# Patient Record
Sex: Male | Born: 1940
Health system: Southern US, Community
[De-identification: ages and names within clinical notes are randomized; demographics above are authoritative.]

## PROBLEM LIST (undated history)

## (undated) DIAGNOSIS — N402 Nodular prostate without lower urinary tract symptoms: Secondary | ICD-10-CM

## (undated) DIAGNOSIS — Z89619 Acquired absence of unspecified leg above knee: Secondary | ICD-10-CM

## (undated) DIAGNOSIS — B1920 Unspecified viral hepatitis C without hepatic coma: Secondary | ICD-10-CM

## (undated) DIAGNOSIS — S065X9A Traumatic subdural hemorrhage with loss of consciousness of unspecified duration, initial encounter: Secondary | ICD-10-CM

## (undated) DIAGNOSIS — Z87442 Personal history of urinary calculi: Secondary | ICD-10-CM

## (undated) DIAGNOSIS — Z8619 Personal history of other infectious and parasitic diseases: Secondary | ICD-10-CM

## (undated) DIAGNOSIS — Z955 Presence of coronary angioplasty implant and graft: Secondary | ICD-10-CM

## (undated) DIAGNOSIS — K254 Chronic or unspecified gastric ulcer with hemorrhage: Secondary | ICD-10-CM

## (undated) DIAGNOSIS — R945 Abnormal results of liver function studies: Secondary | ICD-10-CM

## (undated) DIAGNOSIS — Z720 Tobacco use: Secondary | ICD-10-CM

## (undated) DIAGNOSIS — I219 Acute myocardial infarction, unspecified: Secondary | ICD-10-CM

## (undated) DIAGNOSIS — I10 Essential (primary) hypertension: Secondary | ICD-10-CM

## (undated) DIAGNOSIS — I714 Abdominal aortic aneurysm, without rupture: Secondary | ICD-10-CM

## (undated) DIAGNOSIS — S301XXA Contusion of abdominal wall, initial encounter: Secondary | ICD-10-CM

## (undated) HISTORY — DX: Unspecified viral hepatitis C without hepatic coma: B19.20

## (undated) HISTORY — DX: Personal history of other infectious and parasitic diseases: Z86.19

## (undated) HISTORY — DX: Nodular prostate without lower urinary tract symptoms: N40.2

## (undated) HISTORY — DX: Abdominal aortic aneurysm, without rupture: I71.4

## (undated) HISTORY — DX: Traumatic subdural hemorrhage with loss of consciousness of unspecified duration, initial encounter: S06.5X9A

## (undated) HISTORY — DX: Personal history of urinary calculi: Z87.442

## (undated) HISTORY — DX: Tobacco use: Z72.0

## (undated) HISTORY — DX: Acute myocardial infarction, unspecified: I21.9

## (undated) HISTORY — DX: Chronic or unspecified gastric ulcer with hemorrhage: K25.4

---
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
## 1963-06-03 HISTORY — PX: ABOVE KNEE LEG AMPUTATION: SUR20

## 2005-04-22 ENCOUNTER — Ambulatory Visit: Payer: Self-pay | Admitting: Gastroenterology

## 2006-06-02 DIAGNOSIS — I714 Abdominal aortic aneurysm, without rupture, unspecified: Secondary | ICD-10-CM

## 2006-06-02 HISTORY — DX: Abdominal aortic aneurysm, without rupture, unspecified: I71.40

## 2006-06-02 HISTORY — DX: Abdominal aortic aneurysm, without rupture: I71.4

## 2007-06-03 HISTORY — PX: COLONOSCOPY: SHX174

## 2010-06-02 DIAGNOSIS — B1920 Unspecified viral hepatitis C without hepatic coma: Secondary | ICD-10-CM

## 2010-06-02 HISTORY — DX: Unspecified viral hepatitis C without hepatic coma: B19.20

## 2011-04-16 ENCOUNTER — Other Ambulatory Visit: Payer: Self-pay | Admitting: Family Medicine

## 2011-04-16 DIAGNOSIS — I719 Aortic aneurysm of unspecified site, without rupture: Secondary | ICD-10-CM

## 2011-04-18 ENCOUNTER — Ambulatory Visit
Admission: RE | Admit: 2011-04-18 | Discharge: 2011-04-18 | Disposition: A | Discharge status: Home / Self Care | Payer: Medicare Other | Source: Ambulatory Visit | Attending: Family Medicine | Admitting: Family Medicine

## 2011-04-18 DIAGNOSIS — I719 Aortic aneurysm of unspecified site, without rupture: Secondary | ICD-10-CM

## 2011-04-18 MED ORDER — IOHEXOL 300 MG/ML  SOLN
100.0000 mL | Freq: Once | INTRAMUSCULAR | Status: AC | PRN
  Administered 2011-04-18: 100 mL via INTRAVENOUS

## 2011-09-21 ENCOUNTER — Other Ambulatory Visit: Payer: Self-pay

## 2011-09-21 ENCOUNTER — Encounter (HOSPITAL_COMMUNITY)
Admission: EM | Disposition: A | Discharge status: Home / Self Care | Payer: Self-pay | Source: Home / Self Care | Attending: Cardiovascular Disease

## 2011-09-21 ENCOUNTER — Ambulatory Visit (HOSPITAL_COMMUNITY): Admit: 2011-09-21 | Payer: Self-pay | Admitting: Cardiovascular Disease

## 2011-09-21 ENCOUNTER — Encounter (HOSPITAL_COMMUNITY): Payer: Self-pay | Admitting: *Deleted

## 2011-09-21 ENCOUNTER — Inpatient Hospital Stay (HOSPITAL_COMMUNITY)
Admission: EM | Admit: 2011-09-21 | Discharge: 2011-09-27 | DRG: 246 | Disposition: A | Discharge status: Home / Self Care | Payer: Medicare Other | Attending: Cardiovascular Disease | Admitting: Cardiovascular Disease

## 2011-09-21 DIAGNOSIS — I2542 Coronary artery dissection: Secondary | ICD-10-CM | POA: Diagnosis not present

## 2011-09-21 DIAGNOSIS — R509 Fever, unspecified: Secondary | ICD-10-CM | POA: Diagnosis not present

## 2011-09-21 DIAGNOSIS — I2109 ST elevation (STEMI) myocardial infarction involving other coronary artery of anterior wall: Principal | ICD-10-CM | POA: Diagnosis present

## 2011-09-21 DIAGNOSIS — E785 Hyperlipidemia, unspecified: Secondary | ICD-10-CM | POA: Diagnosis present

## 2011-09-21 DIAGNOSIS — I959 Hypotension, unspecified: Secondary | ICD-10-CM | POA: Diagnosis not present

## 2011-09-21 DIAGNOSIS — F17200 Nicotine dependence, unspecified, uncomplicated: Secondary | ICD-10-CM | POA: Diagnosis not present

## 2011-09-21 DIAGNOSIS — I2510 Atherosclerotic heart disease of native coronary artery without angina pectoris: Secondary | ICD-10-CM | POA: Diagnosis not present

## 2011-09-21 DIAGNOSIS — Z7982 Long term (current) use of aspirin: Secondary | ICD-10-CM

## 2011-09-21 DIAGNOSIS — I10 Essential (primary) hypertension: Secondary | ICD-10-CM | POA: Diagnosis present

## 2011-09-21 DIAGNOSIS — IMO0002 Reserved for concepts with insufficient information to code with codable children: Secondary | ICD-10-CM | POA: Diagnosis not present

## 2011-09-21 DIAGNOSIS — I2582 Chronic total occlusion of coronary artery: Secondary | ICD-10-CM | POA: Diagnosis present

## 2011-09-21 DIAGNOSIS — Y9210 Unspecified residential institution as the place of occurrence of the external cause: Secondary | ICD-10-CM | POA: Diagnosis not present

## 2011-09-21 DIAGNOSIS — K219 Gastro-esophageal reflux disease without esophagitis: Secondary | ICD-10-CM | POA: Diagnosis present

## 2011-09-21 DIAGNOSIS — Z720 Tobacco use: Secondary | ICD-10-CM | POA: Diagnosis present

## 2011-09-21 DIAGNOSIS — E876 Hypokalemia: Secondary | ICD-10-CM | POA: Diagnosis not present

## 2011-09-21 DIAGNOSIS — R7309 Other abnormal glucose: Secondary | ICD-10-CM | POA: Diagnosis not present

## 2011-09-21 DIAGNOSIS — Z89619 Acquired absence of unspecified leg above knee: Secondary | ICD-10-CM

## 2011-09-21 DIAGNOSIS — Z79899 Other long term (current) drug therapy: Secondary | ICD-10-CM | POA: Diagnosis not present

## 2011-09-21 DIAGNOSIS — I724 Aneurysm of artery of lower extremity: Secondary | ICD-10-CM | POA: Diagnosis present

## 2011-09-21 DIAGNOSIS — R945 Abnormal results of liver function studies: Secondary | ICD-10-CM | POA: Diagnosis present

## 2011-09-21 DIAGNOSIS — S78119A Complete traumatic amputation at level between unspecified hip and knee, initial encounter: Secondary | ICD-10-CM

## 2011-09-21 DIAGNOSIS — S301XXA Contusion of abdominal wall, initial encounter: Secondary | ICD-10-CM

## 2011-09-21 DIAGNOSIS — I2589 Other forms of chronic ischemic heart disease: Secondary | ICD-10-CM | POA: Diagnosis present

## 2011-09-21 DIAGNOSIS — I255 Ischemic cardiomyopathy: Secondary | ICD-10-CM | POA: Diagnosis present

## 2011-09-21 DIAGNOSIS — I219 Acute myocardial infarction, unspecified: Secondary | ICD-10-CM | POA: Diagnosis not present

## 2011-09-21 DIAGNOSIS — I714 Abdominal aortic aneurysm, without rupture, unspecified: Secondary | ICD-10-CM | POA: Diagnosis present

## 2011-09-21 DIAGNOSIS — R066 Hiccough: Secondary | ICD-10-CM | POA: Diagnosis not present

## 2011-09-21 DIAGNOSIS — B1920 Unspecified viral hepatitis C without hepatic coma: Secondary | ICD-10-CM | POA: Diagnosis present

## 2011-09-21 DIAGNOSIS — I2119 ST elevation (STEMI) myocardial infarction involving other coronary artery of inferior wall: Secondary | ICD-10-CM | POA: Diagnosis not present

## 2011-09-21 DIAGNOSIS — Z955 Presence of coronary angioplasty implant and graft: Secondary | ICD-10-CM

## 2011-09-21 DIAGNOSIS — I213 ST elevation (STEMI) myocardial infarction of unspecified site: Secondary | ICD-10-CM

## 2011-09-21 DIAGNOSIS — R079 Chest pain, unspecified: Secondary | ICD-10-CM | POA: Diagnosis not present

## 2011-09-21 DIAGNOSIS — Y840 Cardiac catheterization as the cause of abnormal reaction of the patient, or of later complication, without mention of misadventure at the time of the procedure: Secondary | ICD-10-CM | POA: Diagnosis not present

## 2011-09-21 HISTORY — DX: Contusion of abdominal wall, initial encounter: S30.1XXA

## 2011-09-21 HISTORY — PX: PERCUTANEOUS CORONARY STENT INTERVENTION (PCI-S): SHX5485

## 2011-09-21 HISTORY — DX: Essential (primary) hypertension: I10

## 2011-09-21 HISTORY — DX: Abnormal results of liver function studies: R94.5

## 2011-09-21 HISTORY — DX: Presence of coronary angioplasty implant and graft: Z95.5

## 2011-09-21 HISTORY — DX: Acquired absence of unspecified leg above knee: Z89.619

## 2011-09-21 HISTORY — PX: LEFT HEART CATHETERIZATION WITH CORONARY ANGIOGRAM: SHX5451

## 2011-09-21 LAB — POCT ACTIVATED CLOTTING TIME: Activated Clotting Time: 144 seconds

## 2011-09-21 LAB — CARDIAC PANEL(CRET KIN+CKTOT+MB+TROPI)
CK, MB: 8.6 ng/mL (ref 0.3–4.0)
Total CK: 142 U/L (ref 7–232)
Troponin I: 0.75 ng/mL (ref <0.30)

## 2011-09-21 LAB — BASIC METABOLIC PANEL
CO2: 24 mEq/L (ref 19–32)
Calcium: 9.4 mg/dL (ref 8.4–10.5)
Creatinine, Ser: 1 mg/dL (ref 0.50–1.35)
GFR calc Af Amer: 85 mL/min — ABNORMAL LOW (ref >90)
GFR calc non Af Amer: 74 mL/min — ABNORMAL LOW (ref >90)
Sodium: 140 mEq/L (ref 135–145)

## 2011-09-21 LAB — CBC
Platelets: 149 10*3/uL — ABNORMAL LOW (ref 150–400)
RBC: 4.76 MIL/uL (ref 4.22–5.81)
RDW: 14.4 % (ref 11.5–15.5)
WBC: 6.6 10*3/uL (ref 4.0–10.5)

## 2011-09-21 LAB — MAGNESIUM: Magnesium: 1.9 mg/dL (ref 1.5–2.5)

## 2011-09-21 SURGERY — LEFT HEART CATHETERIZATION WITH CORONARY ANGIOGRAM
Anesthesia: LOCAL

## 2011-09-21 MED ORDER — MIDAZOLAM HCL 2 MG/2ML IJ SOLN
INTRAMUSCULAR | Status: AC
  Filled 2011-09-21: qty 2

## 2011-09-21 MED ORDER — MORPHINE SULFATE 2 MG/ML IJ SOLN
INTRAMUSCULAR | Status: AC
  Administered 2011-09-21: 2 mg via INTRAVENOUS
  Filled 2011-09-21: qty 1

## 2011-09-21 MED ORDER — SODIUM CHLORIDE 0.9 % IV SOLN
INTRAVENOUS | Status: DC
  Administered 2011-09-21 – 2011-09-22 (×2): via INTRAVENOUS

## 2011-09-21 MED ORDER — NITROGLYCERIN IN D5W 200-5 MCG/ML-% IV SOLN
2.0000 ug/min | INTRAVENOUS | Status: DC
  Administered 2011-09-21: 40 ug/min via INTRAVENOUS
  Filled 2011-09-21: qty 250

## 2011-09-21 MED ORDER — TICAGRELOR 90 MG PO TABS
ORAL_TABLET | ORAL | Status: AC
  Filled 2011-09-21: qty 2

## 2011-09-21 MED ORDER — ALPRAZOLAM 0.25 MG PO TABS
0.2500 mg | ORAL_TABLET | Freq: Three times a day (TID) | ORAL | Status: DC | PRN
  Administered 2011-09-22: 0.25 mg via ORAL
  Filled 2011-09-21: qty 1

## 2011-09-21 MED ORDER — PANTOPRAZOLE SODIUM 40 MG PO TBEC
40.0000 mg | DELAYED_RELEASE_TABLET | Freq: Every day | ORAL | Status: DC
  Administered 2011-09-22: 40 mg via ORAL
  Filled 2011-09-21: qty 1

## 2011-09-21 MED ORDER — CARVEDILOL 3.125 MG PO TABS
3.1250 mg | ORAL_TABLET | Freq: Two times a day (BID) | ORAL | Status: DC
  Administered 2011-09-22 – 2011-09-23 (×4): 3.125 mg via ORAL
  Filled 2011-09-21 (×7): qty 1

## 2011-09-21 MED ORDER — ASPIRIN EC 81 MG PO TBEC: 81.0000 mg | DELAYED_RELEASE_TABLET | Freq: Every day | ORAL | Status: DC

## 2011-09-21 MED ORDER — LIDOCAINE HCL (PF) 1 % IJ SOLN
INTRAMUSCULAR | Status: AC
  Filled 2011-09-21: qty 30

## 2011-09-21 MED ORDER — NITROGLYCERIN IN D5W 200-5 MCG/ML-% IV SOLN
INTRAVENOUS | Status: AC
  Filled 2011-09-21: qty 250

## 2011-09-21 MED ORDER — EPTIFIBATIDE 75 MG/100ML IV SOLN
2.0000 ug/kg/min | INTRAVENOUS | Status: AC
  Administered 2011-09-21 – 2011-09-22 (×3): 2 ug/kg/min via INTRAVENOUS
  Filled 2011-09-21 (×6): qty 100

## 2011-09-21 MED ORDER — ATORVASTATIN CALCIUM 40 MG PO TABS
40.0000 mg | ORAL_TABLET | Freq: Every day | ORAL | Status: DC
  Administered 2011-09-22 – 2011-09-26 (×5): 40 mg via ORAL
  Filled 2011-09-21 (×6): qty 1

## 2011-09-21 MED ORDER — HEPARIN (PORCINE) IN NACL 2-0.9 UNIT/ML-% IJ SOLN
INTRAMUSCULAR | Status: AC
  Filled 2011-09-21: qty 2000

## 2011-09-21 MED ORDER — ACETAMINOPHEN 325 MG PO TABS
650.0000 mg | ORAL_TABLET | ORAL | Status: DC | PRN
  Administered 2011-09-23 – 2011-09-24 (×2): 650 mg via ORAL
  Filled 2011-09-21 (×2): qty 2

## 2011-09-21 MED ORDER — HEPARIN SODIUM (PORCINE) 5000 UNIT/ML IJ SOLN
INTRAMUSCULAR | Status: AC
  Administered 2011-09-21: 15:00:00
  Filled 2011-09-21: qty 1

## 2011-09-21 MED ORDER — BIVALIRUDIN 250 MG IV SOLR
INTRAVENOUS | Status: AC
  Filled 2011-09-21: qty 250

## 2011-09-21 MED ORDER — MORPHINE SULFATE 2 MG/ML IJ SOLN
2.0000 mg | INTRAMUSCULAR | Status: DC | PRN
  Administered 2011-09-21 – 2011-09-22 (×2): 2 mg via INTRAVENOUS
  Filled 2011-09-21 (×2): qty 1

## 2011-09-21 MED ORDER — NITROGLYCERIN 0.2 MG/ML ON CALL CATH LAB
INTRAVENOUS | Status: AC
  Filled 2011-09-21: qty 1

## 2011-09-21 MED ORDER — ADENOSINE 6 MG/2ML IV SOLN
INTRAVENOUS | Status: AC
  Filled 2011-09-21: qty 2

## 2011-09-21 MED ORDER — FENTANYL CITRATE 0.05 MG/ML IJ SOLN
INTRAMUSCULAR | Status: AC
  Filled 2011-09-21: qty 2

## 2011-09-21 MED ORDER — ATROPINE SULFATE 1 MG/ML IJ SOLN
INTRAMUSCULAR | Status: AC
  Filled 2011-09-21: qty 1

## 2011-09-21 MED ORDER — ONDANSETRON HCL 4 MG/2ML IJ SOLN
4.0000 mg | Freq: Four times a day (QID) | INTRAMUSCULAR | Status: DC | PRN
  Administered 2011-09-21: 4 mg via INTRAVENOUS
  Filled 2011-09-21: qty 2

## 2011-09-21 MED ORDER — ZOLPIDEM TARTRATE 5 MG PO TABS
5.0000 mg | ORAL_TABLET | Freq: Every evening | ORAL | Status: DC | PRN
  Administered 2011-09-22: 5 mg via ORAL
  Filled 2011-09-21: qty 1

## 2011-09-21 MED ORDER — MORPHINE SULFATE 2 MG/ML IJ SOLN: 2.0000 mg | Freq: Once | INTRAMUSCULAR | Status: DC

## 2011-09-21 MED ORDER — EPTIFIBATIDE 75 MG/100ML IV SOLN
INTRAVENOUS | Status: AC
  Administered 2011-09-22: 2 ug/kg/min via INTRAVENOUS
  Filled 2011-09-21: qty 100

## 2011-09-21 MED ORDER — ATORVASTATIN CALCIUM 80 MG PO TABS: 80.0000 mg | ORAL_TABLET | Freq: Every day | ORAL | Status: DC

## 2011-09-21 MED ORDER — TICAGRELOR 90 MG PO TABS
90.0000 mg | ORAL_TABLET | Freq: Two times a day (BID) | ORAL | Status: DC
  Administered 2011-09-21: 90 mg via ORAL
  Filled 2011-09-21 (×2): qty 1

## 2011-09-21 MED ORDER — ADULT MULTIVITAMIN W/MINERALS CH
1.0000 | ORAL_TABLET | Freq: Every day | ORAL | Status: DC
  Administered 2011-09-22 – 2011-09-27 (×6): 1 via ORAL
  Filled 2011-09-21 (×7): qty 1

## 2011-09-21 MED ORDER — ASPIRIN EC 81 MG PO TBEC
81.0000 mg | DELAYED_RELEASE_TABLET | Freq: Every day | ORAL | Status: DC
  Administered 2011-09-21 – 2011-09-26 (×6): 81 mg via ORAL
  Filled 2011-09-21 (×7): qty 1

## 2011-09-21 MED ORDER — POTASSIUM CHLORIDE 10 MEQ/100ML IV SOLN
10.0000 meq | INTRAVENOUS | Status: AC
  Administered 2011-09-21 (×4): 10 meq via INTRAVENOUS
  Filled 2011-09-21 (×4): qty 100

## 2011-09-21 NOTE — ED Provider Notes (Signed)
History     CSN: [id number]  Arrival date & time 09/21/11  37   First MD Initiated Contact with Patient 09/21/11 1432      Chief Complaint  Patient presents with  . Code STEMI    (Consider location/radiation/quality/duration/timing/severity/associated sxs/prior treatment) The history is provided by the patient.   71 year old male had onset about 30 minutes ago of moderately severe, dull midsternal chest pain without radiation. There was no associated dyspnea, nausea, vomiting, or diaphoresis. There is no radiation of pain. He has not had pain like this before. Nothing makes it better nothing makes it worse. He does have a history of an abdominal aortic aneurysm but is a nonsmoker and does not have history of diabetes you. He is treated for hypertension. He took 4 baby aspirin at home before coming into the emergency department.  Past Medical History  Diagnosis Date  . Acid reflux   . Hypertension     History reviewed. No pertinent past surgical history.  History reviewed. No pertinent family history.  History  Substance Use Topics  . Smoking status: Current Everyday Smoker    Types: Cigars  . Smokeless tobacco: Not on file  . Alcohol Use: No      Review of Systems  All other systems reviewed and are negative.    Allergies  Review of patient's allergies indicates no known allergies.  Home Medications   Current Outpatient Rx  Name Route Sig Dispense Refill  . ASPIRIN EC 81 MG PO TBEC Oral Take 81 mg by mouth at bedtime.    . ADULT MULTIVITAMIN W/MINERALS CH Oral Take 1 tablet by mouth daily.    . NEBIVOLOL HCL 5 MG PO TABS Oral Take 5 mg by mouth daily.    [name] OMEPRAZOLE 20 MG PO CPDR Oral Take 20 mg by mouth daily.      BP 161/88  Pulse 66  Temp(Src) 97.7 F (36.5 C) (Oral)  Resp 18  SpO2 100%  Physical Exam  Nursing note and vitals reviewed.  71 year old male who is resting comfortably and in no acute distress. Vital signs are significant for moderate  hypertension with blood pressure 161/88. Oxygen saturation is 100% which is normal. Head is normocephalic and atraumatic. PERRLA, EOMI. Oropharynx is clear. Neck is nontender and supple. Back is nontender. Lungs are clear without rales, wheezes, rhonchi. Heart has regular rate rhythm without murmur. Abdomen is soft, flat, nontender without masses or hepatosplenomegaly. Extremities: He is status post right above-the-knee amputation. No cyanosis or edema. Skin has some areas of erythema and scaling consistent with psoriasis. Neurologic: Mental status is normal, cranial nerves are intact, there no focal motor or sensory deficits.  ED Course  Procedures (including critical care time)  Labs Reviewed - No data to display No results found.   Date: 09/21/2011  Rate: 64  Rhythm: normal sinus rhythm  QRS Axis: normal  Intervals: normal  ST/T Wave abnormalities: ST elevations anteriorly and ST elevations laterally  Conduction Disutrbances:right bundle branch block  Narrative Interpretation:   Old EKG Reviewed: none available    1. STEMI (ST elevation myocardial infarction)    CRITICAL CARE Performed by: [id number]   Total critical care time: 35 minutes  Critical care time was exclusive of separately billable procedures and treating other patients.  Critical care was necessary to treat or prevent imminent or life-threatening deterioration.  Critical care was time spent personally by me on the following activities: development of treatment plan with patient and/or surrogate as well as nursing,  discussions with consultants, evaluation of patient's response to treatment, examination of patient, obtaining history from patient or surrogate, ordering and performing treatments and interventions, ordering and review of laboratory studies, ordering and review of radiographic studies, pulse oximetry and re-evaluation of patient's condition.    MDM  Acute STEMI. Code STEMI was activated. Dr. Claiborne Billings has  been by to see the patient in he was taken directly up to the cath lab. Prior to transfer to the cath lab, he was given heparin IV bolus. Monitor showed frequent PVCs but no other arrhythmias.      Delora Fuel, MD [id number]

## 2011-09-21 NOTE — ED Notes (Signed)
Patient to room 17,  Cardiology dr Claiborne Billings has been to bedside with ermd.  Patient undressed, iv access obtained.  Patient placed on zoll and cardiac monitoring.  Wife at bedside and aware of plan of care.

## 2011-09-21 NOTE — Progress Notes (Signed)
Chaplain responded to a Code STEMI. Chaplain escorted patient's wife to a consultation and offered her emotional support. Patient's wife stated that she is coping okay. Chaplain also connected patient's wife with medical staff. Follow up as needed.

## 2011-09-21 NOTE — CV Procedure (Addendum)
Emergent cath/ Very difficult PCI/PTCA and multiple stents  STEMI Anterior  Stephen [name], 71 y.o., male  See dictated note and diagram in chart.  LM: short nl LAD;total occlusion post Dx1 and septal, [place] ) flow LCX large dominent RCA: diffuse disease, 50 - 70%  Ao: 129/72 LV: 129/6/18  Very difficult PCI LAD. Once occlusion opened, multiple stenosis.  Initial 2.75 x22 Resolute stent inserted at site post dilated to 3.0.  Distal dissection near apex noted difficulty reaching distal aspect but stented with 2.25 x 22 Resolute stent. Due to dissection extending antegrade required stenting with 2 additional 2.75 mm x 22 Resolute stents.  All stents post dilated but flow remained decreased.   Adenosine given at 24 and 36 micrograms.  Flow remained decreased.  Integrelin started.  Will continue integrelin for 24 - 36 hrs. Medical therapy.  DICTATION [id number]  , [id number]  Troy Sine, MD, [facility] 09/21/2011 7:21 PM

## 2011-09-21 NOTE — Progress Notes (Signed)
ANTICOAGULATION CONSULT NOTE - Initial Consult  Pharmacy Consult for Integrilin Indication: s/p PTCA LAD requiring multiple stents  No Known Allergies  Patient Measurements: Weight: 149 lb 14.6 oz (68 kg)  Vital Signs: Temp: 97.7 F (36.5 C) (04/21 1426) Temp src: Oral (04/21 1426) BP: 161/88 mmHg (04/21 1450) Pulse Rate: 96  (04/21 1507)  Labs:  Basename 09/21/11 1737 09/21/11 1730  HGB -- 16.3  HCT -- 45.8  PLT -- 149*  APTT -- --  LABPROT -- --  INR -- --  HEPARINUNFRC -- --  CREATININE -- 1.00  CKTOTAL 142 --  CKMB 8.6* --  TROPONINI 0.75* --   Creat Clearance ~ 70 ml/min  Medical History: Past Medical History  Diagnosis Date  . Acid reflux   . Hypertension     Medications:  Prescriptions prior to admission  Medication Sig Dispense Refill  . aspirin EC 81 MG tablet Take 81 mg by mouth at bedtime.      . Multiple Vitamin (MULITIVITAMIN WITH MINERALS) TABS Take 1 tablet by mouth daily.      . nebivolol (BYSTOLIC) 5 MG tablet Take 5 mg by mouth daily.      [name] omeprazole (PRILOSEC) 20 MG capsule Take 20 mg by mouth daily.        Assessment: 71 y.o. male STEMI s/p emergent PTCA of LAD requiring multiple stents. Integrilin x 36hr per Rx post PTCA LAD-required multiple stents. Also on ticagrelor BID and ASA 81mg . Hgb 16.3. Plt 149.   Plan:  1. Continue integrilin at 2 mcg/kg/min x 36 hours 2. F/u a.m. platelet  Sherlon Handing, PharmD, BCPS Clinical pharmacist, pager [phone] 09/21/2011,7:51 PM
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
## 2011-09-21 NOTE — ED Notes (Signed)
Pt had onset of chest pain 20 mins ago. ekg done at triage, code STEMI called. No resp distress noted, skin w/d.

## 2011-09-22 ENCOUNTER — Encounter (HOSPITAL_COMMUNITY): Payer: Self-pay | Admitting: Cardiology

## 2011-09-22 ENCOUNTER — Inpatient Hospital Stay (HOSPITAL_COMMUNITY): Payer: Medicare Other

## 2011-09-22 DIAGNOSIS — I2510 Atherosclerotic heart disease of native coronary artery without angina pectoris: Secondary | ICD-10-CM | POA: Diagnosis not present

## 2011-09-22 DIAGNOSIS — Z955 Presence of coronary angioplasty implant and graft: Secondary | ICD-10-CM

## 2011-09-22 DIAGNOSIS — I2119 ST elevation (STEMI) myocardial infarction involving other coronary artery of inferior wall: Secondary | ICD-10-CM | POA: Diagnosis not present

## 2011-09-22 DIAGNOSIS — Z720 Tobacco use: Secondary | ICD-10-CM

## 2011-09-22 DIAGNOSIS — F17200 Nicotine dependence, unspecified, uncomplicated: Secondary | ICD-10-CM | POA: Diagnosis not present

## 2011-09-22 DIAGNOSIS — K219 Gastro-esophageal reflux disease without esophagitis: Secondary | ICD-10-CM | POA: Diagnosis present

## 2011-09-22 DIAGNOSIS — Z89619 Acquired absence of unspecified leg above knee: Secondary | ICD-10-CM

## 2011-09-22 DIAGNOSIS — I219 Acute myocardial infarction, unspecified: Secondary | ICD-10-CM | POA: Diagnosis not present

## 2011-09-22 DIAGNOSIS — I724 Aneurysm of artery of lower extremity: Secondary | ICD-10-CM | POA: Diagnosis not present

## 2011-09-22 DIAGNOSIS — I2542 Coronary artery dissection: Secondary | ICD-10-CM | POA: Diagnosis not present

## 2011-09-22 DIAGNOSIS — IMO0002 Reserved for concepts with insufficient information to code with codable children: Secondary | ICD-10-CM | POA: Diagnosis not present

## 2011-09-22 DIAGNOSIS — R7989 Other specified abnormal findings of blood chemistry: Secondary | ICD-10-CM

## 2011-09-22 DIAGNOSIS — I2109 ST elevation (STEMI) myocardial infarction involving other coronary artery of anterior wall: Secondary | ICD-10-CM | POA: Diagnosis not present

## 2011-09-22 DIAGNOSIS — R945 Abnormal results of liver function studies: Secondary | ICD-10-CM

## 2011-09-22 DIAGNOSIS — S301XXA Contusion of abdominal wall, initial encounter: Secondary | ICD-10-CM

## 2011-09-22 HISTORY — DX: Acquired absence of unspecified leg above knee: Z89.619

## 2011-09-22 HISTORY — DX: Abnormal results of liver function studies: R94.5

## 2011-09-22 HISTORY — DX: Tobacco use: Z72.0

## 2011-09-22 HISTORY — DX: Presence of coronary angioplasty implant and graft: Z95.5

## 2011-09-22 HISTORY — DX: Other specified abnormal findings of blood chemistry: R79.89

## 2011-09-22 HISTORY — DX: Contusion of abdominal wall, initial encounter: S30.1XXA

## 2011-09-22 LAB — HEPATIC FUNCTION PANEL
ALT: 106 U/L — ABNORMAL HIGH (ref 0–53)
AST: 418 U/L — ABNORMAL HIGH (ref 0–37)
Albumin: 3.3 g/dL — ABNORMAL LOW (ref 3.5–5.2)
Alkaline Phosphatase: 73 U/L (ref 39–117)
Bilirubin, Direct: 0.2 mg/dL (ref 0.0–0.3)
Total Bilirubin: 0.5 mg/dL (ref 0.3–1.2)

## 2011-09-22 LAB — BASIC METABOLIC PANEL
CO2: 22 mEq/L (ref 19–32)
Calcium: 8.4 mg/dL (ref 8.4–10.5)
Creatinine, Ser: 0.9 mg/dL (ref 0.50–1.35)
GFR calc non Af Amer: 84 mL/min — ABNORMAL LOW (ref >90)

## 2011-09-22 LAB — CBC
HCT: 35 % — ABNORMAL LOW (ref 39.0–52.0)
Hemoglobin: 12.6 g/dL — ABNORMAL LOW (ref 13.0–17.0)
MCH: 33.4 pg (ref 26.0–34.0)
MCH: 33.3 pg (ref 26.0–34.0)
MCHC: 35.9 g/dL (ref 30.0–36.0)
MCHC: 36 g/dL (ref 30.0–36.0)
MCV: 93.1 fL (ref 78.0–100.0)
MCV: 92.6 fL (ref 78.0–100.0)
Platelets: 142 10*3/uL — ABNORMAL LOW (ref 150–400)
Platelets: 128 10*3/uL — ABNORMAL LOW (ref 150–400)
RBC: 4.22 MIL/uL (ref 4.22–5.81)
RBC: 3.78 MIL/uL — ABNORMAL LOW (ref 4.22–5.81)
RDW: 14.4 % (ref 11.5–15.5)
WBC: 7.7 10*3/uL (ref 4.0–10.5)

## 2011-09-22 LAB — CARDIAC PANEL(CRET KIN+CKTOT+MB+TROPI): Total CK: 2637 U/L — ABNORMAL HIGH (ref 7–232)

## 2011-09-22 LAB — POCT ACTIVATED CLOTTING TIME: Activated Clotting Time: 766 seconds

## 2011-09-22 LAB — TSH: TSH: 0.194 u[IU]/mL — ABNORMAL LOW (ref 0.350–4.500)

## 2011-09-22 LAB — HEMOGLOBIN A1C: Hgb A1c MFr Bld: 5.1 % (ref <5.7)

## 2011-09-22 MED ORDER — SODIUM CHLORIDE 0.9 % IV SOLN
INTRAVENOUS | Status: DC
  Administered 2011-09-22: 22:00:00 via INTRAVENOUS

## 2011-09-22 MED ORDER — ALUM & MAG HYDROXIDE-SIMETH 200-200-20 MG/5ML PO SUSP
30.0000 mL | Freq: Once | ORAL | Status: AC
  Administered 2011-09-22: 30 mL via ORAL
  Filled 2011-09-22: qty 30

## 2011-09-22 MED ORDER — TICAGRELOR 90 MG PO TABS
90.0000 mg | ORAL_TABLET | Freq: Two times a day (BID) | ORAL | Status: DC
  Administered 2011-09-22 – 2011-09-27 (×11): 90 mg via ORAL
  Filled 2011-09-22 (×13): qty 1

## 2011-09-22 MED ORDER — PANTOPRAZOLE SODIUM 40 MG PO TBEC
40.0000 mg | DELAYED_RELEASE_TABLET | Freq: Two times a day (BID) | ORAL | Status: DC
  Administered 2011-09-23 – 2011-09-27 (×9): 40 mg via ORAL
  Filled 2011-09-22 (×9): qty 1

## 2011-09-22 MED FILL — Dextrose Inj 5%: INTRAVENOUS | Qty: 50 | Status: AC
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
## 2011-09-22 NOTE — Progress Notes (Signed)
Pressure drsg removed from Lt femoral site. Marland KitchenApprox 5cm x 8cm hematoma noted w/fresh bloody drainage from gaping incision .Manual pressure held over and above the hematoma x 30 min. Incision covered w/rolled 2x2 gauze, then the entire bruised area(outlined now w/black marker) covered tightly w/3 Lg tegaderm .Another Lg tegaderm placed over outpouched(yet very soft) area between the femoral site incision and the scrotum.Charge nurse made aware. Maalox given to Pt for c/o freq burping which shakes his abd .Small rolled blanket still at Pt's side( for splinting site before coughing).Reassurance provided.Pt pleasant and conversive.Pt to be monitored q 10- 15 min .

## 2011-09-22 NOTE — Progress Notes (Signed)
  Echocardiogram 2D Echocardiogram has been performed.  Stephen [name] Deneen 09/22/2011, 10:21 AM

## 2011-09-22 NOTE — Progress Notes (Signed)
Cardiac Rehab 1210 Noted trop elevated and watching groin. Discussed with pt to have wife bring prosthesis to hosp as it is not here. Will followup tomorrow to see if pt ready to begin ambulation. Sheriff Rodenberg DunlapRN

## 2011-09-22 NOTE — Progress Notes (Signed)
Pt smokes cigars and doesn't believe cigars can be inhaled. Discussed the role of light smoking and 2nd hand smoke as well as surgeon general's warning about there being no safe level of exposure to cigar or cigarette smoke and that if he's breathing he's inhaling. Pt in precontemplation stage and refused written education/contact information.

## 2011-09-22 NOTE — Progress Notes (Signed)
Lt femoral arterial sheath d/c'd per protocol.The patient tol well . Manual pressure held x 30 min then pressure drsg applied. Plan of care ,activity and routine at-home-care of site reviewed w/Pt . Pt declined to watch cardiac cath and other teaching videos this shift . Pt again reminded not to lift or reposition hips or LLE x 6 hrs .Dark bruise,approx 1.5 cm wide x .5 cm vertically, around gaping incision .Skin to Lt femoral site appears supple w/no additional bruising .

## 2011-09-22 NOTE — Progress Notes (Signed)
Jana Half made aware or Lt femoral site assessment. Order for stat CBC received.

## 2011-09-22 NOTE — Progress Notes (Signed)
   CARE MANAGEMENT NOTE 09/22/2011  Patient:  Stephen [name], Stephen [name]   Account Number:  [id number]  Date Initiated:  09/22/2011  Documentation initiated by:  GRAVES-BIGELOW,Shanah Guimaraes  Subjective/Objective Assessment:   Pt admitted with CP-STEMI-SP urgent cath-  s/p PTCA LAD requiring multiple stents. Pt is from home with family support.  Pt plan for d/c on brilinta when stable. CM to assist with medication.     Action/Plan:   CM did give pt 30 day free brilinta card. Pt uses [facility] on [address] medication is available. MD please write Rx for 30 day free no refills along with original Rx with refills. Thanks   Anticipated DC Date:  09/24/2011   Anticipated DC Plan:  [place]  CM consult  Medication Assistance      Choice offered to / List presented to:             Status of service:  Completed, signed off Medicare Important Message given?   (If response is "NO", the following Medicare IM given date fields will be blank) Date Medicare IM given:   Date Additional Medicare IM given:    Discharge Disposition:    Per UR Regulation:    If discussed at Long Length of Stay Meetings, dates discussed:    Comments:  09-22-11 1208 Jacqlyn Krauss, RN,BSN [phone] Benefits check in process- will make pt aware when complete.

## 2011-09-22 NOTE — Progress Notes (Signed)
UR Completed. Simmons, Kierrah Kilbride F [phone]  

## 2011-09-22 NOTE — Plan of Care (Signed)
Problem: Consults Goal: Chest Pain Patient Education (See Patient Education module for education specifics.) Outcome: Progressing Declined videos last noc  Problem: Phase I Progression Outcomes Goal: Anginal pain relieved Outcome: Progressing NTG drip at 5/mcg/min

## 2011-09-23 ENCOUNTER — Encounter (HOSPITAL_COMMUNITY): Payer: Self-pay

## 2011-09-23 DIAGNOSIS — E785 Hyperlipidemia, unspecified: Secondary | ICD-10-CM | POA: Diagnosis present

## 2011-09-23 DIAGNOSIS — I219 Acute myocardial infarction, unspecified: Secondary | ICD-10-CM | POA: Diagnosis not present

## 2011-09-23 DIAGNOSIS — IMO0002 Reserved for concepts with insufficient information to code with codable children: Secondary | ICD-10-CM | POA: Diagnosis not present

## 2011-09-23 DIAGNOSIS — I2542 Coronary artery dissection: Secondary | ICD-10-CM | POA: Diagnosis not present

## 2011-09-23 DIAGNOSIS — I2510 Atherosclerotic heart disease of native coronary artery without angina pectoris: Secondary | ICD-10-CM | POA: Diagnosis not present

## 2011-09-23 DIAGNOSIS — I255 Ischemic cardiomyopathy: Secondary | ICD-10-CM | POA: Diagnosis present

## 2011-09-23 DIAGNOSIS — I2119 ST elevation (STEMI) myocardial infarction involving other coronary artery of inferior wall: Secondary | ICD-10-CM | POA: Diagnosis not present

## 2011-09-23 DIAGNOSIS — I2109 ST elevation (STEMI) myocardial infarction involving other coronary artery of anterior wall: Secondary | ICD-10-CM | POA: Diagnosis not present

## 2011-09-23 LAB — LIPID PANEL
Cholesterol: 153 mg/dL (ref 0–200)
LDL Cholesterol: 105 mg/dL — ABNORMAL HIGH (ref 0–99)
Total CHOL/HDL Ratio: 4.5 RATIO
VLDL: 14 mg/dL (ref 0–40)

## 2011-09-23 LAB — CBC
HCT: 33.8 % — ABNORMAL LOW (ref 39.0–52.0)
Hemoglobin: 12 g/dL — ABNORMAL LOW (ref 13.0–17.0)
RBC: 3.57 MIL/uL — ABNORMAL LOW (ref 4.22–5.81)
WBC: 7.3 10*3/uL (ref 4.0–10.5)

## 2011-09-23 LAB — HEPATIC FUNCTION PANEL
ALT: 76 U/L — ABNORMAL HIGH (ref 0–53)
AST: 270 U/L — ABNORMAL HIGH (ref 0–37)
Albumin: 2.9 g/dL — ABNORMAL LOW (ref 3.5–5.2)
Alkaline Phosphatase: 63 U/L (ref 39–117)
Total Bilirubin: 1.5 mg/dL — ABNORMAL HIGH (ref 0.3–1.2)

## 2011-09-23 LAB — BASIC METABOLIC PANEL
CO2: 24 mEq/L (ref 19–32)
Chloride: 107 mEq/L (ref 96–112)
GFR calc Af Amer: >90 mL/min (ref >90)
Potassium: 3.7 mEq/L (ref 3.5–5.1)

## 2011-09-23 MED ORDER — POTASSIUM CHLORIDE CRYS ER 20 MEQ PO TBCR
20.0000 meq | EXTENDED_RELEASE_TABLET | Freq: Once | ORAL | Status: AC
  Administered 2011-09-23: 20 meq via ORAL
  Filled 2011-09-23: qty 1

## 2011-09-23 MED ORDER — SODIUM CHLORIDE 0.9 % IV SOLN: INTRAVENOUS | Status: DC | PRN
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
## 2011-09-23 NOTE — Progress Notes (Signed)
CARDIAC REHAB PHASE I   PRE:  Rate/Rhythm: 110 ST    BP: sitting 105/65    SaO2: 97  RA  MODE:  Ambulation: 150 ft   POST:  Rate/Rhythm: 130 ST (while putting on prosthesis), 124 ST after walk    BP: sitting 95/63     SaO2:   Pt HR up with activity. Required increased exertion to done prosthesis. HR to 130 ST. Pt used RW and assist x1 in hallway. Has awkward gait that he has had all his life due to amputation. Walked slower than normal. Sts he is not used to [employer]. At times got too far away or crooked in RW. Not sure yet if pt should use RW at D/C. Will eval more later. Return to recliner. Pt denies SOB or fatigue walking however I thought he was fatiguing toward end (not staying close to RW, pushing it out in front of him). Began MI ed. Pt very motivated to do what it takes to live and have his quality of life. Wife present. Will f/u. [id number]  Darrick Meigs CES, ACSM

## 2011-09-24 DIAGNOSIS — I724 Aneurysm of artery of lower extremity: Secondary | ICD-10-CM | POA: Diagnosis not present

## 2011-09-24 DIAGNOSIS — I2119 ST elevation (STEMI) myocardial infarction involving other coronary artery of inferior wall: Secondary | ICD-10-CM | POA: Diagnosis not present

## 2011-09-24 LAB — CBC
HCT: 32.9 % — ABNORMAL LOW (ref 39.0–52.0)
Hemoglobin: 11.8 g/dL — ABNORMAL LOW (ref 13.0–17.0)
MCH: 34.3 pg — ABNORMAL HIGH (ref 26.0–34.0)
MCHC: 35.9 g/dL (ref 30.0–36.0)
MCV: 95.6 fL (ref 78.0–100.0)
Platelets: 85 10*3/uL — ABNORMAL LOW (ref 150–400)
RBC: 3.44 MIL/uL — ABNORMAL LOW (ref 4.22–5.81)
RDW: 14.1 % (ref 11.5–15.5)
WBC: 5.9 10*3/uL (ref 4.0–10.5)

## 2011-09-24 LAB — BASIC METABOLIC PANEL
BUN: 13 mg/dL (ref 6–23)
CO2: 24 mEq/L (ref 19–32)
Calcium: 8.5 mg/dL (ref 8.4–10.5)
Chloride: 105 mEq/L (ref 96–112)
Creatinine, Ser: 1.21 mg/dL (ref 0.50–1.35)
GFR calc Af Amer: 68 mL/min — ABNORMAL LOW (ref >90)
GFR calc non Af Amer: 58 mL/min — ABNORMAL LOW (ref >90)
Glucose, Bld: 95 mg/dL (ref 70–99)
Potassium: 3.9 mEq/L (ref 3.5–5.1)
Sodium: 137 mEq/L (ref 135–145)
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
## 2011-09-24 NOTE — Progress Notes (Signed)
VASCULAR LAB PRELIMINARY  PRELIMINARY  PRELIMINARY  PRELIMINARY  Left groin ultrasound completed.    Preliminary report:  Small active area of mostly thrombosed pseudoaneurysm.  Long neck.  Judithann Sauger, RVT 09/24/2011, 1:06 PM

## 2011-09-24 NOTE — Progress Notes (Signed)
CARDIAC REHAB PHASE I   PRE:  Rate/Rhythm: 116 ST donning prosthesis    BP: sitting 113/53    SaO2:   MODE:  Ambulation: 200 ft   POST:  Rate/Rhythm: 118 ST    BP: sitting 115/61     SaO2: 97 RA  Pt ambulated 200 feet assist x2, handheld assist. Did not use RW and did ok. Pt can walk this evening with wife. HR to 118 ST. Sts he feels good. Sts he is more SOB moving around room than when he walks. To recliner. Will f/u. [id number]  Darrick Meigs CES, ACSM

## 2011-09-24 NOTE — Progress Notes (Signed)
Cardiac Rehab 1145 Came to walk with pt. Pt would like to wait until after lunch. Was up with son to bathroom earlier. Will return after lunch. Magdelene Ruark DunlapRN

## 2011-09-25 DIAGNOSIS — I2119 ST elevation (STEMI) myocardial infarction involving other coronary artery of inferior wall: Secondary | ICD-10-CM | POA: Diagnosis not present

## 2011-09-25 NOTE — Progress Notes (Signed)
Pt. Seen and examined. Agree with the NP/PA-C note as written.  Feels great today. Has had a few low grade temps, but cultures are unrevealing. Ambulating okay, but tires easily.  Plan for LifeVest, possibly today. Unable to add ACE-I or B-blocker due to hypotension at this point. Will evaluate progress today, possibly okay for d/c home tomorrow.   Pixie Casino, MD, [facility] Attending Cardiologist [facility]

## 2011-09-25 NOTE — Progress Notes (Signed)
Stephen [name] in to do [employer]

## 2011-09-25 NOTE — Progress Notes (Signed)
CARDIAC REHAB PHASE I   PRE:  Rate/Rhythm: 82 SR  BP:  Supine: 99/61  Sitting:   Standing:    SaO2: 99 RA  MODE:  Ambulation: 300 ft   POST:  Rate/Rhythem: 109 ST  BP:  Supine:   Sitting: 110/62  Standing:    SaO2: 99 RA 1145-1215 Assisted X 1 to ambulate. Pt has limp from prothesis, but able to walk 300 feet without c/o of cp or SOB. VS stable. To recliner after walk with call light in reach.  Deon Pilling
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
## 2011-09-26 ENCOUNTER — Other Ambulatory Visit: Payer: Self-pay

## 2011-09-26 DIAGNOSIS — I2119 ST elevation (STEMI) myocardial infarction involving other coronary artery of inferior wall: Secondary | ICD-10-CM | POA: Diagnosis not present

## 2011-09-26 DIAGNOSIS — IMO0002 Reserved for concepts with insufficient information to code with codable children: Secondary | ICD-10-CM | POA: Diagnosis not present

## 2011-09-26 DIAGNOSIS — R509 Fever, unspecified: Secondary | ICD-10-CM | POA: Diagnosis not present

## 2011-09-26 DIAGNOSIS — I2542 Coronary artery dissection: Secondary | ICD-10-CM | POA: Diagnosis not present

## 2011-09-26 DIAGNOSIS — I219 Acute myocardial infarction, unspecified: Secondary | ICD-10-CM | POA: Diagnosis not present

## 2011-09-26 DIAGNOSIS — I2109 ST elevation (STEMI) myocardial infarction involving other coronary artery of anterior wall: Secondary | ICD-10-CM | POA: Diagnosis not present

## 2011-09-26 DIAGNOSIS — I2510 Atherosclerotic heart disease of native coronary artery without angina pectoris: Secondary | ICD-10-CM | POA: Diagnosis not present

## 2011-09-26 LAB — URINALYSIS, ROUTINE W REFLEX MICROSCOPIC
Glucose, UA: NEGATIVE mg/dL
Hgb urine dipstick: NEGATIVE
Ketones, ur: 15 mg/dL — AB
Nitrite: NEGATIVE
Protein, ur: NEGATIVE mg/dL
Specific Gravity, Urine: 1.022 (ref 1.005–1.030)
Urobilinogen, UA: 1 mg/dL (ref 0.0–1.0)
pH: 5.5 (ref 5.0–8.0)

## 2011-09-26 LAB — URINE MICROSCOPIC-ADD ON

## 2011-09-26 LAB — PRO B NATRIURETIC PEPTIDE: Pro B Natriuretic peptide (BNP): 5230 pg/mL — ABNORMAL HIGH (ref 0–125)

## 2011-09-26 MED ORDER — ATORVASTATIN CALCIUM 40 MG PO TABS: 40.0000 mg | ORAL_TABLET | Freq: Every day | ORAL | Status: DC

## 2011-09-26 MED ORDER — ACETAMINOPHEN 325 MG PO TABS: 650.0000 mg | ORAL_TABLET | ORAL | Status: DC | PRN

## 2011-09-26 MED ORDER — NITROGLYCERIN 0.4 MG SL SUBL: 0.4000 mg | SUBLINGUAL_TABLET | SUBLINGUAL | Status: DC | PRN

## 2011-09-26 MED ORDER — METOPROLOL TARTRATE 12.5 MG HALF TABLET
12.5000 mg | ORAL_TABLET | Freq: Two times a day (BID) | ORAL | Status: DC
  Filled 2011-09-26 (×2): qty 1

## 2011-09-26 MED ORDER — LIVING BETTER WITH HEART FAILURE BOOK
Freq: Once | Status: AC
  Administered 2011-09-26: 13:00:00
  Filled 2011-09-26: qty 1

## 2011-09-26 MED ORDER — RAMIPRIL 1.25 MG PO CAPS
1.2500 mg | ORAL_CAPSULE | Freq: Every day | ORAL | Status: DC
  Administered 2011-09-26 – 2011-09-27 (×2): 1.25 mg via ORAL
  Filled 2011-09-26 (×2): qty 1

## 2011-09-26 MED ORDER — RAMIPRIL 1.25 MG PO CAPS: 1.2500 mg | ORAL_CAPSULE | Freq: Every day | ORAL | Status: DC

## 2011-09-26 MED ORDER — TICAGRELOR 90 MG PO TABS: 90.0000 mg | ORAL_TABLET | Freq: Two times a day (BID) | ORAL | Status: DC

## 2011-09-26 MED ORDER — CARVEDILOL 3.125 MG PO TABS: 1.5600 mg | ORAL_TABLET | Freq: Two times a day (BID) | ORAL | Status: DC

## 2011-09-26 MED ORDER — HEART ATTACK BOUNCING BOOK
Freq: Once | Status: AC
  Administered 2011-09-26: 13:00:00
  Filled 2011-09-26: qty 1

## 2011-09-26 MED ORDER — CARVEDILOL 3.125 MG PO TABS
1.5600 mg | ORAL_TABLET | Freq: Two times a day (BID) | ORAL | Status: DC
  Administered 2011-09-26 – 2011-09-27 (×3): 1.56 mg via ORAL
  Filled 2011-09-26 (×5): qty 0.5

## 2011-09-26 NOTE — Progress Notes (Signed)
CARDIAC REHAB PHASE I   PRE:  Rate/Rhythm: 87 SR  BP:  Supine: 103/68  Sitting:   Standing:    SaO2:   MODE:  Ambulation: 680 ft   POST:  Rate/Rhythem: 111 ST  BP:  Supine:   Sitting: 110/60  Standing:    SaO2:  0750-0830 Assisted X 1 to ambulate. Gait with limp from prosthesis, which pt states is back to his normal. Able to walk 680 feet without c/o of cp or SOB. He states that he feels tired, but not exhausted. VS stable. Pt given exercise guidelines and information about Outpt. CRP.  Deon Pilling

## 2011-09-26 NOTE — Discharge Instructions (Signed)
Myocardial Infarction A myocardial infarction (MI) is damage to the heart that is not reversible. It is also called a heart attack. An MI usually occurs when a heart (coronary) artery becomes blocked or narrowed. This cuts off the blood supply to the heart. When one or more of the heart (coronary) arteries becomes blocked, that area of the heart begins to die. This causes pain felt during an MI.  If you think you might be having an MI, call your local emergency services immediately (911 in [place] It is recommended that you take a 162 mg non-enteric coated aspirin if you do not have an aspirin allergy. Do not drive yourself to the hospital or wait to see if your symptoms go away. The sooner MI is treated, the greater the amount of heart muscle saved. Time is muscle. It can save your life. CAUSES  An MI can occur from:  A gradual buildup of a fatty substance called plaque. When plaque builds up in the arteries, this condition is called atherosclerosis. This buildup can block or reduce the blood supply to the heart artery(s).   A sudden plaque rupture within a heart artery that causes a blood clot (thrombus). A blood clot can block the heart artery which does not allow blood flow to the heart.   A severe tightening (spasm) of the heart artery. This is a less common cause of a heart attack. When a heart artery spasms, it cuts off blood flow through the artery. Spasms can occur in heart arteries that do not have atherosclerosis.  RISK FACTORS People at risk for an MI usually have one or more risk factors, such as:  High blood pressure.   High cholesterol.   Smoking.   Gender. Men have a higher heart attack risk.   Overweight/obesity.   Age.   Family history.   Lack of exercise.   Diabetes.   Stress.   Excessive alcohol use.   Street drug use (cocaine and methamphetamines).  SYMPTOMS  MI symptoms can vary, such as:  In both men and women, MI symptoms can include the following:    Chest pain. The chest pain may feel like a crushing, squeezing, or "pressure" type feeling. MI pain can be "referred," meaning pain can be caused in one part of the body but felt in another part of the body. Referred MI pain may occur in the left arm, neck, or jaw. Pain may even be felt in the right arm.   Shortness of breath (dyspnea).   Heartburn or indigestion with or without vomiting, shortness of breath, or sweating (diaphoresis).   Sudden, cold sweats.   Sudden lightheadedness.   Upper back pain.   Women can have unique MI symptoms, such as:   Unexplained feelings of nervousness or anxiety.   Discomfort between the shoulder blades (scapula) or upper back.   Tingling in the hands and arms.   In elderly people (regardless of gender), MI symptoms can be subtle, such as:   Sweating (diaphoresis).   Shortness of breath (dyspnea).   General tiredness (fatigue) or not feeling well (malaise).  DIAGNOSIS  Diagnosis of an MI involves several tests such as:  An assessment of your vital signs such as heart rhythm, blood pressure, respiratory rate, and oxygen level.   An EKG (ECG) to look at the electrical activity of your heart.   Blood tests called cardiac markers are drawn at scheduled times to measure proteins or enzymes released by the damaged heart muscle.   A chest  X-ray.   An echocardiogram to evaluate heart motion and blood flow.   Coronary angiography (cardiac catheterization). This is a diagnostic procedure to look at the heart arteries.  TREATMENT  Acute Intervention. For an MI, the national standard in the [place] is to have an acute intervention in under 90 minutes from the time you get to the hospital. An acute intervention is a special procedure to open up the heart arteries. It is done in a treatment room called a "catheterization lab" (cath lab). Some hospitals do no have a cath lab. If you are having an MI and the hospital does not have a cath lab, the  standard is to transport you to a hospital that has one. In the cath lab, acute intervention includes:  Angioplasty. An angioplasty involves inserting a thin, flexible tube (catheter) into an artery in either your groin or wrist. The catheter is threaded to the heart arteries. A balloon at the end of the catheter is inflated to open a narrowed or blocked heart artery. During an angioplasty procedure, a small mesh tube (stent) may be used to keep the heart artery open. Depending on your condition and health history, one of two types of stents may be placed:   Drug-eluting stent (DES). A DES is coated with a medicine to prevent scar tissue from growing over the stent. With drug-eluting stents, blood thinning medicine will need to be taken for up to a year.   Bare metal stent. This type of stent has no special coating to keep tissue from growing over it. This type of stent is used if you cannot take blood thinning medicine for a prolonged time or you need surgery in the near future. After a bare metal stent is placed, blood thinning medicine will need to be taken for about a month.   If you are taking blood thinning medicine (anti-platelet therapy) after stent placement, do not stop taking it unless your caregiver says it is okay to do so. Make sure you understand how long you need to take the medicine.  Surgical Intervention  If an acute intervention is not successful, surgery may be needed:   Open heart surgery (coronary artery bypass graft, CABG). CABG takes a vein (saphenous vein) from your leg. The vein is then attached to the blocked heart artery which bypasses the blockage. This then allows blood flow to the heart muscle.  Additional Interventions  A "clot buster" medicine (thrombolytic) may be given. This medicine can help break up a clot in the heart artery. This medicine may be given if a person cannot get to a cath lab right away.   Intra-aortic balloon pump (IABP). If you have suffered a  very severe MI and are too unstable to go to the cath lab or to surgery, an IABP may be used. This is a temporary mechanical device used to increase blood flow to the heart and reduce the workload of the heart until you are stable enough to go to the cath lab or surgery.  HOME CARE INSTRUCTIONS After an MI, you may need the following:  Medication. Take medication as directed by your caregiver. Medications after an MI may:   Keep your blood from clotting easily (blood thinners).   Control your blood pressure.   Help lower your cholesterol.   Control abnormal heart rhythms.   Lifestyle changes. Under the guidance of your caregiver, lifestyle changes include:   Quitting smoking, if you smoke. Your caregiver can help you quit.   Being  physically active.   Maintaining a healthy weight.   Eating a heart healthy diet. A dietician can help you learn healthy eating options.   Managing diabetes.   Reducing stress.   Limiting alcohol intake.  SEEK IMMEDIATE MEDICAL CARE IF:   You have severe chest pain, especially if the pain is crushing or pressure-like and spreads to the arms, back, neck, or jaw. This is an emergency. Do not wait to see if the pain will go away. Get medical help at once. Call your local emergency services (911 in the [place] Do not drive yourself to the hospital.   You have shortness of breath during rest, sleep, or with activity.   You have sudden sweating or clammy skin.   You feel sick to your stomach (nauseous) and throw up (vomit).   You suddenly become lightheaded or dizzy.   You feel your heart beating rapidly or you notice "skipped" beats.  MAKE SURE YOU:   Understand these instructions.   Will watch your condition.   Will get help right away if you are not doing well or get worse.  Document Released: 05/19/2005 Document Revised: 05/08/2011 Document Reviewed: 10/16/2010 [facility] Patient Information 2012 [place], [place].
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
## 2011-09-27 DIAGNOSIS — I2119 ST elevation (STEMI) myocardial infarction involving other coronary artery of inferior wall: Secondary | ICD-10-CM | POA: Diagnosis not present

## 2011-09-27 MED ORDER — NITROGLYCERIN 0.3 MG SL SUBL
0.3000 mg | SUBLINGUAL_TABLET | SUBLINGUAL | Status: DC | PRN
  Filled 2011-09-27: qty 100

## 2011-09-27 MED ORDER — FUROSEMIDE 20 MG PO TABS: 20.0000 mg | ORAL_TABLET | Freq: Every day | ORAL | Status: DC | PRN

## 2011-09-27 MED ORDER — PANTOPRAZOLE SODIUM 40 MG PO TBEC: 40.0000 mg | DELAYED_RELEASE_TABLET | Freq: Every day | ORAL | Status: DC
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
## 2011-09-27 NOTE — Progress Notes (Signed)
Pt. Discharged 09/27/2011  1:08 PM Discharge instructions reviewed with patient/family. Patient/family verbalized understanding. All Rx's given. Questions answered as needed. Pt. Discharged to home with family/self.  Elmore Guise

## 2011-09-27 NOTE — Cardiovascular Report (Signed)
NAMEMarland Kitchen  [name], [name] NO.:  [id number]  MEDICAL RECORD NO.:  [id number]  LOCATION:  2502                         FACILITY:  [facility]  PHYSICIAN:  Shelva Majestic, M.D.     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  He has a history of hypertension and also history of aortic aneurysm.  Has a history of tobacco use with cigars.  Apparently approximately 45 years ago, he sustained a traumatic amputation to his right leg.  Today, he had experienced severe chest pain and ultimately presented to [facility] Emergency Room, where ECG showed evidence for acute ST-segment elevation myocardial infarction with at least 5 mm of elevation precordially as well as ST-segment elevation in leads 1 and L.  Code STEMI was called in the emergency room, and he was taken abruptly to the Catheterization Laboratory for emergent catheterization and intervention.  He was given Versed 2 mg intravenously in addition to fentanyl 50 mcg. Left femoral artery was punctured anteriorly and a 6-French sheath was inserted.  The patient was started on Angiomax bolus plus infusion.  He was given 180 mg of Brilinta.  With a suspicion for a total occlusion of the LAD, the 6-French right diagnostic catheter was initially inserted and nonselective angiography into the right coronary artery was performed.  Vessel had a superior takeoff.  Since there was adequate visualization and since the acute vessel was the LAD, the decision was then made to pursue with a left guiding catheter with plans to relook at the right RCA selectively at the completion of the study.  A 6-French fL3.5 was used for guiding catheter.  Angiography confirmed total occlusion of the LAD after proximal diagonal and septal  vessel.  There was TIMI 0 flow.  Angiomax was administered in addition to the [place]. The patient received IC nitroglycerin and was started on IV nitroglycerin drip.  After demonstration of therapeutic anticoagulation, Asahi medium wire was able to enter the LAD and was ultimately able to cross the total occlusion.  There initially was poor visualization beyond this.  The wire was advanced distally.  A 2.5 x 12 mm Emerge balloon was used for dilatation initially.  Multiple dilatations were made.  Once the vessel was opened, there again was diffuse disease beyond this.  There also was complete occlusion of the diagonal vessel just beyond the acute occlusion.  A 2.75 x 20 mm balloon was then used for dilatation to cover multiple sites of narrowing.  A 2.75 x 22 mm Resolute Integrity DES stent was then inserted and deployed x2 up to 12 atmospheres.  A 3.0 x 15 mm [place] Quantum Monorail balloon was used for post-stent dilatation up to 3 mm.  At this time, there was now brisk TIMI-3 flow proximally to the distal segment.  Visualization distally was not very well.  The wire was pulled back and more adequate visualization confirmed the distal LAD dissection.  It was uncertain if this was induced by the wire when it was  crossed distally when the vessel was not well visualized.  Consequently, a 2.0 x 15 mm Monorail balloon was then inserted in attempt to tack this dissection.  However, there was concern that this dissection was moving proximally in addition to distally.  The distal LAD was very small.  A 2.25 x 22 mm Resolute Integrity stent was then inserted distally.  This seemed to be inserted into the very distal aspect, but again not all the way down since the vessel was very small.  This was very small and it was difficult to get the wire to the most distal aspect.  This was then successfully deployed up to approximately 6 atmospheres since this was small-caliber vessel. Scout angiography  confirmed good apposition, but there was a suggestion of possible blushing of contrast beyond the stented segment, where a diminutive septal branch was located.  Consequently, long inflations were made in the stent in attempt to tamponade the region above it which led to complete resolution of this small area of contrast blushing.  The Angiomax was held at this point.  It then became apparent that there was still some dissection above, where the stent was located and although not well visualized, perhaps this did extend up to the initially placed stented region.  Consequently, it was felt that this entire region need to be stented to cover the dissection.  Consequently, then a 2.5 x 22 mm Resolute stent was placed just proximal to the most distally placed stent and dilated x2 up to 10 atmospheres.  An additional 2.75 x 22 mm Resolute stent was then inserted again with tandem overlap from the 2.5 stent as well as overlapping with the more proximal 2.75 stent.  This was dilated x2.  A 2.5 x 21 mm Noncompliant Sprinter balloon was used for post-stent dilatation as well as then a 3.0 x 15 mm Quantum Noncompliant Monorail balloon to post dilate the 2.75 stents.  The entire LAD was, therefore, stented to the very distal aspect.  Flow was sluggish.  The patient did receive several doses of adenosine 24 and then 36 mcg to improve flow.  He was not having any chest pain.  His hemodynamics were stable.  At that point, the Angiomax was reinstituted with plans to continue the Angiomax in attempt to improve flow.  Due to the contrast load, the decision was made not to relook at the right coronary artery as well as not to look at his aneurysm.  A pigtail catheter was inserted and this was passed into the left ventricle to measure pressures, but left ventriculography was not performed.  A pullback was then performed.  Arterial sheath was sutured in place with plans for sheath removal later today.  Again,  Angiomax was started, and the patient was maintained on IV nitroglycerin in an attempt to hopefully improve flow in this stented LAD system.  ANGIOGRAPHIC DATA: 1. Left main coronary artery was angiographically normal and     bifurcated into an LAD and left circumflex system. 2. The LAD proximally had 30% narrowing prior to a diagonal vessel.     There was 50% stenosis in the midportion of the diagonal vessel.     Immediately following this was a septal perforating artery and the     LAD was then totally occluded with TIMI 0 flow. 3. The circumflex vessel was moderate-sized vessel that gave rise to 2     marginal vessels. 4. The right coronary artery had superior takeoff.  There was 60%  proximal stenosis followed by 50% proximal stenosis.  There was     diffuse 50% stenoses in the crux and in the distal continuation     RCA.  Once the Asahi medium wire was able to cross the total LAD occlusion, there again was diffuse tortuosity and irregularity of the LAD system. A second diagonal vessel was completely occluded with TIMI 0 flow. There were multiple stenoses of 50-60%.  Initially, the 2.75 x 22 mm Resolute stent was inserted proximally at the site of total occlusion and post-dilated with 3.0 mm.  However, due to the apparent dissection in the distal LAD which was not visualized initially, ultimately 3 additional stents were placed 2.25 x 22, 2.5 x 22, and 2.75 x 22 to cover the entire area.  Again, a small apical portion of the LAD was still not stentable, but the vessel was too small.  At the completion of the study, flow still remained poor despite adenosine boluses x2 and IV and IC nitroglycerin.  However, the patient left the Cath Lab with stable hemodynamics chest pain free.  IMPRESSION: 1. Acute ST-segment elevation anterior wall myocardial infarction     secondary to total proximal occlusion of the LAD after first     diagonal and septal perforating artery. 2. Normal  circumflex coronary artery. 3. Diffusely diseased right coronary artery with segmental 50%     proximal narrowings and 50% narrowings in the region of the acute     margin and beyond the acute margin. 4. Difficult intervention to the LAD system with 100% occlusion being     reduced to 0% with ultimate insertion of a 2.75 x 22 mm Resolute     Integrity DES stent, but evidence for distal LAD dissection with     propagation     anteriorly necessitating 3 additional stent placements in tandem     fashion with 2.25 x 22, 2.5 x 22, and additional 2.75 x 22 distally     to proximally with the vessel being widely open but with still     reduced flow at the completion of the procedure  despite IV and IC     nitroglycerin in addition to adenosine bolus x2.          ______________________________ Shelva Majestic, M.D.     TK/MEDQ  D:  09/26/2011  T:  09/27/2011  Job:  [id number]
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
## 2011-09-27 NOTE — Progress Notes (Signed)
CARDIAC REHAB PHASE I   PRE:  Rate/Rhythm: 94 SR    BP: standing after doning prosthesis 120/70    SaO2:   MODE:  Ambulation: 700 ft   POST:  Rate/Rhythm: 95    BP: sitting 106/70     SaO2:   Tolerated well. No c/o. Spoke with Dr. Ellyn Hack re CRPII and he and pt want to try arm ergometer and walking track so will send referral to [facility]. [id number]  Darrick Meigs CES, ACSM

## 2011-09-30 DIAGNOSIS — M79609 Pain in unspecified limb: Secondary | ICD-10-CM | POA: Diagnosis not present

## 2011-09-30 DIAGNOSIS — R229 Localized swelling, mass and lump, unspecified: Secondary | ICD-10-CM | POA: Diagnosis not present

## 2011-09-30 HISTORY — PX: OTHER SURGICAL HISTORY: SHX169

## 2011-09-30 LAB — CULTURE, BLOOD (ROUTINE X 2)
Culture  Setup Time: 201304240426
Culture  Setup Time: 201304240426
Culture: NO GROWTH
Culture: NO GROWTH

## 2011-10-02 ENCOUNTER — Ambulatory Visit: Payer: Medicare Other | Admitting: Gastroenterology

## 2011-10-02 DIAGNOSIS — I2119 ST elevation (STEMI) myocardial infarction involving other coronary artery of inferior wall: Secondary | ICD-10-CM | POA: Diagnosis not present

## 2011-10-02 DIAGNOSIS — I714 Abdominal aortic aneurysm, without rupture: Secondary | ICD-10-CM | POA: Diagnosis not present

## 2011-10-06 DIAGNOSIS — I4510 Unspecified right bundle-branch block: Secondary | ICD-10-CM | POA: Diagnosis not present

## 2011-10-06 DIAGNOSIS — E782 Mixed hyperlipidemia: Secondary | ICD-10-CM | POA: Diagnosis not present

## 2011-10-09 ENCOUNTER — Ambulatory Visit: Payer: Medicare Other | Admitting: Gastroenterology

## 2011-10-30 ENCOUNTER — Encounter (HOSPITAL_COMMUNITY)
Admission: RE | Admit: 2011-10-30 | Discharge: 2011-10-30 | Disposition: A | Discharge status: Home / Self Care | Payer: Medicare Other | Source: Ambulatory Visit | Attending: Cardiovascular Disease | Admitting: Cardiovascular Disease

## 2011-10-30 DIAGNOSIS — E782 Mixed hyperlipidemia: Secondary | ICD-10-CM | POA: Diagnosis not present

## 2011-10-30 DIAGNOSIS — I2109 ST elevation (STEMI) myocardial infarction involving other coronary artery of anterior wall: Secondary | ICD-10-CM | POA: Diagnosis not present

## 2011-10-30 DIAGNOSIS — Z79899 Other long term (current) drug therapy: Secondary | ICD-10-CM | POA: Diagnosis not present

## 2011-10-30 DIAGNOSIS — R5381 Other malaise: Secondary | ICD-10-CM | POA: Diagnosis not present

## 2011-10-30 NOTE — Progress Notes (Addendum)
Cardiac Rehab Medication Review by a Pharmacist  Does the patient  feel that his/her medications are working for him/her?  yes  Has the patient been experiencing any side effects to the medications prescribed?  no  Does the patient measure his/her own blood pressure or blood glucose at home?  yes   Does the patient have any problems obtaining medications due to transportation or finances?   no  Understanding of regimen: good Understanding of indications: good Potential of compliance: good    Pharmacist comments: [name] is doing great and eager to get started. He has no issues with his medications and no side effects. He checks his blood pressure at home once daily    Stephen [name], PharmD 10/30/2011 8:09 AM

## 2011-11-03 ENCOUNTER — Encounter (HOSPITAL_COMMUNITY)
Admission: RE | Admit: 2011-11-03 | Discharge: 2011-11-03 | Disposition: A | Discharge status: Home / Self Care | Payer: Medicare Other | Source: Ambulatory Visit | Attending: Cardiovascular Disease | Admitting: Cardiovascular Disease

## 2011-11-03 ENCOUNTER — Encounter (HOSPITAL_COMMUNITY): Payer: Medicare Other

## 2011-11-03 DIAGNOSIS — I719 Aortic aneurysm of unspecified site, without rupture: Secondary | ICD-10-CM | POA: Diagnosis not present

## 2011-11-03 DIAGNOSIS — Z5189 Encounter for other specified aftercare: Secondary | ICD-10-CM | POA: Insufficient documentation

## 2011-11-03 DIAGNOSIS — E785 Hyperlipidemia, unspecified: Secondary | ICD-10-CM | POA: Insufficient documentation

## 2011-11-03 DIAGNOSIS — F17200 Nicotine dependence, unspecified, uncomplicated: Secondary | ICD-10-CM | POA: Insufficient documentation

## 2011-11-03 DIAGNOSIS — I2510 Atherosclerotic heart disease of native coronary artery without angina pectoris: Secondary | ICD-10-CM | POA: Diagnosis not present

## 2011-11-03 DIAGNOSIS — I2589 Other forms of chronic ischemic heart disease: Secondary | ICD-10-CM | POA: Insufficient documentation

## 2011-11-03 DIAGNOSIS — K219 Gastro-esophageal reflux disease without esophagitis: Secondary | ICD-10-CM | POA: Insufficient documentation

## 2011-11-03 NOTE — Progress Notes (Signed)
Pt started cardiac rehab today.  Pt tolerated light exercise without difficulty. Pt wearing life vest and has back up battery.  Monitor showed BBB with no noted ectopy.  Exercises modified to as needed. Continue to monitor.

## 2011-11-05 ENCOUNTER — Encounter (HOSPITAL_COMMUNITY): Payer: Medicare Other

## 2011-11-05 ENCOUNTER — Encounter (HOSPITAL_COMMUNITY)
Admission: RE | Admit: 2011-11-05 | Discharge: 2011-11-05 | Disposition: A | Discharge status: Home / Self Care | Payer: Medicare Other | Source: Ambulatory Visit | Attending: Cardiovascular Disease | Admitting: Cardiovascular Disease

## 2011-11-05 DIAGNOSIS — E785 Hyperlipidemia, unspecified: Secondary | ICD-10-CM | POA: Diagnosis not present

## 2011-11-05 DIAGNOSIS — K219 Gastro-esophageal reflux disease without esophagitis: Secondary | ICD-10-CM | POA: Diagnosis not present

## 2011-11-05 DIAGNOSIS — Z5189 Encounter for other specified aftercare: Secondary | ICD-10-CM | POA: Diagnosis not present

## 2011-11-05 DIAGNOSIS — I2510 Atherosclerotic heart disease of native coronary artery without angina pectoris: Secondary | ICD-10-CM | POA: Diagnosis not present

## 2011-11-05 DIAGNOSIS — I719 Aortic aneurysm of unspecified site, without rupture: Secondary | ICD-10-CM | POA: Diagnosis not present

## 2011-11-05 DIAGNOSIS — F17200 Nicotine dependence, unspecified, uncomplicated: Secondary | ICD-10-CM | POA: Diagnosis not present

## 2011-11-07 ENCOUNTER — Encounter (HOSPITAL_COMMUNITY): Payer: Medicare Other

## 2011-11-07 ENCOUNTER — Encounter (HOSPITAL_COMMUNITY)
Admission: RE | Admit: 2011-11-07 | Discharge: 2011-11-07 | Disposition: A | Discharge status: Home / Self Care | Payer: Medicare Other | Source: Ambulatory Visit | Attending: Cardiovascular Disease | Admitting: Cardiovascular Disease

## 2011-11-07 DIAGNOSIS — F17200 Nicotine dependence, unspecified, uncomplicated: Secondary | ICD-10-CM | POA: Diagnosis not present

## 2011-11-07 DIAGNOSIS — E782 Mixed hyperlipidemia: Secondary | ICD-10-CM | POA: Diagnosis not present

## 2011-11-07 DIAGNOSIS — I4510 Unspecified right bundle-branch block: Secondary | ICD-10-CM | POA: Diagnosis not present

## 2011-11-07 DIAGNOSIS — I2510 Atherosclerotic heart disease of native coronary artery without angina pectoris: Secondary | ICD-10-CM | POA: Diagnosis not present

## 2011-11-07 DIAGNOSIS — I719 Aortic aneurysm of unspecified site, without rupture: Secondary | ICD-10-CM | POA: Diagnosis not present

## 2011-11-07 DIAGNOSIS — E785 Hyperlipidemia, unspecified: Secondary | ICD-10-CM | POA: Diagnosis not present

## 2011-11-07 DIAGNOSIS — Z5189 Encounter for other specified aftercare: Secondary | ICD-10-CM | POA: Diagnosis not present

## 2011-11-07 DIAGNOSIS — K219 Gastro-esophageal reflux disease without esophagitis: Secondary | ICD-10-CM | POA: Diagnosis not present

## 2011-11-10 ENCOUNTER — Encounter (HOSPITAL_COMMUNITY)
Admission: RE | Admit: 2011-11-10 | Discharge: 2011-11-10 | Disposition: A | Discharge status: Home / Self Care | Payer: Medicare Other | Source: Ambulatory Visit | Attending: Cardiovascular Disease | Admitting: Cardiovascular Disease

## 2011-11-10 ENCOUNTER — Encounter (HOSPITAL_COMMUNITY): Payer: Medicare Other

## 2011-11-10 DIAGNOSIS — I719 Aortic aneurysm of unspecified site, without rupture: Secondary | ICD-10-CM | POA: Diagnosis not present

## 2011-11-10 DIAGNOSIS — E785 Hyperlipidemia, unspecified: Secondary | ICD-10-CM | POA: Diagnosis not present

## 2011-11-10 DIAGNOSIS — Z5189 Encounter for other specified aftercare: Secondary | ICD-10-CM | POA: Diagnosis not present

## 2011-11-10 DIAGNOSIS — K219 Gastro-esophageal reflux disease without esophagitis: Secondary | ICD-10-CM | POA: Diagnosis not present

## 2011-11-10 DIAGNOSIS — F17200 Nicotine dependence, unspecified, uncomplicated: Secondary | ICD-10-CM | POA: Diagnosis not present

## 2011-11-10 DIAGNOSIS — I2510 Atherosclerotic heart disease of native coronary artery without angina pectoris: Secondary | ICD-10-CM | POA: Diagnosis not present

## 2011-11-10 NOTE — Progress Notes (Signed)
Pt with medication  Changes. Furosemide 10mg  d/c'd. Ramipril inc to 5mg  daily and Zetia 10mg  added.  Medication list updated.

## 2011-11-12 ENCOUNTER — Encounter (HOSPITAL_COMMUNITY)
Admission: RE | Admit: 2011-11-12 | Discharge: 2011-11-12 | Disposition: A | Discharge status: Home / Self Care | Payer: Medicare Other | Source: Ambulatory Visit | Attending: Cardiovascular Disease | Admitting: Cardiovascular Disease

## 2011-11-12 ENCOUNTER — Encounter (HOSPITAL_COMMUNITY): Payer: Medicare Other

## 2011-11-12 DIAGNOSIS — F17200 Nicotine dependence, unspecified, uncomplicated: Secondary | ICD-10-CM | POA: Diagnosis not present

## 2011-11-12 DIAGNOSIS — I719 Aortic aneurysm of unspecified site, without rupture: Secondary | ICD-10-CM | POA: Diagnosis not present

## 2011-11-12 DIAGNOSIS — I2510 Atherosclerotic heart disease of native coronary artery without angina pectoris: Secondary | ICD-10-CM | POA: Diagnosis not present

## 2011-11-12 DIAGNOSIS — E785 Hyperlipidemia, unspecified: Secondary | ICD-10-CM | POA: Diagnosis not present

## 2011-11-12 DIAGNOSIS — K219 Gastro-esophageal reflux disease without esophagitis: Secondary | ICD-10-CM | POA: Diagnosis not present

## 2011-11-12 DIAGNOSIS — Z5189 Encounter for other specified aftercare: Secondary | ICD-10-CM | POA: Diagnosis not present

## 2011-11-14 ENCOUNTER — Encounter (HOSPITAL_COMMUNITY): Payer: Medicare Other

## 2011-11-14 ENCOUNTER — Encounter (HOSPITAL_COMMUNITY)
Admission: RE | Admit: 2011-11-14 | Discharge: 2011-11-14 | Disposition: A | Discharge status: Home / Self Care | Payer: Medicare Other | Source: Ambulatory Visit | Attending: Cardiovascular Disease | Admitting: Cardiovascular Disease

## 2011-11-14 DIAGNOSIS — I2510 Atherosclerotic heart disease of native coronary artery without angina pectoris: Secondary | ICD-10-CM | POA: Diagnosis not present

## 2011-11-14 DIAGNOSIS — E785 Hyperlipidemia, unspecified: Secondary | ICD-10-CM | POA: Diagnosis not present

## 2011-11-14 DIAGNOSIS — I719 Aortic aneurysm of unspecified site, without rupture: Secondary | ICD-10-CM | POA: Diagnosis not present

## 2011-11-14 DIAGNOSIS — K219 Gastro-esophageal reflux disease without esophagitis: Secondary | ICD-10-CM | POA: Diagnosis not present

## 2011-11-14 DIAGNOSIS — Z5189 Encounter for other specified aftercare: Secondary | ICD-10-CM | POA: Diagnosis not present

## 2011-11-14 DIAGNOSIS — F17200 Nicotine dependence, unspecified, uncomplicated: Secondary | ICD-10-CM | POA: Diagnosis not present

## 2011-11-17 ENCOUNTER — Encounter (HOSPITAL_COMMUNITY)
Admission: RE | Admit: 2011-11-17 | Discharge: 2011-11-17 | Disposition: A | Discharge status: Home / Self Care | Payer: Medicare Other | Source: Ambulatory Visit | Attending: Cardiovascular Disease | Admitting: Cardiovascular Disease

## 2011-11-17 ENCOUNTER — Encounter (HOSPITAL_COMMUNITY): Payer: Medicare Other

## 2011-11-17 DIAGNOSIS — Z5189 Encounter for other specified aftercare: Secondary | ICD-10-CM | POA: Diagnosis not present

## 2011-11-17 DIAGNOSIS — E785 Hyperlipidemia, unspecified: Secondary | ICD-10-CM | POA: Diagnosis not present

## 2011-11-17 DIAGNOSIS — K219 Gastro-esophageal reflux disease without esophagitis: Secondary | ICD-10-CM | POA: Diagnosis not present

## 2011-11-17 DIAGNOSIS — I2510 Atherosclerotic heart disease of native coronary artery without angina pectoris: Secondary | ICD-10-CM | POA: Diagnosis not present

## 2011-11-17 DIAGNOSIS — F17200 Nicotine dependence, unspecified, uncomplicated: Secondary | ICD-10-CM | POA: Diagnosis not present

## 2011-11-17 DIAGNOSIS — I719 Aortic aneurysm of unspecified site, without rupture: Secondary | ICD-10-CM | POA: Diagnosis not present

## 2011-11-17 NOTE — Progress Notes (Signed)
Reviewed home exercise with pt today.  Pt plans to walk with his [place] at home for exercise.  Reviewed THR, pulse, RPE, sign and symptoms, NTG use, and when to call 911 or MD.  Pt voiced understanding. Alberteen Sam, MA, ACSM RCEP

## 2011-11-19 ENCOUNTER — Encounter (HOSPITAL_COMMUNITY): Payer: Medicare Other

## 2011-11-19 ENCOUNTER — Encounter (HOSPITAL_COMMUNITY)
Admission: RE | Admit: 2011-11-19 | Discharge: 2011-11-19 | Disposition: A | Discharge status: Home / Self Care | Payer: Medicare Other | Source: Ambulatory Visit | Attending: Cardiovascular Disease | Admitting: Cardiovascular Disease

## 2011-11-19 DIAGNOSIS — I719 Aortic aneurysm of unspecified site, without rupture: Secondary | ICD-10-CM | POA: Diagnosis not present

## 2011-11-19 DIAGNOSIS — I2510 Atherosclerotic heart disease of native coronary artery without angina pectoris: Secondary | ICD-10-CM | POA: Diagnosis not present

## 2011-11-19 DIAGNOSIS — F17200 Nicotine dependence, unspecified, uncomplicated: Secondary | ICD-10-CM | POA: Diagnosis not present

## 2011-11-19 DIAGNOSIS — E785 Hyperlipidemia, unspecified: Secondary | ICD-10-CM | POA: Diagnosis not present

## 2011-11-19 DIAGNOSIS — Z5189 Encounter for other specified aftercare: Secondary | ICD-10-CM | POA: Diagnosis not present

## 2011-11-19 DIAGNOSIS — K219 Gastro-esophageal reflux disease without esophagitis: Secondary | ICD-10-CM | POA: Diagnosis not present

## 2011-11-21 ENCOUNTER — Encounter (HOSPITAL_COMMUNITY): Payer: Medicare Other

## 2011-11-21 ENCOUNTER — Encounter (HOSPITAL_COMMUNITY)
Admission: RE | Admit: 2011-11-21 | Discharge: 2011-11-21 | Disposition: A | Discharge status: Home / Self Care | Payer: Medicare Other | Source: Ambulatory Visit | Attending: Cardiovascular Disease | Admitting: Cardiovascular Disease

## 2011-11-21 DIAGNOSIS — K219 Gastro-esophageal reflux disease without esophagitis: Secondary | ICD-10-CM | POA: Diagnosis not present

## 2011-11-21 DIAGNOSIS — I719 Aortic aneurysm of unspecified site, without rupture: Secondary | ICD-10-CM | POA: Diagnosis not present

## 2011-11-21 DIAGNOSIS — I2510 Atherosclerotic heart disease of native coronary artery without angina pectoris: Secondary | ICD-10-CM | POA: Diagnosis not present

## 2011-11-21 DIAGNOSIS — E785 Hyperlipidemia, unspecified: Secondary | ICD-10-CM | POA: Diagnosis not present

## 2011-11-21 DIAGNOSIS — Z5189 Encounter for other specified aftercare: Secondary | ICD-10-CM | POA: Diagnosis not present

## 2011-11-21 DIAGNOSIS — F17200 Nicotine dependence, unspecified, uncomplicated: Secondary | ICD-10-CM | POA: Diagnosis not present

## 2011-11-24 ENCOUNTER — Encounter (HOSPITAL_COMMUNITY): Payer: Medicare Other

## 2011-11-24 ENCOUNTER — Encounter (HOSPITAL_COMMUNITY)
Admission: RE | Admit: 2011-11-24 | Discharge: 2011-11-24 | Disposition: A | Discharge status: Home / Self Care | Payer: Medicare Other | Source: Ambulatory Visit | Attending: Cardiovascular Disease | Admitting: Cardiovascular Disease

## 2011-11-24 DIAGNOSIS — I2510 Atherosclerotic heart disease of native coronary artery without angina pectoris: Secondary | ICD-10-CM | POA: Diagnosis not present

## 2011-11-24 DIAGNOSIS — K219 Gastro-esophageal reflux disease without esophagitis: Secondary | ICD-10-CM | POA: Diagnosis not present

## 2011-11-24 DIAGNOSIS — F17200 Nicotine dependence, unspecified, uncomplicated: Secondary | ICD-10-CM | POA: Diagnosis not present

## 2011-11-24 DIAGNOSIS — Z5189 Encounter for other specified aftercare: Secondary | ICD-10-CM | POA: Diagnosis not present

## 2011-11-24 DIAGNOSIS — I719 Aortic aneurysm of unspecified site, without rupture: Secondary | ICD-10-CM | POA: Diagnosis not present

## 2011-11-24 DIAGNOSIS — E785 Hyperlipidemia, unspecified: Secondary | ICD-10-CM | POA: Diagnosis not present

## 2011-11-26 ENCOUNTER — Encounter (HOSPITAL_COMMUNITY)
Admission: RE | Admit: 2011-11-26 | Discharge: 2011-11-26 | Disposition: A | Discharge status: Home / Self Care | Payer: Medicare Other | Source: Ambulatory Visit | Attending: Cardiovascular Disease | Admitting: Cardiovascular Disease

## 2011-11-26 ENCOUNTER — Encounter (HOSPITAL_COMMUNITY): Payer: Medicare Other

## 2011-11-26 DIAGNOSIS — I2510 Atherosclerotic heart disease of native coronary artery without angina pectoris: Secondary | ICD-10-CM | POA: Diagnosis not present

## 2011-11-26 DIAGNOSIS — E785 Hyperlipidemia, unspecified: Secondary | ICD-10-CM | POA: Diagnosis not present

## 2011-11-26 DIAGNOSIS — F17200 Nicotine dependence, unspecified, uncomplicated: Secondary | ICD-10-CM | POA: Diagnosis not present

## 2011-11-26 DIAGNOSIS — Z5189 Encounter for other specified aftercare: Secondary | ICD-10-CM | POA: Diagnosis not present

## 2011-11-26 DIAGNOSIS — K219 Gastro-esophageal reflux disease without esophagitis: Secondary | ICD-10-CM | POA: Diagnosis not present

## 2011-11-26 DIAGNOSIS — I719 Aortic aneurysm of unspecified site, without rupture: Secondary | ICD-10-CM | POA: Diagnosis not present

## 2011-11-28 ENCOUNTER — Encounter (HOSPITAL_COMMUNITY): Payer: Medicare Other

## 2011-11-28 ENCOUNTER — Encounter (HOSPITAL_COMMUNITY)
Admission: RE | Admit: 2011-11-28 | Discharge: 2011-11-28 | Disposition: A | Discharge status: Home / Self Care | Payer: Medicare Other | Source: Ambulatory Visit | Attending: Cardiovascular Disease | Admitting: Cardiovascular Disease

## 2011-11-28 DIAGNOSIS — I2510 Atherosclerotic heart disease of native coronary artery without angina pectoris: Secondary | ICD-10-CM | POA: Diagnosis not present

## 2011-11-28 DIAGNOSIS — Z5189 Encounter for other specified aftercare: Secondary | ICD-10-CM | POA: Diagnosis not present

## 2011-11-28 DIAGNOSIS — K219 Gastro-esophageal reflux disease without esophagitis: Secondary | ICD-10-CM | POA: Diagnosis not present

## 2011-11-28 DIAGNOSIS — F17200 Nicotine dependence, unspecified, uncomplicated: Secondary | ICD-10-CM | POA: Diagnosis not present

## 2011-11-28 DIAGNOSIS — E785 Hyperlipidemia, unspecified: Secondary | ICD-10-CM | POA: Diagnosis not present

## 2011-11-28 DIAGNOSIS — I719 Aortic aneurysm of unspecified site, without rupture: Secondary | ICD-10-CM | POA: Diagnosis not present

## 2011-12-01 ENCOUNTER — Encounter (HOSPITAL_COMMUNITY): Payer: Medicare Other

## 2011-12-01 ENCOUNTER — Encounter (HOSPITAL_COMMUNITY)
Admission: RE | Admit: 2011-12-01 | Discharge: 2011-12-01 | Disposition: A | Discharge status: Home / Self Care | Payer: Medicare Other | Source: Ambulatory Visit | Attending: Cardiovascular Disease | Admitting: Cardiovascular Disease

## 2011-12-01 DIAGNOSIS — I2589 Other forms of chronic ischemic heart disease: Secondary | ICD-10-CM | POA: Diagnosis not present

## 2011-12-01 DIAGNOSIS — I2510 Atherosclerotic heart disease of native coronary artery without angina pectoris: Secondary | ICD-10-CM | POA: Diagnosis not present

## 2011-12-01 DIAGNOSIS — Z5189 Encounter for other specified aftercare: Secondary | ICD-10-CM | POA: Insufficient documentation

## 2011-12-01 DIAGNOSIS — K219 Gastro-esophageal reflux disease without esophagitis: Secondary | ICD-10-CM | POA: Insufficient documentation

## 2011-12-01 DIAGNOSIS — E785 Hyperlipidemia, unspecified: Secondary | ICD-10-CM | POA: Insufficient documentation

## 2011-12-01 DIAGNOSIS — F17200 Nicotine dependence, unspecified, uncomplicated: Secondary | ICD-10-CM | POA: Insufficient documentation

## 2011-12-01 DIAGNOSIS — I719 Aortic aneurysm of unspecified site, without rupture: Secondary | ICD-10-CM | POA: Diagnosis not present

## 2011-12-03 ENCOUNTER — Encounter (HOSPITAL_COMMUNITY): Payer: Medicare Other

## 2011-12-03 ENCOUNTER — Encounter (HOSPITAL_COMMUNITY)
Admission: RE | Admit: 2011-12-03 | Discharge: 2011-12-03 | Disposition: A | Discharge status: Home / Self Care | Payer: Medicare Other | Source: Ambulatory Visit | Attending: Cardiovascular Disease | Admitting: Cardiovascular Disease

## 2011-12-05 ENCOUNTER — Encounter (HOSPITAL_COMMUNITY): Payer: Medicare Other

## 2011-12-05 ENCOUNTER — Encounter (HOSPITAL_COMMUNITY)
Admission: RE | Admit: 2011-12-05 | Discharge: 2011-12-05 | Disposition: A | Discharge status: Home / Self Care | Payer: Medicare Other | Source: Ambulatory Visit | Attending: Cardiovascular Disease | Admitting: Cardiovascular Disease

## 2011-12-08 ENCOUNTER — Encounter (HOSPITAL_COMMUNITY)
Admission: RE | Admit: 2011-12-08 | Discharge: 2011-12-08 | Disposition: A | Discharge status: Home / Self Care | Payer: Medicare Other | Source: Ambulatory Visit | Attending: Cardiovascular Disease | Admitting: Cardiovascular Disease

## 2011-12-08 ENCOUNTER — Encounter (HOSPITAL_COMMUNITY): Payer: Medicare Other

## 2011-12-10 ENCOUNTER — Encounter (HOSPITAL_COMMUNITY): Payer: Medicare Other

## 2011-12-10 ENCOUNTER — Encounter (HOSPITAL_COMMUNITY)
Admission: RE | Admit: 2011-12-10 | Discharge: 2011-12-10 | Disposition: A | Discharge status: Home / Self Care | Payer: Medicare Other | Source: Ambulatory Visit | Attending: Cardiovascular Disease | Admitting: Cardiovascular Disease

## 2011-12-12 ENCOUNTER — Encounter (HOSPITAL_COMMUNITY)
Admission: RE | Admit: 2011-12-12 | Discharge: 2011-12-12 | Disposition: A | Discharge status: Home / Self Care | Payer: Medicare Other | Source: Ambulatory Visit | Attending: Cardiovascular Disease | Admitting: Cardiovascular Disease

## 2011-12-12 ENCOUNTER — Encounter (HOSPITAL_COMMUNITY): Payer: Medicare Other

## 2011-12-15 ENCOUNTER — Encounter (HOSPITAL_COMMUNITY)
Admission: RE | Admit: 2011-12-15 | Discharge: 2011-12-15 | Disposition: A | Discharge status: Home / Self Care | Payer: Medicare Other | Source: Ambulatory Visit | Attending: Cardiovascular Disease | Admitting: Cardiovascular Disease

## 2011-12-15 ENCOUNTER — Encounter (HOSPITAL_COMMUNITY): Payer: Medicare Other

## 2011-12-17 ENCOUNTER — Encounter (HOSPITAL_COMMUNITY): Payer: Medicare Other

## 2011-12-17 ENCOUNTER — Encounter (HOSPITAL_COMMUNITY)
Admission: RE | Admit: 2011-12-17 | Discharge: 2011-12-17 | Disposition: A | Discharge status: Home / Self Care | Payer: Medicare Other | Source: Ambulatory Visit | Attending: Cardiovascular Disease | Admitting: Cardiovascular Disease

## 2011-12-19 ENCOUNTER — Encounter (HOSPITAL_COMMUNITY)
Admission: RE | Admit: 2011-12-19 | Discharge: 2011-12-19 | Disposition: A | Discharge status: Home / Self Care | Payer: Medicare Other | Source: Ambulatory Visit | Attending: Cardiovascular Disease | Admitting: Cardiovascular Disease

## 2011-12-19 ENCOUNTER — Encounter (HOSPITAL_COMMUNITY): Payer: Medicare Other

## 2011-12-22 ENCOUNTER — Encounter (HOSPITAL_COMMUNITY): Payer: Medicare Other

## 2011-12-22 ENCOUNTER — Encounter (HOSPITAL_COMMUNITY)
Admission: RE | Admit: 2011-12-22 | Discharge: 2011-12-22 | Disposition: A | Discharge status: Home / Self Care | Payer: Medicare Other | Source: Ambulatory Visit | Attending: Cardiovascular Disease | Admitting: Cardiovascular Disease

## 2011-12-23 DIAGNOSIS — E785 Hyperlipidemia, unspecified: Secondary | ICD-10-CM | POA: Diagnosis not present

## 2011-12-23 DIAGNOSIS — Z79899 Other long term (current) drug therapy: Secondary | ICD-10-CM | POA: Diagnosis not present

## 2011-12-24 ENCOUNTER — Encounter (HOSPITAL_COMMUNITY)
Admission: RE | Admit: 2011-12-24 | Discharge: 2011-12-24 | Disposition: A | Discharge status: Home / Self Care | Payer: Medicare Other | Source: Ambulatory Visit | Attending: Cardiovascular Disease | Admitting: Cardiovascular Disease

## 2011-12-24 ENCOUNTER — Encounter (HOSPITAL_COMMUNITY): Payer: Medicare Other

## 2011-12-26 ENCOUNTER — Encounter (HOSPITAL_COMMUNITY): Payer: Medicare Other

## 2011-12-26 ENCOUNTER — Encounter (HOSPITAL_COMMUNITY)
Admission: RE | Admit: 2011-12-26 | Discharge: 2011-12-26 | Disposition: A | Discharge status: Home / Self Care | Payer: Medicare Other | Source: Ambulatory Visit | Attending: Cardiovascular Disease | Admitting: Cardiovascular Disease

## 2011-12-29 ENCOUNTER — Encounter (HOSPITAL_COMMUNITY): Payer: Medicare Other

## 2011-12-29 ENCOUNTER — Encounter (HOSPITAL_COMMUNITY)
Admission: RE | Admit: 2011-12-29 | Discharge: 2011-12-29 | Disposition: A | Discharge status: Home / Self Care | Payer: Medicare Other | Source: Ambulatory Visit | Attending: Cardiovascular Disease | Admitting: Cardiovascular Disease

## 2011-12-31 ENCOUNTER — Encounter (HOSPITAL_COMMUNITY)
Admission: RE | Admit: 2011-12-31 | Discharge: 2011-12-31 | Disposition: A | Discharge status: Home / Self Care | Payer: Medicare Other | Source: Ambulatory Visit | Attending: Cardiovascular Disease | Admitting: Cardiovascular Disease

## 2011-12-31 ENCOUNTER — Encounter (HOSPITAL_COMMUNITY): Payer: Medicare Other

## 2011-12-31 DIAGNOSIS — I2589 Other forms of chronic ischemic heart disease: Secondary | ICD-10-CM | POA: Diagnosis not present

## 2011-12-31 DIAGNOSIS — E782 Mixed hyperlipidemia: Secondary | ICD-10-CM | POA: Diagnosis not present

## 2012-01-02 ENCOUNTER — Encounter (HOSPITAL_COMMUNITY): Payer: Medicare Other

## 2012-01-02 ENCOUNTER — Encounter (HOSPITAL_COMMUNITY)
Admission: RE | Admit: 2012-01-02 | Discharge: 2012-01-02 | Disposition: A | Discharge status: Home / Self Care | Payer: Medicare Other | Source: Ambulatory Visit | Attending: Cardiovascular Disease | Admitting: Cardiovascular Disease

## 2012-01-02 DIAGNOSIS — E785 Hyperlipidemia, unspecified: Secondary | ICD-10-CM | POA: Diagnosis not present

## 2012-01-02 DIAGNOSIS — F17200 Nicotine dependence, unspecified, uncomplicated: Secondary | ICD-10-CM | POA: Insufficient documentation

## 2012-01-02 DIAGNOSIS — I2510 Atherosclerotic heart disease of native coronary artery without angina pectoris: Secondary | ICD-10-CM | POA: Insufficient documentation

## 2012-01-02 DIAGNOSIS — K219 Gastro-esophageal reflux disease without esophagitis: Secondary | ICD-10-CM | POA: Insufficient documentation

## 2012-01-02 DIAGNOSIS — I2589 Other forms of chronic ischemic heart disease: Secondary | ICD-10-CM | POA: Diagnosis not present

## 2012-01-02 DIAGNOSIS — Z5189 Encounter for other specified aftercare: Secondary | ICD-10-CM | POA: Insufficient documentation

## 2012-01-02 DIAGNOSIS — I719 Aortic aneurysm of unspecified site, without rupture: Secondary | ICD-10-CM | POA: Diagnosis not present

## 2012-01-05 ENCOUNTER — Encounter (HOSPITAL_COMMUNITY): Payer: Medicare Other

## 2012-01-05 ENCOUNTER — Encounter (HOSPITAL_COMMUNITY)
Admission: RE | Admit: 2012-01-05 | Discharge: 2012-01-05 | Disposition: A | Discharge status: Home / Self Care | Payer: Medicare Other | Source: Ambulatory Visit | Attending: Cardiovascular Disease | Admitting: Cardiovascular Disease

## 2012-01-07 ENCOUNTER — Encounter (HOSPITAL_COMMUNITY): Payer: Medicare Other

## 2012-01-07 ENCOUNTER — Encounter (HOSPITAL_COMMUNITY)
Admission: RE | Admit: 2012-01-07 | Discharge: 2012-01-07 | Disposition: A | Discharge status: Home / Self Care | Payer: Medicare Other | Source: Ambulatory Visit | Attending: Cardiovascular Disease | Admitting: Cardiovascular Disease

## 2012-01-09 ENCOUNTER — Encounter (HOSPITAL_COMMUNITY): Payer: Medicare Other

## 2012-01-09 ENCOUNTER — Encounter (HOSPITAL_COMMUNITY)
Admission: RE | Admit: 2012-01-09 | Discharge: 2012-01-09 | Disposition: A | Discharge status: Home / Self Care | Payer: Medicare Other | Source: Ambulatory Visit | Attending: Cardiovascular Disease | Admitting: Cardiovascular Disease

## 2012-01-12 ENCOUNTER — Encounter (HOSPITAL_COMMUNITY)
Admission: RE | Admit: 2012-01-12 | Discharge: 2012-01-12 | Disposition: A | Discharge status: Home / Self Care | Payer: Medicare Other | Source: Ambulatory Visit | Attending: Cardiovascular Disease | Admitting: Cardiovascular Disease

## 2012-01-12 ENCOUNTER — Encounter (HOSPITAL_COMMUNITY): Payer: Medicare Other

## 2012-01-14 ENCOUNTER — Encounter (HOSPITAL_COMMUNITY)
Admission: RE | Admit: 2012-01-14 | Discharge: 2012-01-14 | Disposition: A | Discharge status: Home / Self Care | Payer: Medicare Other | Source: Ambulatory Visit | Attending: Cardiovascular Disease | Admitting: Cardiovascular Disease

## 2012-01-14 ENCOUNTER — Encounter (HOSPITAL_COMMUNITY): Payer: Medicare Other

## 2012-01-15 DIAGNOSIS — Z79899 Other long term (current) drug therapy: Secondary | ICD-10-CM | POA: Diagnosis not present

## 2012-01-16 ENCOUNTER — Encounter (HOSPITAL_COMMUNITY): Payer: Medicare Other

## 2012-01-16 ENCOUNTER — Encounter (HOSPITAL_COMMUNITY)
Admission: RE | Admit: 2012-01-16 | Discharge: 2012-01-16 | Disposition: A | Discharge status: Home / Self Care | Payer: Medicare Other | Source: Ambulatory Visit | Attending: Cardiovascular Disease | Admitting: Cardiovascular Disease

## 2012-01-19 ENCOUNTER — Encounter (HOSPITAL_COMMUNITY)
Admission: RE | Admit: 2012-01-19 | Discharge: 2012-01-19 | Disposition: A | Discharge status: Home / Self Care | Payer: Medicare Other | Source: Ambulatory Visit | Attending: Cardiovascular Disease | Admitting: Cardiovascular Disease

## 2012-01-19 ENCOUNTER — Encounter (HOSPITAL_COMMUNITY): Payer: Medicare Other

## 2012-01-21 ENCOUNTER — Encounter (HOSPITAL_COMMUNITY): Payer: Medicare Other

## 2012-01-21 ENCOUNTER — Encounter (HOSPITAL_COMMUNITY)
Admission: RE | Admit: 2012-01-21 | Discharge: 2012-01-21 | Disposition: A | Discharge status: Home / Self Care | Payer: Medicare Other | Source: Ambulatory Visit | Attending: Cardiovascular Disease | Admitting: Cardiovascular Disease

## 2012-01-23 ENCOUNTER — Encounter (HOSPITAL_COMMUNITY): Payer: Medicare Other

## 2012-01-23 ENCOUNTER — Encounter (HOSPITAL_COMMUNITY)
Admission: RE | Admit: 2012-01-23 | Discharge: 2012-01-23 | Disposition: A | Discharge status: Home / Self Care | Payer: Medicare Other | Source: Ambulatory Visit | Attending: Cardiovascular Disease | Admitting: Cardiovascular Disease

## 2012-01-26 ENCOUNTER — Encounter (HOSPITAL_COMMUNITY): Admission: RE | Admit: 2012-01-26 | Payer: Medicare Other | Source: Ambulatory Visit

## 2012-01-26 ENCOUNTER — Encounter (HOSPITAL_COMMUNITY): Payer: Medicare Other

## 2012-01-26 DIAGNOSIS — I2589 Other forms of chronic ischemic heart disease: Secondary | ICD-10-CM | POA: Diagnosis not present

## 2012-01-26 DIAGNOSIS — E782 Mixed hyperlipidemia: Secondary | ICD-10-CM | POA: Diagnosis not present

## 2012-01-26 DIAGNOSIS — I252 Old myocardial infarction: Secondary | ICD-10-CM | POA: Diagnosis not present

## 2012-01-28 ENCOUNTER — Encounter (HOSPITAL_COMMUNITY): Payer: Medicare Other

## 2012-01-30 ENCOUNTER — Encounter (HOSPITAL_COMMUNITY): Payer: Medicare Other

## 2012-02-02 ENCOUNTER — Encounter (HOSPITAL_COMMUNITY): Payer: Medicare Other

## 2012-02-04 ENCOUNTER — Encounter (HOSPITAL_COMMUNITY): Payer: Medicare Other

## 2012-02-06 ENCOUNTER — Encounter (HOSPITAL_COMMUNITY): Payer: Medicare Other

## 2012-02-17 DIAGNOSIS — I252 Old myocardial infarction: Secondary | ICD-10-CM | POA: Diagnosis not present

## 2012-02-27 DIAGNOSIS — E782 Mixed hyperlipidemia: Secondary | ICD-10-CM | POA: Diagnosis not present

## 2012-02-27 DIAGNOSIS — I2589 Other forms of chronic ischemic heart disease: Secondary | ICD-10-CM | POA: Diagnosis not present

## 2012-02-27 DIAGNOSIS — I252 Old myocardial infarction: Secondary | ICD-10-CM | POA: Diagnosis not present

## 2012-03-15 ENCOUNTER — Encounter (HOSPITAL_COMMUNITY)
Admission: RE | Admit: 2012-03-15 | Discharge: 2012-03-15 | Disposition: A | Discharge status: Home / Self Care | Payer: Self-pay | Source: Ambulatory Visit | Attending: Cardiovascular Disease | Admitting: Cardiovascular Disease

## 2012-03-15 DIAGNOSIS — I719 Aortic aneurysm of unspecified site, without rupture: Secondary | ICD-10-CM | POA: Insufficient documentation

## 2012-03-15 DIAGNOSIS — I2589 Other forms of chronic ischemic heart disease: Secondary | ICD-10-CM | POA: Insufficient documentation

## 2012-03-15 DIAGNOSIS — I2510 Atherosclerotic heart disease of native coronary artery without angina pectoris: Secondary | ICD-10-CM | POA: Insufficient documentation

## 2012-03-15 DIAGNOSIS — F17200 Nicotine dependence, unspecified, uncomplicated: Secondary | ICD-10-CM | POA: Insufficient documentation

## 2012-03-15 DIAGNOSIS — Z5189 Encounter for other specified aftercare: Secondary | ICD-10-CM | POA: Insufficient documentation

## 2012-03-15 DIAGNOSIS — K219 Gastro-esophageal reflux disease without esophagitis: Secondary | ICD-10-CM | POA: Insufficient documentation

## 2012-03-15 DIAGNOSIS — E785 Hyperlipidemia, unspecified: Secondary | ICD-10-CM | POA: Insufficient documentation

## 2012-03-17 ENCOUNTER — Encounter (HOSPITAL_COMMUNITY)
Admission: RE | Admit: 2012-03-17 | Discharge: 2012-03-17 | Disposition: A | Discharge status: Home / Self Care | Payer: Self-pay | Source: Ambulatory Visit | Attending: Cardiovascular Disease | Admitting: Cardiovascular Disease

## 2012-03-19 ENCOUNTER — Encounter (HOSPITAL_COMMUNITY)
Admission: RE | Admit: 2012-03-19 | Discharge: 2012-03-19 | Disposition: A | Discharge status: Home / Self Care | Payer: Self-pay | Source: Ambulatory Visit | Attending: Cardiovascular Disease | Admitting: Cardiovascular Disease

## 2012-03-22 ENCOUNTER — Encounter (HOSPITAL_COMMUNITY)
Admission: RE | Admit: 2012-03-22 | Discharge: 2012-03-22 | Disposition: A | Discharge status: Home / Self Care | Payer: Self-pay | Source: Ambulatory Visit | Attending: Cardiovascular Disease | Admitting: Cardiovascular Disease

## 2012-03-24 ENCOUNTER — Encounter (HOSPITAL_COMMUNITY)
Admission: RE | Admit: 2012-03-24 | Discharge: 2012-03-24 | Disposition: A | Discharge status: Home / Self Care | Payer: Self-pay | Source: Ambulatory Visit | Attending: Cardiovascular Disease | Admitting: Cardiovascular Disease

## 2012-03-26 ENCOUNTER — Encounter (HOSPITAL_COMMUNITY)
Admission: RE | Admit: 2012-03-26 | Discharge: 2012-03-26 | Disposition: A | Discharge status: Home / Self Care | Payer: Self-pay | Source: Ambulatory Visit | Attending: Cardiovascular Disease | Admitting: Cardiovascular Disease

## 2012-03-29 ENCOUNTER — Encounter (HOSPITAL_COMMUNITY)
Admission: RE | Admit: 2012-03-29 | Discharge: 2012-03-29 | Disposition: A | Discharge status: Home / Self Care | Payer: Self-pay | Source: Ambulatory Visit | Attending: Cardiovascular Disease | Admitting: Cardiovascular Disease

## 2012-03-31 ENCOUNTER — Encounter (HOSPITAL_COMMUNITY)
Admission: RE | Admit: 2012-03-31 | Discharge: 2012-03-31 | Disposition: A | Discharge status: Home / Self Care | Payer: Self-pay | Source: Ambulatory Visit | Attending: Cardiovascular Disease | Admitting: Cardiovascular Disease

## 2012-04-02 ENCOUNTER — Encounter (HOSPITAL_COMMUNITY)
Admission: RE | Admit: 2012-04-02 | Discharge: 2012-04-02 | Disposition: A | Discharge status: Home / Self Care | Payer: Self-pay | Source: Ambulatory Visit | Attending: Cardiovascular Disease | Admitting: Cardiovascular Disease

## 2012-04-02 DIAGNOSIS — I719 Aortic aneurysm of unspecified site, without rupture: Secondary | ICD-10-CM | POA: Insufficient documentation

## 2012-04-02 DIAGNOSIS — E785 Hyperlipidemia, unspecified: Secondary | ICD-10-CM | POA: Insufficient documentation

## 2012-04-02 DIAGNOSIS — I2589 Other forms of chronic ischemic heart disease: Secondary | ICD-10-CM | POA: Insufficient documentation

## 2012-04-02 DIAGNOSIS — I2510 Atherosclerotic heart disease of native coronary artery without angina pectoris: Secondary | ICD-10-CM | POA: Insufficient documentation

## 2012-04-02 DIAGNOSIS — F17200 Nicotine dependence, unspecified, uncomplicated: Secondary | ICD-10-CM | POA: Insufficient documentation

## 2012-04-02 DIAGNOSIS — Z5189 Encounter for other specified aftercare: Secondary | ICD-10-CM | POA: Insufficient documentation

## 2012-04-02 DIAGNOSIS — K219 Gastro-esophageal reflux disease without esophagitis: Secondary | ICD-10-CM | POA: Insufficient documentation

## 2012-04-05 ENCOUNTER — Encounter (HOSPITAL_COMMUNITY)
Admission: RE | Admit: 2012-04-05 | Discharge: 2012-04-05 | Disposition: A | Discharge status: Home / Self Care | Payer: Self-pay | Source: Ambulatory Visit | Attending: Cardiovascular Disease | Admitting: Cardiovascular Disease

## 2012-04-07 ENCOUNTER — Encounter (HOSPITAL_COMMUNITY)
Admission: RE | Admit: 2012-04-07 | Discharge: 2012-04-07 | Disposition: A | Discharge status: Home / Self Care | Payer: Self-pay | Source: Ambulatory Visit | Attending: Cardiovascular Disease | Admitting: Cardiovascular Disease

## 2012-04-09 ENCOUNTER — Encounter (HOSPITAL_COMMUNITY)
Admission: RE | Admit: 2012-04-09 | Discharge: 2012-04-09 | Disposition: A | Discharge status: Home / Self Care | Payer: Self-pay | Source: Ambulatory Visit | Attending: Cardiovascular Disease | Admitting: Cardiovascular Disease

## 2012-04-12 ENCOUNTER — Encounter (HOSPITAL_COMMUNITY)
Admission: RE | Admit: 2012-04-12 | Discharge: 2012-04-12 | Disposition: A | Discharge status: Home / Self Care | Payer: Self-pay | Source: Ambulatory Visit | Attending: Cardiovascular Disease | Admitting: Cardiovascular Disease

## 2012-04-14 ENCOUNTER — Encounter (HOSPITAL_COMMUNITY)
Admission: RE | Admit: 2012-04-14 | Discharge: 2012-04-14 | Disposition: A | Discharge status: Home / Self Care | Payer: Self-pay | Source: Ambulatory Visit | Attending: Cardiovascular Disease | Admitting: Cardiovascular Disease

## 2012-04-16 ENCOUNTER — Encounter (HOSPITAL_COMMUNITY)
Admission: RE | Admit: 2012-04-16 | Discharge: 2012-04-16 | Disposition: A | Discharge status: Home / Self Care | Payer: Self-pay | Source: Ambulatory Visit | Attending: Cardiovascular Disease | Admitting: Cardiovascular Disease

## 2012-04-19 ENCOUNTER — Encounter (HOSPITAL_COMMUNITY)
Admission: RE | Admit: 2012-04-19 | Discharge: 2012-04-19 | Disposition: A | Discharge status: Home / Self Care | Payer: Self-pay | Source: Ambulatory Visit | Attending: Cardiovascular Disease | Admitting: Cardiovascular Disease

## 2012-04-21 ENCOUNTER — Encounter (HOSPITAL_COMMUNITY)
Admission: RE | Admit: 2012-04-21 | Discharge: 2012-04-21 | Disposition: A | Discharge status: Home / Self Care | Payer: Self-pay | Source: Ambulatory Visit | Attending: Cardiovascular Disease | Admitting: Cardiovascular Disease

## 2012-04-23 ENCOUNTER — Encounter (HOSPITAL_COMMUNITY)
Admission: RE | Admit: 2012-04-23 | Discharge: 2012-04-23 | Disposition: A | Discharge status: Home / Self Care | Payer: Self-pay | Source: Ambulatory Visit | Attending: Cardiovascular Disease | Admitting: Cardiovascular Disease

## 2012-04-26 ENCOUNTER — Encounter (HOSPITAL_COMMUNITY)
Admission: RE | Admit: 2012-04-26 | Discharge: 2012-04-26 | Disposition: A | Discharge status: Home / Self Care | Payer: Self-pay | Source: Ambulatory Visit | Attending: Cardiovascular Disease | Admitting: Cardiovascular Disease

## 2012-04-28 ENCOUNTER — Encounter (HOSPITAL_COMMUNITY)
Admission: RE | Admit: 2012-04-28 | Discharge: 2012-04-28 | Disposition: A | Discharge status: Home / Self Care | Payer: Self-pay | Source: Ambulatory Visit | Attending: Cardiovascular Disease | Admitting: Cardiovascular Disease

## 2012-05-03 ENCOUNTER — Encounter (HOSPITAL_COMMUNITY)
Admission: RE | Admit: 2012-05-03 | Discharge: 2012-05-03 | Disposition: A | Discharge status: Home / Self Care | Payer: Self-pay | Source: Ambulatory Visit | Attending: Cardiovascular Disease | Admitting: Cardiovascular Disease

## 2012-05-03 DIAGNOSIS — Z5189 Encounter for other specified aftercare: Secondary | ICD-10-CM | POA: Insufficient documentation

## 2012-05-03 DIAGNOSIS — I2510 Atherosclerotic heart disease of native coronary artery without angina pectoris: Secondary | ICD-10-CM | POA: Insufficient documentation

## 2012-05-03 DIAGNOSIS — I2589 Other forms of chronic ischemic heart disease: Secondary | ICD-10-CM | POA: Insufficient documentation

## 2012-05-03 DIAGNOSIS — I719 Aortic aneurysm of unspecified site, without rupture: Secondary | ICD-10-CM | POA: Insufficient documentation

## 2012-05-03 DIAGNOSIS — E785 Hyperlipidemia, unspecified: Secondary | ICD-10-CM | POA: Insufficient documentation

## 2012-05-03 DIAGNOSIS — F17200 Nicotine dependence, unspecified, uncomplicated: Secondary | ICD-10-CM | POA: Insufficient documentation

## 2012-05-03 DIAGNOSIS — K219 Gastro-esophageal reflux disease without esophagitis: Secondary | ICD-10-CM | POA: Insufficient documentation

## 2012-05-05 ENCOUNTER — Encounter (HOSPITAL_COMMUNITY)
Admission: RE | Admit: 2012-05-05 | Discharge: 2012-05-05 | Disposition: A | Discharge status: Home / Self Care | Payer: Self-pay | Source: Ambulatory Visit | Attending: Cardiovascular Disease | Admitting: Cardiovascular Disease

## 2012-05-06 ENCOUNTER — Other Ambulatory Visit (HOSPITAL_COMMUNITY): Payer: Self-pay | Admitting: Cardiovascular Disease

## 2012-05-06 DIAGNOSIS — I219 Acute myocardial infarction, unspecified: Secondary | ICD-10-CM

## 2012-05-06 DIAGNOSIS — I2510 Atherosclerotic heart disease of native coronary artery without angina pectoris: Secondary | ICD-10-CM

## 2012-05-06 DIAGNOSIS — Z23 Encounter for immunization: Secondary | ICD-10-CM | POA: Diagnosis not present

## 2012-05-07 ENCOUNTER — Encounter (HOSPITAL_COMMUNITY)
Admission: RE | Admit: 2012-05-07 | Discharge: 2012-05-07 | Disposition: A | Discharge status: Home / Self Care | Payer: Self-pay | Source: Ambulatory Visit | Attending: Cardiovascular Disease | Admitting: Cardiovascular Disease

## 2012-05-10 ENCOUNTER — Encounter (HOSPITAL_COMMUNITY)
Admission: RE | Admit: 2012-05-10 | Discharge: 2012-05-10 | Disposition: A | Discharge status: Home / Self Care | Payer: Self-pay | Source: Ambulatory Visit | Attending: Cardiovascular Disease | Admitting: Cardiovascular Disease

## 2012-05-12 ENCOUNTER — Encounter (HOSPITAL_COMMUNITY)
Admission: RE | Admit: 2012-05-12 | Discharge: 2012-05-12 | Disposition: A | Discharge status: Home / Self Care | Payer: Self-pay | Source: Ambulatory Visit | Attending: Cardiovascular Disease | Admitting: Cardiovascular Disease

## 2012-05-14 ENCOUNTER — Encounter (HOSPITAL_COMMUNITY)
Admission: RE | Admit: 2012-05-14 | Discharge: 2012-05-14 | Disposition: A | Discharge status: Home / Self Care | Payer: Self-pay | Source: Ambulatory Visit | Attending: Cardiovascular Disease | Admitting: Cardiovascular Disease

## 2012-05-17 ENCOUNTER — Encounter (HOSPITAL_COMMUNITY)
Admission: RE | Admit: 2012-05-17 | Discharge: 2012-05-17 | Disposition: A | Discharge status: Home / Self Care | Payer: Self-pay | Source: Ambulatory Visit | Attending: Cardiovascular Disease | Admitting: Cardiovascular Disease

## 2012-05-18 DIAGNOSIS — R5383 Other fatigue: Secondary | ICD-10-CM | POA: Diagnosis not present

## 2012-05-18 DIAGNOSIS — E782 Mixed hyperlipidemia: Secondary | ICD-10-CM | POA: Diagnosis not present

## 2012-05-18 DIAGNOSIS — R5381 Other malaise: Secondary | ICD-10-CM | POA: Diagnosis not present

## 2012-05-18 DIAGNOSIS — Z79899 Other long term (current) drug therapy: Secondary | ICD-10-CM | POA: Diagnosis not present

## 2012-05-19 ENCOUNTER — Encounter (HOSPITAL_COMMUNITY)
Admission: RE | Admit: 2012-05-19 | Discharge: 2012-05-19 | Disposition: A | Discharge status: Home / Self Care | Payer: Self-pay | Source: Ambulatory Visit | Attending: Cardiovascular Disease | Admitting: Cardiovascular Disease

## 2012-05-20 ENCOUNTER — Ambulatory Visit (HOSPITAL_COMMUNITY)
Admission: RE | Admit: 2012-05-20 | Discharge: 2012-05-20 | Disposition: A | Discharge status: Home / Self Care | Payer: Medicare Other | Source: Ambulatory Visit | Attending: Cardiovascular Disease | Admitting: Cardiovascular Disease

## 2012-05-20 DIAGNOSIS — I219 Acute myocardial infarction, unspecified: Secondary | ICD-10-CM

## 2012-05-20 DIAGNOSIS — I4510 Unspecified right bundle-branch block: Secondary | ICD-10-CM | POA: Insufficient documentation

## 2012-05-20 DIAGNOSIS — I2510 Atherosclerotic heart disease of native coronary artery without angina pectoris: Secondary | ICD-10-CM

## 2012-05-20 DIAGNOSIS — I252 Old myocardial infarction: Secondary | ICD-10-CM | POA: Insufficient documentation

## 2012-05-20 MED ORDER — REGADENOSON 0.4 MG/5ML IV SOLN
0.4000 mg | Freq: Once | INTRAVENOUS | Status: AC
  Administered 2012-05-20: 0.4 mg via INTRAVENOUS

## 2012-05-20 MED ORDER — TECHNETIUM TC 99M SESTAMIBI GENERIC - CARDIOLITE
30.8000 | Freq: Once | INTRAVENOUS | Status: AC | PRN
  Administered 2012-05-20: 30.8 via INTRAVENOUS

## 2012-05-20 MED ORDER — TECHNETIUM TC 99M SESTAMIBI GENERIC - CARDIOLITE
10.6000 | Freq: Once | INTRAVENOUS | Status: AC | PRN
  Administered 2012-05-20: 11 via INTRAVENOUS

## 2012-05-20 NOTE — Procedures (Addendum)
[facility] [address] [place] [id number]  Cardiology Nuclear Med Study  Stephen [name] is a 71 y.o. male     MRN : [id number]     DOB: June 05, 1940  Procedure Date: 05/20/2012  Nuclear Med Background Indication for Stress Test:  Evaluation for Ischemia and Stent Patency History:  CAD, MI 4/13 Cardiac Risk Factors: Hypertension, Lipids and Smoker  Symptoms:  None   Nuclear Pre-Procedure Caffeine/Decaff Intake:  7:00pm NPO After: 5:00am   IV Site: R Antecubital  IV 0.9% NS with Angio Cath:  22g  Chest Size (in):  42 IV Started by: Otho Perl, CNMT  Height: 5\' 7"  (1.702 m)  Cup Size: n/a  BMI:  Body mass index is 21.93 kg/(m^2). Weight:  140 lb (63.504 kg)   Tech Comments:  n/a    Nuclear Med Study 1 or 2 day study: 1 day  Stress Test Type:  [facility] [place] Provider:  Shelva Majestic, MD   Resting Radionuclide: Technetium 96m Sestamibi  Resting Radionuclide Dose: 10.6 mCi   Stress Radionuclide:  Technetium 66m Sestamibi  Stress Radionuclide Dose: 30.8 mCi           Stress Protocol Rest HR: 48 Stress HR:76  Rest BP: 105/69 Stress BP: 121/76  Exercise Time (min): n/a METS: n/a          Dose of Adenosine (mg):  n/a Dose of Lexiscan: 0.4 mg  Dose of Atropine (mg): n/a Dose of Dobutamine: n/a mcg/kg/min (at max HR)  Stress Test Technologist: Mellody Memos, CCT Nuclear Technologist: Imagene Riches, CNMT   Rest Procedure:  Myocardial perfusion imaging was performed at rest 45 minutes following the intravenous administration of Technetium 17m Sestamibi. Stress Procedure:  The patient received IV Lexiscan 0.4 mg over 15-seconds.  Technetium 44m Sestamibi injected at 30-seconds.  There were no significant changes with Lexiscan.  Quantitative spect images were obtained after a 45 minute delay.  Transient Ischemic Dilatation (Normal <1.22):  1.17 Lung/Heart Ratio (Normal <0.45):  0.31 QGS  EDV:  149 ml QGS ESV:  95 ml LV Ejection Fraction: 36%  Signed by       Rest ECG: RBBB, Ant MI  Stress ECG: No significant change from baseline ECG  QPS Raw Data Images:  Normal; no motion artifact; normal heart/lung ratio. Stress Images:  Large area of hypoperfusion in the LAD territory Rest Images:  There is decreased uptake in the anterior wall, apex, septum and inferior wall Subtraction (SDS):  No evidence of ischemia.  Impression Exercise Capacity:  Lexiscan with no exercise. BP Response:  Normal blood pressure response. Clinical Symptoms:  No significant symptoms noted. ECG Impression:  No significant ST segment change suggestive of ischemia. Comparison with Prior Nuclear Study: No images to compare  Overall Impression:  Low risk stress nuclear study.  LV Wall Motion:  NL LV Function; NL Wall Motion or Not performed  There is a large scar in the LAD territory w/o ischemia.   Lorretta Harp, MD  05/20/2012 6:23 PM

## 2012-05-21 ENCOUNTER — Encounter (HOSPITAL_COMMUNITY): Payer: Self-pay

## 2012-05-24 ENCOUNTER — Encounter (HOSPITAL_COMMUNITY)
Admission: RE | Admit: 2012-05-24 | Discharge: 2012-05-24 | Disposition: A | Discharge status: Home / Self Care | Payer: Self-pay | Source: Ambulatory Visit | Attending: Cardiovascular Disease | Admitting: Cardiovascular Disease

## 2012-05-28 ENCOUNTER — Encounter (HOSPITAL_COMMUNITY): Payer: Self-pay

## 2012-05-31 ENCOUNTER — Encounter (HOSPITAL_COMMUNITY): Payer: Self-pay

## 2012-05-31 DIAGNOSIS — I252 Old myocardial infarction: Secondary | ICD-10-CM | POA: Diagnosis not present

## 2012-05-31 DIAGNOSIS — E782 Mixed hyperlipidemia: Secondary | ICD-10-CM | POA: Diagnosis not present

## 2012-05-31 DIAGNOSIS — I2510 Atherosclerotic heart disease of native coronary artery without angina pectoris: Secondary | ICD-10-CM | POA: Diagnosis not present

## 2012-05-31 DIAGNOSIS — I359 Nonrheumatic aortic valve disorder, unspecified: Secondary | ICD-10-CM | POA: Diagnosis not present

## 2012-06-04 ENCOUNTER — Encounter (HOSPITAL_COMMUNITY): Payer: Medicare Other

## 2012-06-07 ENCOUNTER — Encounter (HOSPITAL_COMMUNITY): Payer: Medicare Other

## 2012-06-09 ENCOUNTER — Encounter (HOSPITAL_COMMUNITY): Payer: Medicare Other

## 2012-06-11 ENCOUNTER — Encounter (HOSPITAL_COMMUNITY): Payer: Medicare Other

## 2012-06-14 ENCOUNTER — Encounter (HOSPITAL_COMMUNITY): Payer: Medicare Other

## 2012-06-16 ENCOUNTER — Encounter (HOSPITAL_COMMUNITY): Payer: Medicare Other

## 2012-06-18 ENCOUNTER — Encounter (HOSPITAL_COMMUNITY): Payer: Medicare Other

## 2012-06-21 ENCOUNTER — Encounter (HOSPITAL_COMMUNITY): Payer: Medicare Other

## 2012-06-23 ENCOUNTER — Encounter (HOSPITAL_COMMUNITY): Payer: Medicare Other

## 2012-06-25 ENCOUNTER — Encounter (HOSPITAL_COMMUNITY): Payer: Medicare Other

## 2012-06-28 ENCOUNTER — Encounter (HOSPITAL_COMMUNITY): Payer: Medicare Other

## 2012-06-30 ENCOUNTER — Encounter (HOSPITAL_COMMUNITY): Payer: Medicare Other

## 2012-07-02 ENCOUNTER — Encounter (HOSPITAL_COMMUNITY): Payer: Medicare Other

## 2012-07-05 ENCOUNTER — Encounter (HOSPITAL_COMMUNITY): Payer: Medicare Other

## 2012-07-07 ENCOUNTER — Encounter (HOSPITAL_COMMUNITY): Payer: Medicare Other

## 2012-07-09 ENCOUNTER — Encounter (HOSPITAL_COMMUNITY): Payer: Medicare Other

## 2012-07-12 ENCOUNTER — Encounter (HOSPITAL_COMMUNITY): Payer: Medicare Other

## 2012-07-14 ENCOUNTER — Encounter (HOSPITAL_COMMUNITY): Payer: Medicare Other

## 2012-07-16 ENCOUNTER — Encounter (HOSPITAL_COMMUNITY): Payer: Medicare Other

## 2012-07-19 ENCOUNTER — Encounter (HOSPITAL_COMMUNITY): Payer: Medicare Other

## 2012-07-21 ENCOUNTER — Encounter (HOSPITAL_COMMUNITY): Payer: Medicare Other

## 2012-07-23 ENCOUNTER — Encounter (HOSPITAL_COMMUNITY): Payer: Medicare Other

## 2012-07-26 ENCOUNTER — Encounter (HOSPITAL_COMMUNITY): Payer: Medicare Other

## 2012-07-28 ENCOUNTER — Encounter (HOSPITAL_COMMUNITY): Payer: Medicare Other

## 2012-07-30 ENCOUNTER — Encounter (HOSPITAL_COMMUNITY): Payer: Medicare Other

## 2012-08-02 ENCOUNTER — Encounter (HOSPITAL_COMMUNITY): Payer: Medicare Other

## 2012-08-04 ENCOUNTER — Encounter (HOSPITAL_COMMUNITY): Payer: Medicare Other

## 2012-08-06 ENCOUNTER — Encounter (HOSPITAL_COMMUNITY): Payer: Medicare Other

## 2012-08-09 ENCOUNTER — Encounter (HOSPITAL_COMMUNITY): Payer: Medicare Other

## 2012-08-11 ENCOUNTER — Encounter (HOSPITAL_COMMUNITY): Payer: Medicare Other

## 2012-08-13 ENCOUNTER — Encounter (HOSPITAL_COMMUNITY): Payer: Medicare Other

## 2012-08-16 ENCOUNTER — Encounter (HOSPITAL_COMMUNITY): Payer: Medicare Other

## 2012-08-18 ENCOUNTER — Encounter (HOSPITAL_COMMUNITY): Payer: Medicare Other

## 2012-08-20 ENCOUNTER — Encounter (HOSPITAL_COMMUNITY): Payer: Medicare Other

## 2012-08-23 ENCOUNTER — Encounter (HOSPITAL_COMMUNITY): Payer: Medicare Other

## 2012-08-25 ENCOUNTER — Encounter (HOSPITAL_COMMUNITY): Payer: Medicare Other

## 2012-08-27 ENCOUNTER — Encounter (HOSPITAL_COMMUNITY): Payer: Medicare Other

## 2012-08-30 ENCOUNTER — Encounter (HOSPITAL_COMMUNITY): Payer: Medicare Other

## 2012-09-01 ENCOUNTER — Encounter (HOSPITAL_COMMUNITY): Payer: Medicare Other

## 2012-09-03 ENCOUNTER — Encounter (HOSPITAL_COMMUNITY): Payer: Medicare Other

## 2012-09-06 ENCOUNTER — Encounter (HOSPITAL_COMMUNITY): Payer: Medicare Other

## 2012-09-08 ENCOUNTER — Encounter (HOSPITAL_COMMUNITY): Payer: Medicare Other

## 2012-09-10 ENCOUNTER — Encounter (HOSPITAL_COMMUNITY): Payer: Medicare Other

## 2012-09-13 ENCOUNTER — Encounter (HOSPITAL_COMMUNITY): Payer: Medicare Other

## 2012-09-13 ENCOUNTER — Encounter: Payer: Self-pay | Admitting: *Deleted

## 2012-09-14 ENCOUNTER — Encounter: Payer: Self-pay | Admitting: Cardiovascular Disease

## 2012-09-15 ENCOUNTER — Encounter (HOSPITAL_COMMUNITY): Payer: Medicare Other

## 2012-09-17 ENCOUNTER — Encounter (HOSPITAL_COMMUNITY): Payer: Medicare Other

## 2012-09-20 ENCOUNTER — Encounter (HOSPITAL_COMMUNITY): Payer: Medicare Other

## 2012-09-22 ENCOUNTER — Encounter (HOSPITAL_COMMUNITY): Payer: Medicare Other

## 2012-09-23 ENCOUNTER — Encounter: Payer: Self-pay | Admitting: Cardiovascular Disease

## 2012-09-24 ENCOUNTER — Encounter (HOSPITAL_COMMUNITY): Payer: Medicare Other

## 2012-09-27 ENCOUNTER — Encounter (HOSPITAL_COMMUNITY): Payer: Medicare Other

## 2012-09-29 ENCOUNTER — Encounter (HOSPITAL_COMMUNITY): Payer: Medicare Other

## 2012-09-30 DIAGNOSIS — I252 Old myocardial infarction: Secondary | ICD-10-CM | POA: Diagnosis not present

## 2012-09-30 DIAGNOSIS — I2589 Other forms of chronic ischemic heart disease: Secondary | ICD-10-CM | POA: Diagnosis not present

## 2012-09-30 DIAGNOSIS — E782 Mixed hyperlipidemia: Secondary | ICD-10-CM | POA: Diagnosis not present

## 2012-10-01 ENCOUNTER — Encounter (HOSPITAL_COMMUNITY): Payer: Medicare Other

## 2012-10-04 ENCOUNTER — Encounter (HOSPITAL_COMMUNITY): Payer: Medicare Other

## 2012-10-06 ENCOUNTER — Encounter (HOSPITAL_COMMUNITY): Payer: Medicare Other

## 2012-10-08 ENCOUNTER — Encounter (HOSPITAL_COMMUNITY): Payer: Medicare Other

## 2012-10-11 ENCOUNTER — Encounter (HOSPITAL_COMMUNITY): Payer: Medicare Other

## 2012-10-13 ENCOUNTER — Encounter (HOSPITAL_COMMUNITY): Payer: Medicare Other

## 2012-10-15 ENCOUNTER — Encounter (HOSPITAL_COMMUNITY): Payer: Medicare Other

## 2012-10-18 ENCOUNTER — Encounter (HOSPITAL_COMMUNITY): Payer: Medicare Other

## 2012-10-20 ENCOUNTER — Encounter (HOSPITAL_COMMUNITY): Payer: Medicare Other

## 2012-10-22 ENCOUNTER — Encounter (HOSPITAL_COMMUNITY): Payer: Medicare Other

## 2012-10-26 ENCOUNTER — Other Ambulatory Visit: Payer: Self-pay | Admitting: *Deleted

## 2012-10-26 MED ORDER — ATORVASTATIN CALCIUM 20 MG PO TABS: 20.0000 mg | ORAL_TABLET | Freq: Every day | ORAL | Status: DC

## 2012-10-26 MED ORDER — TICAGRELOR 90 MG PO TABS: 90.0000 mg | ORAL_TABLET | Freq: Two times a day (BID) | ORAL | Status: DC

## 2012-10-26 MED ORDER — EZETIMIBE 10 MG PO TABS: 10.0000 mg | ORAL_TABLET | Freq: Every day | ORAL | Status: DC

## 2012-10-27 ENCOUNTER — Encounter (HOSPITAL_COMMUNITY): Payer: Medicare Other

## 2012-10-29 ENCOUNTER — Encounter (HOSPITAL_COMMUNITY): Payer: Medicare Other

## 2012-11-01 ENCOUNTER — Encounter (HOSPITAL_COMMUNITY): Payer: Medicare Other

## 2012-11-03 ENCOUNTER — Encounter (HOSPITAL_COMMUNITY): Payer: Medicare Other

## 2012-11-05 ENCOUNTER — Encounter (HOSPITAL_COMMUNITY): Payer: Medicare Other

## 2012-11-08 ENCOUNTER — Encounter (HOSPITAL_COMMUNITY): Payer: Medicare Other

## 2012-11-10 ENCOUNTER — Encounter (HOSPITAL_COMMUNITY): Payer: Medicare Other

## 2012-11-12 ENCOUNTER — Encounter (HOSPITAL_COMMUNITY): Payer: Medicare Other

## 2012-11-15 ENCOUNTER — Encounter (HOSPITAL_COMMUNITY): Payer: Medicare Other

## 2012-11-17 ENCOUNTER — Encounter (HOSPITAL_COMMUNITY): Payer: Medicare Other

## 2012-11-19 ENCOUNTER — Encounter (HOSPITAL_COMMUNITY): Payer: Medicare Other

## 2012-11-22 ENCOUNTER — Encounter (HOSPITAL_COMMUNITY): Payer: Medicare Other

## 2012-11-24 ENCOUNTER — Encounter (HOSPITAL_COMMUNITY): Payer: Medicare Other

## 2012-11-26 ENCOUNTER — Encounter (HOSPITAL_COMMUNITY): Payer: Medicare Other

## 2012-11-29 ENCOUNTER — Encounter (HOSPITAL_COMMUNITY): Payer: Medicare Other

## 2012-12-01 ENCOUNTER — Encounter (HOSPITAL_COMMUNITY): Payer: Medicare Other

## 2012-12-06 ENCOUNTER — Encounter (HOSPITAL_COMMUNITY): Payer: Medicare Other

## 2012-12-08 ENCOUNTER — Encounter (HOSPITAL_COMMUNITY): Payer: Medicare Other

## 2012-12-10 ENCOUNTER — Encounter (HOSPITAL_COMMUNITY): Payer: Medicare Other

## 2012-12-13 ENCOUNTER — Encounter (HOSPITAL_COMMUNITY): Payer: Medicare Other

## 2012-12-15 ENCOUNTER — Encounter (HOSPITAL_COMMUNITY): Payer: Medicare Other

## 2012-12-17 ENCOUNTER — Encounter (HOSPITAL_COMMUNITY): Payer: Medicare Other

## 2012-12-20 ENCOUNTER — Encounter (HOSPITAL_COMMUNITY): Payer: Medicare Other

## 2012-12-22 ENCOUNTER — Encounter (HOSPITAL_COMMUNITY): Payer: Medicare Other

## 2012-12-24 ENCOUNTER — Encounter (HOSPITAL_COMMUNITY): Payer: Medicare Other

## 2012-12-27 ENCOUNTER — Encounter (HOSPITAL_COMMUNITY): Payer: Medicare Other

## 2012-12-29 ENCOUNTER — Encounter (HOSPITAL_COMMUNITY): Payer: Medicare Other

## 2012-12-31 ENCOUNTER — Encounter (HOSPITAL_COMMUNITY): Payer: Medicare Other

## 2013-01-03 ENCOUNTER — Encounter (HOSPITAL_COMMUNITY): Payer: Medicare Other

## 2013-01-05 ENCOUNTER — Encounter (HOSPITAL_COMMUNITY): Payer: Medicare Other

## 2013-01-07 ENCOUNTER — Encounter (HOSPITAL_COMMUNITY): Payer: Medicare Other

## 2013-01-10 ENCOUNTER — Encounter (HOSPITAL_COMMUNITY): Payer: Medicare Other

## 2013-01-12 ENCOUNTER — Encounter (HOSPITAL_COMMUNITY): Payer: Medicare Other

## 2013-01-14 ENCOUNTER — Encounter (HOSPITAL_COMMUNITY): Payer: Medicare Other

## 2013-01-17 ENCOUNTER — Encounter (HOSPITAL_COMMUNITY): Payer: Medicare Other

## 2013-01-19 ENCOUNTER — Encounter (HOSPITAL_COMMUNITY): Payer: Medicare Other

## 2013-01-21 ENCOUNTER — Encounter (HOSPITAL_COMMUNITY): Payer: Medicare Other

## 2013-01-24 ENCOUNTER — Encounter (HOSPITAL_COMMUNITY): Payer: Medicare Other

## 2013-01-26 ENCOUNTER — Encounter (HOSPITAL_COMMUNITY): Payer: Medicare Other

## 2013-01-28 ENCOUNTER — Encounter (HOSPITAL_COMMUNITY): Payer: Medicare Other

## 2013-02-02 ENCOUNTER — Encounter (HOSPITAL_COMMUNITY): Payer: Medicare Other

## 2013-02-04 ENCOUNTER — Encounter (HOSPITAL_COMMUNITY): Payer: Medicare Other

## 2013-02-07 ENCOUNTER — Encounter (HOSPITAL_COMMUNITY): Payer: Medicare Other

## 2013-02-09 ENCOUNTER — Encounter (HOSPITAL_COMMUNITY): Payer: Medicare Other

## 2013-02-11 ENCOUNTER — Encounter (HOSPITAL_COMMUNITY): Payer: Medicare Other

## 2013-02-14 ENCOUNTER — Encounter (HOSPITAL_COMMUNITY): Payer: Medicare Other

## 2013-02-14 DIAGNOSIS — Z23 Encounter for immunization: Secondary | ICD-10-CM | POA: Diagnosis not present

## 2013-02-16 ENCOUNTER — Encounter (HOSPITAL_COMMUNITY): Payer: Medicare Other

## 2013-02-18 ENCOUNTER — Encounter (HOSPITAL_COMMUNITY): Payer: Medicare Other

## 2013-02-21 ENCOUNTER — Encounter (HOSPITAL_COMMUNITY): Payer: Medicare Other

## 2013-02-23 ENCOUNTER — Encounter (HOSPITAL_COMMUNITY): Payer: Medicare Other

## 2013-02-25 ENCOUNTER — Encounter (HOSPITAL_COMMUNITY): Payer: Medicare Other

## 2013-02-28 ENCOUNTER — Encounter (HOSPITAL_COMMUNITY): Payer: Medicare Other

## 2013-03-02 ENCOUNTER — Encounter (HOSPITAL_COMMUNITY): Payer: Medicare Other

## 2013-03-04 ENCOUNTER — Encounter (HOSPITAL_COMMUNITY): Payer: Medicare Other

## 2013-03-07 ENCOUNTER — Encounter (HOSPITAL_COMMUNITY): Payer: Medicare Other

## 2013-03-09 ENCOUNTER — Encounter (HOSPITAL_COMMUNITY): Payer: Medicare Other

## 2013-03-11 ENCOUNTER — Encounter (HOSPITAL_COMMUNITY): Payer: Medicare Other

## 2013-03-14 ENCOUNTER — Encounter (HOSPITAL_COMMUNITY): Payer: Medicare Other

## 2013-03-16 ENCOUNTER — Encounter (HOSPITAL_COMMUNITY): Payer: Medicare Other

## 2013-03-18 ENCOUNTER — Encounter (HOSPITAL_COMMUNITY): Payer: Medicare Other

## 2013-03-21 ENCOUNTER — Encounter (HOSPITAL_COMMUNITY): Payer: Medicare Other

## 2013-03-23 ENCOUNTER — Encounter (HOSPITAL_COMMUNITY): Payer: Medicare Other

## 2013-03-25 ENCOUNTER — Encounter (HOSPITAL_COMMUNITY): Payer: Medicare Other

## 2013-03-28 ENCOUNTER — Encounter (HOSPITAL_COMMUNITY): Payer: Medicare Other

## 2013-03-30 ENCOUNTER — Encounter (HOSPITAL_COMMUNITY): Payer: Medicare Other

## 2013-04-01 ENCOUNTER — Ambulatory Visit (INDEPENDENT_AMBULATORY_CARE_PROVIDER_SITE_OTHER): Payer: Medicare Other | Admitting: Cardiovascular Disease

## 2013-04-01 ENCOUNTER — Encounter: Payer: Self-pay | Admitting: Cardiovascular Disease

## 2013-04-01 ENCOUNTER — Encounter (HOSPITAL_COMMUNITY): Payer: Medicare Other

## 2013-04-01 VITALS — BP 140/70 | HR 51 | Ht 68.0 in | Wt 127.6 lb

## 2013-04-01 DIAGNOSIS — Z79899 Other long term (current) drug therapy: Secondary | ICD-10-CM

## 2013-04-01 DIAGNOSIS — R5381 Other malaise: Secondary | ICD-10-CM | POA: Diagnosis not present

## 2013-04-01 DIAGNOSIS — Z955 Presence of coronary angioplasty implant and graft: Secondary | ICD-10-CM

## 2013-04-01 DIAGNOSIS — I2589 Other forms of chronic ischemic heart disease: Secondary | ICD-10-CM

## 2013-04-01 DIAGNOSIS — E785 Hyperlipidemia, unspecified: Secondary | ICD-10-CM | POA: Diagnosis not present

## 2013-04-01 DIAGNOSIS — Z9861 Coronary angioplasty status: Secondary | ICD-10-CM

## 2013-04-01 DIAGNOSIS — I10 Essential (primary) hypertension: Secondary | ICD-10-CM

## 2013-04-01 DIAGNOSIS — I2510 Atherosclerotic heart disease of native coronary artery without angina pectoris: Secondary | ICD-10-CM

## 2013-04-01 DIAGNOSIS — I255 Ischemic cardiomyopathy: Secondary | ICD-10-CM

## 2013-04-01 MED ORDER — RAMIPRIL 5 MG PO CAPS: ORAL_CAPSULE | ORAL | Status: DC
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
## 2013-04-01 NOTE — Patient Instructions (Addendum)
Your physician recommends that you return for lab work fasting.  Your physician recommends that you schedule a follow-up appointment in: 6 months. 

## 2013-04-03 ENCOUNTER — Other Ambulatory Visit: Payer: Self-pay | Admitting: Cardiovascular Disease

## 2013-04-04 NOTE — Telephone Encounter (Signed)
Rx was sent to pharmacy electronically. 

## 2013-04-20 ENCOUNTER — Telehealth: Payer: Self-pay | Admitting: Cardiovascular Disease

## 2013-04-20 NOTE — Telephone Encounter (Signed)
Returned call and pt verified x 2.  Pt informed message received and Dr. Claiborne Billings will be notified.  Pt verbalized understanding and agreed w/ plan.  Message forwarded to Dr. Claiborne Billings.

## 2013-04-20 NOTE — Telephone Encounter (Signed)
Pt went to the dentist yesterday. The dentist did a xray he wanted the pt to notify Dr Claiborne Billings that he saw calicum built up in his artery. Dr Claiborne Billings can call Dr Creig Hines at [phone] to discuss this matter.

## 2013-04-27 NOTE — Telephone Encounter (Signed)
Pt has CAD and coronary calcification

## 2013-05-09 ENCOUNTER — Other Ambulatory Visit: Payer: Self-pay | Admitting: Cardiovascular Disease

## 2013-05-09 NOTE — Telephone Encounter (Signed)
Rx was sent to pharmacy electronically. 

## 2013-05-10 DIAGNOSIS — R5381 Other malaise: Secondary | ICD-10-CM | POA: Diagnosis not present

## 2013-05-10 DIAGNOSIS — Z79899 Other long term (current) drug therapy: Secondary | ICD-10-CM | POA: Diagnosis not present

## 2013-05-10 DIAGNOSIS — E785 Hyperlipidemia, unspecified: Secondary | ICD-10-CM | POA: Diagnosis not present

## 2013-05-10 LAB — LIPID PANEL
HDL: 35 mg/dL — ABNORMAL LOW (ref >39)
LDL Cholesterol: 70 mg/dL (ref 0–99)
Total CHOL/HDL Ratio: 3.4 Ratio

## 2013-05-10 LAB — CBC
MCV: 101 fL — ABNORMAL HIGH (ref 78.0–100.0)
Platelets: 106 10*3/uL — ABNORMAL LOW (ref 150–400)
RBC: 3.84 MIL/uL — ABNORMAL LOW (ref 4.22–5.81)
RDW: 15.6 % — ABNORMAL HIGH (ref 11.5–15.5)
WBC: 4.9 10*3/uL (ref 4.0–10.5)

## 2013-05-10 LAB — COMPREHENSIVE METABOLIC PANEL
ALT: 62 U/L — ABNORMAL HIGH (ref 0–53)
AST: 58 U/L — ABNORMAL HIGH (ref 0–37)
Albumin: 3.9 g/dL (ref 3.5–5.2)
Alkaline Phosphatase: 73 U/L (ref 39–117)
BUN: 22 mg/dL (ref 6–23)
Calcium: 9.3 mg/dL (ref 8.4–10.5)
Chloride: 109 mEq/L (ref 96–112)
Creat: 1.1 mg/dL (ref 0.50–1.35)
Potassium: 4.1 mEq/L (ref 3.5–5.3)

## 2013-05-23 ENCOUNTER — Other Ambulatory Visit: Payer: Self-pay | Admitting: *Deleted

## 2013-05-23 ENCOUNTER — Telehealth: Payer: Self-pay | Admitting: *Deleted

## 2013-05-23 DIAGNOSIS — R7989 Other specified abnormal findings of blood chemistry: Secondary | ICD-10-CM

## 2013-05-23 MED ORDER — ATORVASTATIN CALCIUM 10 MG PO TABS: ORAL_TABLET | ORAL | Status: DC

## 2013-05-23 NOTE — Telephone Encounter (Signed)
Left message to return call 

## 2013-06-28 DIAGNOSIS — R7989 Other specified abnormal findings of blood chemistry: Secondary | ICD-10-CM | POA: Diagnosis not present

## 2013-06-28 LAB — HEPATIC FUNCTION PANEL
ALT: 46 U/L (ref 0–53)
AST: 42 U/L — ABNORMAL HIGH (ref 0–37)
Albumin: 3.7 g/dL (ref 3.5–5.2)
Alkaline Phosphatase: 81 U/L (ref 39–117)
Bilirubin, Direct: 0.2 mg/dL (ref 0.0–0.3)
Indirect Bilirubin: 0.5 mg/dL (ref 0.0–0.9)
Total Bilirubin: 0.7 mg/dL (ref 0.3–1.2)
Total Protein: 6.4 g/dL (ref 6.0–8.3)

## 2013-06-30 ENCOUNTER — Other Ambulatory Visit: Payer: Self-pay | Admitting: Cardiovascular Disease

## 2013-06-30 NOTE — Telephone Encounter (Signed)
Rx was sent to pharmacy electronically. 

## 2013-09-29 ENCOUNTER — Ambulatory Visit (INDEPENDENT_AMBULATORY_CARE_PROVIDER_SITE_OTHER): Payer: Medicare Other | Admitting: Cardiovascular Disease

## 2013-09-29 ENCOUNTER — Encounter: Payer: Self-pay | Admitting: Cardiovascular Disease

## 2013-09-29 VITALS — BP 136/77 | HR 48 | Ht 67.0 in | Wt 126.4 lb

## 2013-09-29 DIAGNOSIS — I719 Aortic aneurysm of unspecified site, without rupture: Secondary | ICD-10-CM | POA: Diagnosis not present

## 2013-09-29 DIAGNOSIS — I729 Aneurysm of unspecified site: Secondary | ICD-10-CM | POA: Diagnosis not present

## 2013-09-29 DIAGNOSIS — S78119A Complete traumatic amputation at level between unspecified hip and knee, initial encounter: Secondary | ICD-10-CM

## 2013-09-29 DIAGNOSIS — E785 Hyperlipidemia, unspecified: Secondary | ICD-10-CM

## 2013-09-29 DIAGNOSIS — I10 Essential (primary) hypertension: Secondary | ICD-10-CM | POA: Diagnosis not present

## 2013-09-29 DIAGNOSIS — Z89619 Acquired absence of unspecified leg above knee: Secondary | ICD-10-CM

## 2013-09-29 DIAGNOSIS — K219 Gastro-esophageal reflux disease without esophagitis: Secondary | ICD-10-CM
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
## 2013-09-29 NOTE — Patient Instructions (Signed)
Your physician recommends that you schedule a follow-up appointment in: 6 Months  Your physician has requested that you have an abdominal aorta duplex. During this test, an ultrasound is used to evaluate the aorta. Allow 30 minutes for this exam. Do not eat after midnight the day before and avoid carbonated beverages

## 2013-10-11 ENCOUNTER — Ambulatory Visit (HOSPITAL_COMMUNITY)
Admission: RE | Admit: 2013-10-11 | Discharge: 2013-10-11 | Disposition: A | Discharge status: Home / Self Care | Payer: Medicare Other | Source: Ambulatory Visit | Attending: Cardiovascular Disease | Admitting: Cardiovascular Disease

## 2013-10-11 DIAGNOSIS — I714 Abdominal aortic aneurysm, without rupture, unspecified: Secondary | ICD-10-CM | POA: Insufficient documentation

## 2013-10-11 DIAGNOSIS — I719 Aortic aneurysm of unspecified site, without rupture: Secondary | ICD-10-CM

## 2013-10-11 DIAGNOSIS — I729 Aneurysm of unspecified site: Secondary | ICD-10-CM | POA: Diagnosis not present

## 2013-10-11 NOTE — Progress Notes (Signed)
Abdominal Aortic Duplex Completed °Brianna L Mazza,RVT °

## 2013-10-20 ENCOUNTER — Encounter: Payer: Self-pay | Admitting: *Deleted

## 2013-10-31 ENCOUNTER — Other Ambulatory Visit: Payer: Self-pay | Admitting: *Deleted

## 2013-10-31 MED ORDER — CARVEDILOL 12.5 MG PO TABS: 12.5000 mg | ORAL_TABLET | Freq: Two times a day (BID) | ORAL | Status: DC

## 2013-10-31 NOTE — Telephone Encounter (Signed)
Rx was sent to pharmacy electronically. 

## 2013-12-27 ENCOUNTER — Emergency Department (INDEPENDENT_AMBULATORY_CARE_PROVIDER_SITE_OTHER)
Admission: EM | Admit: 2013-12-27 | Discharge: 2013-12-27 | Disposition: A | Discharge status: Home / Self Care | Payer: Medicare Other | Source: Home / Self Care

## 2013-12-27 ENCOUNTER — Encounter (HOSPITAL_COMMUNITY): Payer: Self-pay | Admitting: Emergency Medicine

## 2013-12-27 DIAGNOSIS — M62838 Other muscle spasm: Secondary | ICD-10-CM | POA: Diagnosis not present

## 2013-12-27 DIAGNOSIS — M545 Low back pain, unspecified: Secondary | ICD-10-CM

## 2013-12-27 DIAGNOSIS — S161XXA Strain of muscle, fascia and tendon at neck level, initial encounter: Secondary | ICD-10-CM

## 2013-12-27 DIAGNOSIS — IMO0002 Reserved for concepts with insufficient information to code with codable children: Secondary | ICD-10-CM

## 2013-12-27 DIAGNOSIS — S339XXA Sprain of unspecified parts of lumbar spine and pelvis, initial encounter: Secondary | ICD-10-CM | POA: Diagnosis not present

## 2013-12-27 DIAGNOSIS — S139XXA Sprain of joints and ligaments of unspecified parts of neck, initial encounter: Secondary | ICD-10-CM

## 2013-12-27 DIAGNOSIS — S39012A Strain of muscle, fascia and tendon of lower back, initial encounter: Secondary | ICD-10-CM

## 2013-12-27 DIAGNOSIS — S335XXA Sprain of ligaments of lumbar spine, initial encounter: Secondary | ICD-10-CM

## 2013-12-27 DIAGNOSIS — S76312A Strain of muscle, fascia and tendon of the posterior muscle group at thigh level, left thigh, initial encounter: Secondary | ICD-10-CM

## 2013-12-27 MED ORDER — NAPROXEN 375 MG PO TABS: 375.0000 mg | ORAL_TABLET | Freq: Two times a day (BID) | ORAL | Status: DC

## 2013-12-27 MED ORDER — TRAMADOL HCL 50 MG PO TABS: 50.0000 mg | ORAL_TABLET | Freq: Four times a day (QID) | ORAL | Status: DC | PRN
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
## 2013-12-27 NOTE — ED Notes (Signed)
Reports neck and lower back pain, onset 7/21.  Patient is active and did fall playing golf recently.  Patient has a floating pier, requires awkward positioning to climb ladder, also kayaking recently

## 2013-12-27 NOTE — ED Provider Notes (Signed)
Medical screening examination/treatment/procedure(s) were performed by resident physician or non-physician practitioner and as supervising physician I was immediately available for consultation/collaboration.   Pauline Good MD.   Billy Fischer, MD 12/27/13 1700

## 2013-12-27 NOTE — Discharge Instructions (Signed)
Back Pain, Adult At this point, 1 week later use heat and disregard ice recommendations Low back pain is very common. About 1 in 5 people have back pain.The cause of low back pain is rarely dangerous. The pain often gets better over time.About half of people with a sudden onset of back pain feel better in just 2 weeks. About 8 in 10 people feel better by 6 weeks.  CAUSES Some common causes of back pain include:  Strain of the muscles or ligaments supporting the spine.  Wear and tear (degeneration) of the spinal discs.  Arthritis.  Direct injury to the back. DIAGNOSIS Most of the time, the direct cause of low back pain is not known.However, back pain can be treated effectively even when the exact cause of the pain is unknown.Answering your caregiver's questions about your overall health and symptoms is one of the most accurate ways to make sure the cause of your pain is not dangerous. If your caregiver needs more information, he or she may order lab work or imaging tests (X-rays or MRIs).However, even if imaging tests show changes in your back, this usually does not require surgery. HOME CARE INSTRUCTIONS For many people, back pain returns.Since low back pain is rarely dangerous, it is often a condition that people can learn to [facility] their own.   Remain active. It is stressful on the back to sit or stand in one place. Do not sit, drive, or stand in one place for more than 30 minutes at a time. Take short walks on level surfaces as soon as pain allows.Try to increase the length of time you walk each day.  Do not stay in bed.Resting more than 1 or 2 days can delay your recovery.  Do not avoid exercise or work.Your body is made to move.It is not dangerous to be active, even though your back may hurt.Your back will likely heal faster if you return to being active before your pain is gone.  Pay attention to your body when you bend and lift. Many people have less discomfortwhen  lifting if they bend their knees, keep the load close to their bodies,and avoid twisting. Often, the most comfortable positions are those that put less stress on your recovering back.  Find a comfortable position to sleep. Use a firm mattress and lie on your side with your knees slightly bent. If you lie on your back, put a pillow under your knees.  Only take over-the-counter or prescription medicines as directed by your caregiver. Over-the-counter medicines to reduce pain and inflammation are often the most helpful.Your caregiver may prescribe muscle relaxant drugs.These medicines help dull your pain so you can more quickly return to your normal activities and healthy exercise.  Put ice on the injured area.  Put ice in a plastic bag.  Place a towel between your skin and the bag.  Leave the ice on for 15-20 minutes, 03-04 times a day for the first 2 to 3 days. After that, ice and heat may be alternated to reduce pain and spasms.  Ask your caregiver about trying back exercises and gentle massage. This may be of some benefit.  Avoid feeling anxious or stressed.Stress increases muscle tension and can worsen back pain.It is important to recognize when you are anxious or stressed and learn ways to manage it.Exercise is a great option. SEEK MEDICAL CARE IF:  You have pain that is not relieved with rest or medicine.  You have pain that does not improve in 1 week.  You have new symptoms.  You are generally not feeling well. SEEK IMMEDIATE MEDICAL CARE IF:   You have pain that radiates from your back into your legs.  You develop new bowel or bladder control problems.  You have unusual weakness or numbness in your arms or legs.  You develop nausea or vomiting.  You develop abdominal pain.  You feel faint. Document Released: 05/19/2005 Document Revised: 11/18/2011 Document Reviewed: 09/20/2013 [facility] Patient Information 2015 [place], [place]. This information is not intended to  replace advice given to you by your health care provider. Make sure you discuss any questions you have with your health care provider.  Cervical Sprain A cervical sprain is an injury in the neck in which the strong, fibrous tissues (ligaments) that connect your neck bones stretch or tear. Cervical sprains can range from mild to severe. Severe cervical sprains can cause the neck vertebrae to be unstable. This can lead to damage of the spinal cord and can result in serious nervous system problems. The amount of time it takes for a cervical sprain to get better depends on the cause and extent of the injury. Most cervical sprains heal in 1 to 3 weeks. CAUSES  Severe cervical sprains may be caused by:   Contact sport injuries (such as from football, rugby, wrestling, hockey, auto racing, gymnastics, diving, martial arts, or boxing).   Motor vehicle collisions.   Whiplash injuries. This is an injury from a sudden forward and backward whipping movement of the head and neck.  Falls.  Mild cervical sprains may be caused by:   Being in an awkward position, such as while cradling a telephone between your ear and shoulder.   Sitting in a chair that does not offer proper support.   Working at a poorly [employer] station.   Looking up or down for long periods of time.  SYMPTOMS   Pain, soreness, stiffness, or a burning sensation in the front, back, or sides of the neck. This discomfort may develop immediately after the injury or slowly, 24 hours or more after the injury.   Pain or tenderness directly in the middle of the back of the neck.   Shoulder or upper back pain.   Limited ability to move the neck.   Headache.   Dizziness.   Weakness, numbness, or tingling in the hands or arms.   Muscle spasms.   Difficulty swallowing or chewing.   Tenderness and swelling of the neck.  DIAGNOSIS  Most of the time your health care provider can diagnose a cervical sprain by  taking your history and doing a physical exam. Your health care provider will ask about previous neck injuries and any known neck problems, such as arthritis in the neck. X-rays may be taken to find out if there are any other problems, such as with the bones of the neck. Other tests, such as a CT scan or MRI, may also be needed.  TREATMENT  Treatment depends on the severity of the cervical sprain. Mild sprains can be treated with rest, keeping the neck in place (immobilization), and pain medicines. Severe cervical sprains are immediately immobilized. Further treatment is done to help with pain, muscle spasms, and other symptoms and may include:  Medicines, such as pain relievers, numbing medicines, or muscle relaxants.   Physical therapy. This may involve stretching exercises, strengthening exercises, and posture training. Exercises and improved posture can help stabilize the neck, strengthen muscles, and help stop symptoms from returning.  HOME CARE INSTRUCTIONS  Put ice on the injured area.   Put ice in a plastic bag.   Place a towel between your skin and the bag.   Leave the ice on for 15-20 minutes, 3-4 times a day.   If your injury was severe, you may have been given a cervical collar to wear. A cervical collar is a two-piece collar designed to keep your neck from moving while it heals.  Do not remove the collar unless instructed by your health care provider.  If you have long hair, keep it outside of the collar.  Ask your health care provider before making any adjustments to your collar. Minor adjustments may be required over time to improve comfort and reduce pressure on your chin or on the back of your head.  Ifyou are allowed to remove the collar for cleaning or bathing, follow your health care provider's instructions on how to do so safely.  Keep your collar clean by wiping it with mild soap and water and drying it completely. If the collar you have been given includes  removable pads, remove them every 1-2 days and hand wash them with soap and water. Allow them to air dry. They should be completely dry before you wear them in the collar.  If you are allowed to remove the collar for cleaning and bathing, wash and dry the skin of your neck. Check your skin for irritation or sores. If you see any, tell your health care provider.  Do not drive while wearing the collar.   Only take over-the-counter or prescription medicines for pain, discomfort, or fever as directed by your health care provider.   Keep all follow-up appointments as directed by your health care provider.   Keep all physical therapy appointments as directed by your health care provider.   Make any needed adjustments to your workstation to promote good posture.   Avoid positions and activities that make your symptoms worse.   Warm up and stretch before being active to help prevent problems.  SEEK MEDICAL CARE IF:   Your pain is not controlled with medicine.   You are unable to decrease your pain medicine over time as planned.   Your activity level is not improving as expected.  SEEK IMMEDIATE MEDICAL CARE IF:   You develop any bleeding.  You develop stomach upset.  You have signs of an allergic reaction to your medicine.   Your symptoms get worse.   You develop new, unexplained symptoms.   You have numbness, tingling, weakness, or paralysis in any part of your body.  MAKE SURE YOU:   Understand these instructions.  Will watch your condition.  Will get help right away if you are not doing well or get worse. Document Released: 03/16/2007 Document Revised: 05/24/2013 Document Reviewed: 11/24/2012 [facility] Patient Information 2015 [place], [place]. This information is not intended to replace advice given to you by your health care provider. Make sure you discuss any questions you have with your health care provider.  Cervical Strain and Sprain (Whiplash) with  Rehab Cervical strain and sprain are injuries that commonly occur with "whiplash" injuries. Whiplash occurs when the neck is forcefully whipped backward or forward, such as during a motor vehicle accident or during contact sports. The muscles, ligaments, tendons, discs, and nerves of the neck are susceptible to injury when this occurs. RISK FACTORS Risk of having a whiplash injury increases if:  Osteoarthritis of the spine.  Situations that make head or neck accidents or trauma more likely.  High-risk  sports (football, rugby, wrestling, hockey, auto racing, gymnastics, diving, contact karate, or boxing).  Poor strength and flexibility of the neck.  Previous neck injury.  Poor tackling technique.  Improperly fitted or padded equipment. SYMPTOMS   Pain or stiffness in the front or back of neck or both.  Symptoms may present immediately or up to 24 hours after injury.  Dizziness, headache, nausea, and vomiting.  Muscle spasm with soreness and stiffness in the neck.  Tenderness and swelling at the injury site. PREVENTION  Learn and use proper technique (avoid tackling with the head, spearing, and head-butting; use proper falling techniques to avoid landing on the head).  Warm up and stretch properly before activity.  Maintain physical fitness:  Strength, flexibility, and endurance.  Cardiovascular fitness.  Wear properly fitted and padded protective equipment, such as padded soft collars, for participation in contact sports. PROGNOSIS  Recovery from cervical strain and sprain injuries is dependent on the extent of the injury. These injuries are usually curable in 1 week to 3 months with appropriate treatment.  RELATED COMPLICATIONS   Temporary numbness and weakness may occur if the nerve roots are damaged, and this may persist until the nerve has completely healed.  Chronic pain due to frequent recurrence of symptoms.  Prolonged healing, especially if activity is resumed  too soon (before complete recovery). TREATMENT  Treatment initially involves the use of ice and medication to help reduce pain and inflammation. It is also important to perform strengthening and stretching exercises and modify activities that worsen symptoms so the injury does not get worse. These exercises may be performed at home or with a therapist. For patients who experience severe symptoms, a soft, padded collar may be recommended to be worn around the neck.  Improving your posture may help reduce symptoms. Posture improvement includes pulling your chin and abdomen in while sitting or standing. If you are sitting, sit in a firm chair with your buttocks against the back of the chair. While sleeping, try replacing your pillow with a small towel rolled to 2 inches in diameter, or use a cervical pillow or soft cervical collar. Poor sleeping positions delay healing.  For patients with nerve root damage, which causes numbness or weakness, the use of a cervical traction apparatus may be recommended. Surgery is rarely necessary for these injuries. However, cervical strain and sprains that are present at birth (congenital) may require surgery. MEDICATION   If pain medication is necessary, nonsteroidal anti-inflammatory medications, such as aspirin and ibuprofen, or other minor pain relievers, such as acetaminophen, are often recommended.  Do not take pain medication for 7 days before surgery.  Prescription pain relievers may be given if deemed necessary by your caregiver. Use only as directed and only as much as you need. HEAT AND COLD:   Cold treatment (icing) relieves pain and reduces inflammation. Cold treatment should be applied for 10 to 15 minutes every 2 to 3 hours for inflammation and pain and immediately after any activity that aggravates your symptoms. Use ice packs or an ice massage.  Heat treatment may be used prior to performing the stretching and strengthening activities prescribed by your  caregiver, physical therapist, or athletic trainer. Use a heat pack or a warm soak. SEEK MEDICAL CARE IF:   Symptoms get worse or do not improve in 2 weeks despite treatment.  New, unexplained symptoms develop (drugs used in treatment may produce side effects). EXERCISES RANGE OF MOTION (ROM) AND STRETCHING EXERCISES - Cervical Strain and Sprain These exercises  may help you when beginning to rehabilitate your injury. In order to successfully resolve your symptoms, you must improve your posture. These exercises are designed to help reduce the forward-head and rounded-shoulder posture which contributes to this condition. Your symptoms may resolve with or without further involvement from your physician, physical therapist or athletic trainer. While completing these exercises, remember:   Restoring tissue flexibility helps normal motion to return to the joints. This allows healthier, less painful movement and activity.  An effective stretch should be held for at least 20 seconds, although you may need to begin with shorter hold times for comfort.  A stretch should never be painful. You should only feel a gentle lengthening or release in the stretched tissue. STRETCH- Axial Extensors  Lie on your back on the floor. You may bend your knees for comfort. Place a rolled-up hand towel or dish towel, about 2 inches in diameter, under the part of your head that makes contact with the floor.  Gently tuck your chin, as if trying to make a "double chin," until you feel a gentle stretch at the base of your head.  Hold __________ seconds. Repeat __________ times. Complete this exercise __________ times per day.  STRETCH - Axial Extension   Stand or sit on a firm surface. Assume a good posture: chest up, shoulders drawn back, abdominal muscles slightly tense, knees unlocked (if standing) and feet hip width apart.  Slowly retract your chin so your head slides back and your chin slightly lowers. Continue to  look straight ahead.  You should feel a gentle stretch in the back of your head. Be certain not to feel an aggressive stretch since this can cause headaches later.  Hold for __________ seconds. Repeat __________ times. Complete this exercise __________ times per day. STRETCH - Cervical Side Bend   Stand or sit on a firm surface. Assume a good posture: chest up, shoulders drawn back, abdominal muscles slightly tense, knees unlocked (if standing) and feet hip width apart.  Without letting your nose or shoulders move, slowly tip your right / left ear to your shoulder until your feel a gentle stretch in the muscles on the opposite side of your neck.  Hold __________ seconds. Repeat __________ times. Complete this exercise __________ times per day. STRETCH - Cervical Rotators   Stand or sit on a firm surface. Assume a good posture: chest up, shoulders drawn back, abdominal muscles slightly tense, knees unlocked (if standing) and feet hip width apart.  Keeping your eyes level with the ground, slowly turn your head until you feel a gentle stretch along the back and opposite side of your neck.  Hold __________ seconds. Repeat __________ times. Complete this exercise __________ times per day. RANGE OF MOTION - Neck Circles   Stand or sit on a firm surface. Assume a good posture: chest up, shoulders drawn back, abdominal muscles slightly tense, knees unlocked (if standing) and feet hip width apart.  Gently roll your head down and around from the back of one shoulder to the back of the other. The motion should never be forced or painful.  Repeat the motion 10-20 times, or until you feel the neck muscles relax and loosen. Repeat __________ times. Complete the exercise __________ times per day. STRENGTHENING EXERCISES - Cervical Strain and Sprain These exercises may help you when beginning to rehabilitate your injury. They may resolve your symptoms with or without further involvement from your  physician, physical therapist, or athletic trainer. While completing these exercises, remember:  Muscles can gain both the endurance and the strength needed for everyday activities through controlled exercises.  Complete these exercises as instructed by your physician, physical therapist, or athletic trainer. Progress the resistance and repetitions only as guided.  You may experience muscle soreness or fatigue, but the pain or discomfort you are trying to eliminate should never worsen during these exercises. If this pain does worsen, stop and make certain you are following the directions exactly. If the pain is still present after adjustments, discontinue the exercise until you can discuss the trouble with your clinician. STRENGTH - Cervical Flexors, Isometric  Face a wall, standing about 6 inches away. Place a small pillow, a ball about 6-8 inches in diameter, or a folded towel between your forehead and the wall.  Slightly tuck your chin and gently push your forehead into the soft object. Push only with mild to moderate intensity, building up tension gradually. Keep your jaw and forehead relaxed.  Hold 10 to 20 seconds. Keep your breathing relaxed.  Release the tension slowly. Relax your neck muscles completely before you start the next repetition. Repeat __________ times. Complete this exercise __________ times per day. STRENGTH- Cervical Lateral Flexors, Isometric   Stand about 6 inches away from a wall. Place a small pillow, a ball about 6-8 inches in diameter, or a folded towel between the side of your head and the wall.  Slightly tuck your chin and gently tilt your head into the soft object. Push only with mild to moderate intensity, building up tension gradually. Keep your jaw and forehead relaxed.  Hold 10 to 20 seconds. Keep your breathing relaxed.  Release the tension slowly. Relax your neck muscles completely before you start the next repetition. Repeat __________ times.  Complete this exercise __________ times per day. STRENGTH - Cervical Extensors, Isometric   Stand about 6 inches away from a wall. Place a small pillow, a ball about 6-8 inches in diameter, or a folded towel between the back of your head and the wall.  Slightly tuck your chin and gently tilt your head back into the soft object. Push only with mild to moderate intensity, building up tension gradually. Keep your jaw and forehead relaxed.  Hold 10 to 20 seconds. Keep your breathing relaxed.  Release the tension slowly. Relax your neck muscles completely before you start the next repetition. Repeat __________ times. Complete this exercise __________ times per day. POSTURE AND BODY MECHANICS CONSIDERATIONS - Cervical Strain and Sprain Keeping correct posture when sitting, standing or completing your activities will reduce the stress put on different body tissues, allowing injured tissues a chance to heal and limiting painful experiences. The following are general guidelines for improved posture. Your physician or physical therapist will provide you with any instructions specific to your needs. While reading these guidelines, remember:  The exercises prescribed by your provider will help you have the flexibility and strength to maintain correct postures.  The correct posture provides the optimal environment for your joints to work. All of your joints have less wear and tear when properly supported by a spine with good posture. This means you will experience a healthier, less painful body.  Correct posture must be practiced with all of your activities, especially prolonged sitting and standing. Correct posture is as important when doing repetitive low-stress activities (typing) as it is when doing a single heavy-load activity (lifting). PROLONGED STANDING WHILE SLIGHTLY LEANING FORWARD When completing a task that requires you to lean forward while standing in one place for  a long time, place either foot  up on a stationary 2- to 4-inch high object to help maintain the best posture. When both feet are on the ground, the low back tends to lose its slight inward curve. If this curve flattens (or becomes too large), then the back and your other joints will experience too much stress, fatigue more quickly, and can cause pain.  RESTING POSITIONS Consider which positions are most painful for you when choosing a resting position. If you have pain with flexion-based activities (sitting, bending, stooping, squatting), choose a position that allows you to rest in a less flexed posture. You would want to avoid curling into a fetal position on your side. If your pain worsens with extension-based activities (prolonged standing, working overhead), avoid resting in an extended position such as sleeping on your stomach. Most people will find more comfort when they rest with their spine in a more neutral position, neither too rounded nor too arched. Lying on a non-sagging bed on your side with a pillow between your knees, or on your back with a pillow under your knees will often provide some relief. Keep in mind, being in any one position for a prolonged period of time, no matter how correct your posture, can still lead to stiffness. WALKING Walk with an upright posture. Your ears, shoulders, and hips should all line up. OFFICE WORK When working at a desk, create an environment that supports good, upright posture. Without extra support, muscles fatigue and lead to excessive strain on joints and other tissues. CHAIR:  A chair should be able to slide under your desk when your back makes contact with the back of the chair. This allows you to work closely.  The chair's height should allow your eyes to be level with the upper part of your monitor and your hands to be slightly lower than your elbows.  Body position:  Your feet should make contact with the floor. If this is not possible, use a foot rest.  Keep your ears  over your shoulders. This will reduce stress on your neck and low back. Document Released: 05/19/2005 Document Revised: 10/03/2013 Document Reviewed: 08/31/2008 [facility] Patient Information 2015 [place], [place]. This information is not intended to replace advice given to you by your health care provider. Make sure you discuss any questions you have with your health care provider.  Hamstring Strain  Hamstrings are the large muscles in the back of the thighs. A strain or tear injury happens when there is a sudden stretch or pull on these muscles and tendons. Tendons are cord like structures that attach muscle to bone. These injuries are commonly seen in activities such as sprinting due to sudden acceleration.  DIAGNOSIS  Often the diagnosis can be made by examination. HOME CARE INSTRUCTIONS   Apply ice to the sore area for 15-26minutes, 03-04 times per day. Do this while awake for the first 2 days. Put the ice in a plastic bag, and place a towel between the bag of ice and your skin.  Keep your knee flexed when possible. This means your foot is held off the ground slightly if you are on crutches. When lying down, a pillow under the knee will take strain off the muscles and provide some relief.  If a compression bandage such as an ace wrap was applied, use it until you are seen again. You may remove it for sleeping, showers and baths. If the wrap seems to be too tight and is uncomfortable, wrap it more  loosely. If your toes or foot are getting cold or blue, it is too tight.  Walk or move around as the pain allows, or as instructed. Resume full activities as suggested by your caregiver. This is often safest when the strength of the injured leg has nearly returned to normal.  Only take over-the-counter or prescription medicines for pain, discomfort, or fever as directed by your caregiver. SEEK MEDICAL CARE IF:   You have an increase in bruising, swelling or pain.  You notice coldness or blueness of  your toes or foot.  Pain relief is not obtained with medications.  You have increasing pain in the area and seem to be getting worse rather than better.  You notice your thigh getting larger in size (this could indicate bleeding into the muscle). Document Released: 02/11/2001 Document Revised: 08/11/2011 Document Reviewed: 05/21/2008 [facility] Patient Information 2015 [place], [place]. This information is not intended to replace advice given to you by your health care provider. Make sure you discuss any questions you have with your health care provider.  Lumbosacral Strain Lumbosacral strain is a strain of any of the parts that make up your lumbosacral vertebrae. Your lumbosacral vertebrae are the bones that make up the lower third of your backbone. Your lumbosacral vertebrae are held together by muscles and tough, fibrous tissue (ligaments).  CAUSES  A sudden blow to your back can cause lumbosacral strain. Also, anything that causes an excessive stretch of the muscles in the low back can cause this strain. This is typically seen when people exert themselves strenuously, fall, lift heavy objects, bend, or crouch repeatedly. RISK FACTORS  Physically demanding work.  Participation in pushing or pulling sports or sports that require a sudden twist of the back (tennis, golf, baseball).  Weight lifting.  Excessive lower back curvature.  Forward-tilted pelvis.  Weak back or abdominal muscles or both.  Tight hamstrings. SIGNS AND SYMPTOMS  Lumbosacral strain may cause pain in the area of your injury or pain that moves (radiates) down your leg.  DIAGNOSIS Your health care provider can often diagnose lumbosacral strain through a physical exam. In some cases, you may need tests such as X-ray exams.  TREATMENT  Treatment for your lower back injury depends on many factors that your clinician will have to evaluate. However, most treatment will include the use of anti-inflammatory medicines. HOME  CARE INSTRUCTIONS   Avoid hard physical activities (tennis, racquetball, waterskiing) if you are not in proper physical condition for it. This may aggravate or create problems.  If you have a back problem, avoid sports requiring sudden body movements. Swimming and walking are generally safer activities.  Maintain good posture.  Maintain a healthy weight.  For acute conditions, you may put ice on the injured area.  Put ice in a plastic bag.  Place a towel between your skin and the bag.  Leave the ice on for 20 minutes, 2-3 times a day.  When the low back starts healing, stretching and strengthening exercises may be recommended. SEEK MEDICAL CARE IF:  Your back pain is getting worse.  You experience severe back pain not relieved with medicines. SEEK IMMEDIATE MEDICAL CARE IF:   You have numbness, tingling, weakness, or problems with the use of your arms or legs.  There is a change in bowel or bladder control.  You have increasing pain in any area of the body, including your belly (abdomen).  You notice shortness of breath, dizziness, or feel faint.  You feel sick to your stomach (  nauseous), are throwing up (vomiting), or become sweaty.  You notice discoloration of your toes or legs, or your feet get very cold. MAKE SURE YOU:   Understand these instructions.  Will watch your condition.  Will get help right away if you are not doing well or get worse. Document Released: 02/26/2005 Document Revised: 05/24/2013 Document Reviewed: 01/05/2013 [facility] Patient Information 2015 [place], [place]. This information is not intended to replace advice given to you by your health care provider. Make sure you discuss any questions you have with your health care provider.

## 2013-12-31 DIAGNOSIS — S065XAA Traumatic subdural hemorrhage with loss of consciousness status unknown, initial encounter: Secondary | ICD-10-CM

## 2013-12-31 DIAGNOSIS — S065X9A Traumatic subdural hemorrhage with loss of consciousness of unspecified duration, initial encounter: Secondary | ICD-10-CM

## 2013-12-31 HISTORY — DX: Traumatic subdural hemorrhage with loss of consciousness of unspecified duration, initial encounter: S06.5X9A

## 2013-12-31 HISTORY — DX: Traumatic subdural hemorrhage with loss of consciousness status unknown, initial encounter: S06.5XAA

## 2014-01-03 ENCOUNTER — Emergency Department (HOSPITAL_COMMUNITY): Payer: Medicare Other

## 2014-01-03 ENCOUNTER — Emergency Department (INDEPENDENT_AMBULATORY_CARE_PROVIDER_SITE_OTHER)
Admission: EM | Admit: 2014-01-03 | Discharge: 2014-01-03 | Disposition: A | Discharge status: Nursing Home (Includes ICF) | Payer: Medicare Other | Source: Home / Self Care | Attending: Emergency Medicine | Admitting: Emergency Medicine

## 2014-01-03 ENCOUNTER — Encounter (HOSPITAL_COMMUNITY): Payer: Self-pay | Admitting: Emergency Medicine

## 2014-01-03 ENCOUNTER — Inpatient Hospital Stay (HOSPITAL_COMMUNITY)
Admission: EM | Admit: 2014-01-03 | Discharge: 2014-01-06 | DRG: 377 | Disposition: A | Discharge status: Home / Self Care | Payer: Medicare Other | Attending: Internal Medicine | Admitting: Internal Medicine

## 2014-01-03 DIAGNOSIS — S065XAA Traumatic subdural hemorrhage with loss of consciousness status unknown, initial encounter: Secondary | ICD-10-CM

## 2014-01-03 DIAGNOSIS — R4789 Other speech disturbances: Secondary | ICD-10-CM | POA: Diagnosis present

## 2014-01-03 DIAGNOSIS — Z8673 Personal history of transient ischemic attack (TIA), and cerebral infarction without residual deficits: Secondary | ICD-10-CM

## 2014-01-03 DIAGNOSIS — G8929 Other chronic pain: Secondary | ICD-10-CM | POA: Diagnosis present

## 2014-01-03 DIAGNOSIS — M545 Low back pain, unspecified: Secondary | ICD-10-CM

## 2014-01-03 DIAGNOSIS — R471 Dysarthria and anarthria: Secondary | ICD-10-CM

## 2014-01-03 DIAGNOSIS — Z9861 Coronary angioplasty status: Secondary | ICD-10-CM

## 2014-01-03 DIAGNOSIS — R29818 Other symptoms and signs involving the nervous system: Secondary | ICD-10-CM | POA: Diagnosis not present

## 2014-01-03 DIAGNOSIS — K254 Chronic or unspecified gastric ulcer with hemorrhage: Secondary | ICD-10-CM | POA: Diagnosis present

## 2014-01-03 DIAGNOSIS — D649 Anemia, unspecified: Secondary | ICD-10-CM

## 2014-01-03 DIAGNOSIS — I10 Essential (primary) hypertension: Secondary | ICD-10-CM | POA: Diagnosis present

## 2014-01-03 DIAGNOSIS — I6200 Nontraumatic subdural hemorrhage, unspecified: Secondary | ICD-10-CM | POA: Diagnosis not present

## 2014-01-03 DIAGNOSIS — K922 Gastrointestinal hemorrhage, unspecified: Secondary | ICD-10-CM

## 2014-01-03 DIAGNOSIS — W19XXXA Unspecified fall, initial encounter: Secondary | ICD-10-CM | POA: Diagnosis not present

## 2014-01-03 DIAGNOSIS — Z720 Tobacco use: Secondary | ICD-10-CM

## 2014-01-03 DIAGNOSIS — D500 Iron deficiency anemia secondary to blood loss (chronic): Secondary | ICD-10-CM | POA: Diagnosis not present

## 2014-01-03 DIAGNOSIS — I2589 Other forms of chronic ischemic heart disease: Secondary | ICD-10-CM

## 2014-01-03 DIAGNOSIS — S78119A Complete traumatic amputation at level between unspecified hip and knee, initial encounter: Secondary | ICD-10-CM

## 2014-01-03 DIAGNOSIS — E785 Hyperlipidemia, unspecified: Secondary | ICD-10-CM | POA: Diagnosis not present

## 2014-01-03 DIAGNOSIS — I6350 Cerebral infarction due to unspecified occlusion or stenosis of unspecified cerebral artery: Secondary | ICD-10-CM | POA: Diagnosis not present

## 2014-01-03 DIAGNOSIS — IMO0002 Reserved for concepts with insufficient information to code with codable children: Secondary | ICD-10-CM | POA: Diagnosis not present

## 2014-01-03 DIAGNOSIS — D62 Acute posthemorrhagic anemia: Secondary | ICD-10-CM | POA: Diagnosis present

## 2014-01-03 DIAGNOSIS — I2510 Atherosclerotic heart disease of native coronary artery without angina pectoris: Secondary | ICD-10-CM | POA: Diagnosis present

## 2014-01-03 DIAGNOSIS — K921 Melena: Secondary | ICD-10-CM | POA: Diagnosis not present

## 2014-01-03 DIAGNOSIS — F0781 Postconcussional syndrome: Secondary | ICD-10-CM | POA: Diagnosis present

## 2014-01-03 DIAGNOSIS — R7989 Other specified abnormal findings of blood chemistry: Secondary | ICD-10-CM

## 2014-01-03 DIAGNOSIS — R55 Syncope and collapse: Secondary | ICD-10-CM | POA: Diagnosis not present

## 2014-01-03 DIAGNOSIS — Z87891 Personal history of nicotine dependence: Secondary | ICD-10-CM | POA: Diagnosis not present

## 2014-01-03 DIAGNOSIS — K219 Gastro-esophageal reflux disease without esophagitis: Secondary | ICD-10-CM | POA: Diagnosis present

## 2014-01-03 DIAGNOSIS — Z79899 Other long term (current) drug therapy: Secondary | ICD-10-CM | POA: Diagnosis not present

## 2014-01-03 DIAGNOSIS — S065X9A Traumatic subdural hemorrhage with loss of consciousness of unspecified duration, initial encounter: Secondary | ICD-10-CM

## 2014-01-03 DIAGNOSIS — K259 Gastric ulcer, unspecified as acute or chronic, without hemorrhage or perforation: Secondary | ICD-10-CM | POA: Diagnosis not present

## 2014-01-03 DIAGNOSIS — I951 Orthostatic hypotension: Secondary | ICD-10-CM | POA: Diagnosis not present

## 2014-01-03 DIAGNOSIS — R269 Unspecified abnormalities of gait and mobility: Secondary | ICD-10-CM | POA: Diagnosis present

## 2014-01-03 DIAGNOSIS — I252 Old myocardial infarction: Secondary | ICD-10-CM | POA: Diagnosis not present

## 2014-01-03 DIAGNOSIS — I959 Hypotension, unspecified: Secondary | ICD-10-CM

## 2014-01-03 DIAGNOSIS — Z955 Presence of coronary angioplasty implant and graft: Secondary | ICD-10-CM

## 2014-01-03 DIAGNOSIS — I255 Ischemic cardiomyopathy: Secondary | ICD-10-CM

## 2014-01-03 DIAGNOSIS — I719 Aortic aneurysm of unspecified site, without rupture: Secondary | ICD-10-CM

## 2014-01-03 DIAGNOSIS — Z7982 Long term (current) use of aspirin: Secondary | ICD-10-CM | POA: Diagnosis not present

## 2014-01-03 DIAGNOSIS — I9589 Other hypotension: Secondary | ICD-10-CM | POA: Diagnosis not present

## 2014-01-03 DIAGNOSIS — R2689 Other abnormalities of gait and mobility: Secondary | ICD-10-CM

## 2014-01-03 DIAGNOSIS — R945 Abnormal results of liver function studies: Secondary | ICD-10-CM

## 2014-01-03 DIAGNOSIS — G9341 Metabolic encephalopathy: Secondary | ICD-10-CM | POA: Diagnosis present

## 2014-01-03 DIAGNOSIS — I724 Aneurysm of artery of lower extremity: Secondary | ICD-10-CM

## 2014-01-03 DIAGNOSIS — R2681 Unsteadiness on feet: Secondary | ICD-10-CM

## 2014-01-03 DIAGNOSIS — W1789XA Other fall from one level to another, initial encounter: Secondary | ICD-10-CM | POA: Diagnosis present

## 2014-01-03 HISTORY — DX: Traumatic subdural hemorrhage with loss of consciousness of unspecified duration, initial encounter: S06.5X9A

## 2014-01-03 HISTORY — DX: Traumatic subdural hemorrhage with loss of consciousness status unknown, initial encounter: S06.5XAA

## 2014-01-03 LAB — CBC
HCT: 18.5 % — ABNORMAL LOW (ref 39.0–52.0)
HEMOGLOBIN: 6.5 g/dL — AB (ref 13.0–17.0)
MCH: 35.5 pg — AB (ref 26.0–34.0)
MCHC: 35.1 g/dL (ref 30.0–36.0)
MCV: 101.1 fL — AB (ref 78.0–100.0)
Platelets: 151 10*3/uL (ref 150–400)
RBC: 1.83 MIL/uL — AB (ref 4.22–5.81)
RDW: 14.7 % (ref 11.5–15.5)
WBC: 5.4 10*3/uL (ref 4.0–10.5)

## 2014-01-03 LAB — COMPREHENSIVE METABOLIC PANEL
ALK PHOS: 56 U/L (ref 39–117)
ALT: 29 U/L (ref 0–53)
AST: 35 U/L (ref 0–37)
Albumin: 2.7 g/dL — ABNORMAL LOW (ref 3.5–5.2)
Anion gap: 12 (ref 5–15)
BUN: 44 mg/dL — ABNORMAL HIGH (ref 6–23)
CALCIUM: 8.4 mg/dL (ref 8.4–10.5)
CO2: 20 meq/L (ref 19–32)
Chloride: 107 mEq/L (ref 96–112)
Creatinine, Ser: 0.89 mg/dL (ref 0.50–1.35)
GFR, EST NON AFRICAN AMERICAN: 83 mL/min — AB (ref >90)
GLUCOSE: 119 mg/dL — AB (ref 70–99)
Potassium: 4.2 mEq/L (ref 3.7–5.3)
SODIUM: 139 meq/L (ref 137–147)
Total Bilirubin: 0.6 mg/dL (ref 0.3–1.2)
Total Protein: 5.2 g/dL — ABNORMAL LOW (ref 6.0–8.3)

## 2014-01-03 LAB — PREPARE RBC (CROSSMATCH)

## 2014-01-03 LAB — MRSA PCR SCREENING: MRSA by PCR: NEGATIVE

## 2014-01-03 LAB — ABO/RH: ABO/RH(D): A POS

## 2014-01-03 LAB — POC OCCULT BLOOD, ED: Fecal Occult Bld: POSITIVE — AB

## 2014-01-03 MED ORDER — SODIUM CHLORIDE 0.9 % IV SOLN
80.0000 mg | Freq: Once | INTRAVENOUS | Status: AC
  Administered 2014-01-03: 80 mg via INTRAVENOUS
  Filled 2014-01-03 (×3): qty 80

## 2014-01-03 MED ORDER — SODIUM CHLORIDE 0.9 % IV SOLN
INTRAVENOUS | Status: AC
  Administered 2014-01-03: 125 mL via INTRAVENOUS

## 2014-01-03 MED ORDER — PANTOPRAZOLE SODIUM 40 MG IV SOLR
40.0000 mg | Freq: Two times a day (BID) | INTRAVENOUS | Status: DC
  Administered 2014-01-04 – 2014-01-05 (×4): 40 mg via INTRAVENOUS
  Filled 2014-01-03 (×8): qty 40

## 2014-01-03 MED ORDER — ADULT MULTIVITAMIN W/MINERALS CH
1.0000 | ORAL_TABLET | Freq: Every day | ORAL | Status: DC
  Administered 2014-01-04 – 2014-01-06 (×3): 1 via ORAL
  Filled 2014-01-03 (×3): qty 1

## 2014-01-03 MED ORDER — ZOLPIDEM TARTRATE 5 MG PO TABS
5.0000 mg | ORAL_TABLET | Freq: Every evening | ORAL | Status: DC | PRN
  Administered 2014-01-03: 5 mg via ORAL
  Filled 2014-01-03: qty 1

## 2014-01-03 MED ORDER — SODIUM CHLORIDE 0.9 % IV SOLN
INTRAVENOUS | Status: DC
  Administered 2014-01-03: 10 mL/h via INTRAVENOUS

## 2014-01-03 MED ORDER — ACETAMINOPHEN 650 MG RE SUPP: 650.0000 mg | Freq: Four times a day (QID) | RECTAL | Status: DC | PRN

## 2014-01-03 MED ORDER — ATORVASTATIN CALCIUM 10 MG PO TABS
10.0000 mg | ORAL_TABLET | Freq: Every day | ORAL | Status: DC
  Administered 2014-01-03 – 2014-01-04 (×2): 10 mg via ORAL
  Filled 2014-01-03 (×3): qty 1

## 2014-01-03 MED ORDER — SODIUM CHLORIDE 0.9 % IV BOLUS (SEPSIS)
1000.0000 mL | Freq: Once | INTRAVENOUS | Status: AC
Start: 2014-01-03 — End: 2014-01-03
  Administered 2014-01-03: 1000 mL via INTRAVENOUS

## 2014-01-03 MED ORDER — SODIUM CHLORIDE 0.9 % IV SOLN
Freq: Once | INTRAVENOUS | Status: AC
  Administered 2014-01-03: 19:00:00 via INTRAVENOUS

## 2014-01-03 MED ORDER — HYDROCODONE-ACETAMINOPHEN 5-325 MG PO TABS
1.0000 | ORAL_TABLET | ORAL | Status: DC | PRN
  Administered 2014-01-03 – 2014-01-04 (×2): 2 via ORAL
  Filled 2014-01-03 (×2): qty 2

## 2014-01-03 MED ORDER — DIPHENHYDRAMINE HCL 25 MG PO CAPS: 25.0000 mg | ORAL_CAPSULE | Freq: Every evening | ORAL | Status: DC | PRN

## 2014-01-03 MED ORDER — ONDANSETRON HCL 4 MG PO TABS: 4.0000 mg | ORAL_TABLET | Freq: Four times a day (QID) | ORAL | Status: DC | PRN

## 2014-01-03 MED ORDER — ONDANSETRON HCL 4 MG/2ML IJ SOLN: 4.0000 mg | Freq: Four times a day (QID) | INTRAMUSCULAR | Status: DC | PRN

## 2014-01-03 MED ORDER — SODIUM CHLORIDE 0.9 % IV SOLN: INTRAVENOUS | Status: DC

## 2014-01-03 MED ORDER — HYDROMORPHONE HCL PF 1 MG/ML IJ SOLN: 1.0000 mg | INTRAMUSCULAR | Status: DC | PRN

## 2014-01-03 MED ORDER — ACETAMINOPHEN 325 MG PO TABS: 650.0000 mg | ORAL_TABLET | Freq: Four times a day (QID) | ORAL | Status: DC | PRN

## 2014-01-03 MED ORDER — SODIUM CHLORIDE 0.9 % IJ SOLN
3.0000 mL | Freq: Two times a day (BID) | INTRAMUSCULAR | Status: DC
  Administered 2014-01-04: 3 mL via INTRAVENOUS

## 2014-01-03 MED ORDER — ALUM & MAG HYDROXIDE-SIMETH 200-200-20 MG/5ML PO SUSP: 30.0000 mL | Freq: Four times a day (QID) | ORAL | Status: DC | PRN

## 2014-01-03 MED ORDER — EZETIMIBE 10 MG PO TABS
10.0000 mg | ORAL_TABLET | Freq: Every day | ORAL | Status: DC
  Administered 2014-01-03 – 2014-01-05 (×3): 10 mg via ORAL
  Filled 2014-01-03 (×5): qty 1

## 2014-01-03 NOTE — ED Notes (Signed)
[facility], per report pt was seen today @ [facility] for eval of bil lower back pain, while @ [facility] the spouse of the pt reported an episode aphasia lasting 5 mins that resolved x2 wks ago, since that time the pt is reported to have unsteady gait & states, "The patient is slow." pt reported to have slurred speech since that time, denies any further episodes of neuro complications since that time, pt A&O x4, follows commands, speaks in complete upon arrival to ED

## 2014-01-03 NOTE — ED Notes (Signed)
Steinl. MD at bedside, aware of BP 86/52
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
## 2014-01-03 NOTE — ED Notes (Signed)
Pt  Reports  Low back pain      X  2  Weeks   Pt         Seen        1  Week ago  For   Muscle  Strain           Pt reports     Some   thickning  Of  Speech  Weakness   And         decreased  Appetite   Color  Is  Pale           Extensive  Medical        History
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
## 2014-01-03 NOTE — ED Notes (Signed)
Placed  On  Cardiac monitor  Nasal  o2  At  2  l  /  Min     Iv  Ns  18  Angio  l  Arm  1  Att
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
## 2014-01-04 ENCOUNTER — Inpatient Hospital Stay (HOSPITAL_COMMUNITY): Payer: Medicare Other

## 2014-01-04 ENCOUNTER — Encounter (HOSPITAL_COMMUNITY): Payer: Self-pay | Admitting: *Deleted

## 2014-01-04 ENCOUNTER — Encounter (HOSPITAL_COMMUNITY)
Admission: EM | Disposition: A | Discharge status: Home / Self Care | Payer: Self-pay | Source: Home / Self Care | Attending: Internal Medicine

## 2014-01-04 HISTORY — PX: ESOPHAGOGASTRODUODENOSCOPY: SHX5428

## 2014-01-04 LAB — TYPE AND SCREEN
ABO/RH(D): A POS
ANTIBODY SCREEN: NEGATIVE
UNIT DIVISION: 0
Unit division: 0

## 2014-01-04 LAB — COMPREHENSIVE METABOLIC PANEL
ALT: 28 U/L (ref 0–53)
ANION GAP: 10 (ref 5–15)
AST: 36 U/L (ref 0–37)
Albumin: 2.5 g/dL — ABNORMAL LOW (ref 3.5–5.2)
Alkaline Phosphatase: 53 U/L (ref 39–117)
BILIRUBIN TOTAL: 1.1 mg/dL (ref 0.3–1.2)
BUN: 32 mg/dL — AB (ref 6–23)
CHLORIDE: 109 meq/L (ref 96–112)
CO2: 19 meq/L (ref 19–32)
CREATININE: 0.88 mg/dL (ref 0.50–1.35)
Calcium: 7.7 mg/dL — ABNORMAL LOW (ref 8.4–10.5)
GFR calc Af Amer: >90 mL/min (ref >90)
GFR, EST NON AFRICAN AMERICAN: 83 mL/min — AB (ref >90)
Glucose, Bld: 86 mg/dL (ref 70–99)
Potassium: 4 mEq/L (ref 3.7–5.3)
Sodium: 138 mEq/L (ref 137–147)
Total Protein: 4.9 g/dL — ABNORMAL LOW (ref 6.0–8.3)

## 2014-01-04 LAB — CBC
HEMATOCRIT: 23.1 % — AB (ref 39.0–52.0)
HEMATOCRIT: 24.3 % — AB (ref 39.0–52.0)
HEMOGLOBIN: 8.4 g/dL — AB (ref 13.0–17.0)
Hemoglobin: 8.1 g/dL — ABNORMAL LOW (ref 13.0–17.0)
MCH: 32.8 pg (ref 26.0–34.0)
MCH: 33.5 pg (ref 26.0–34.0)
MCHC: 35.1 g/dL (ref 30.0–36.0)
MCHC: 34.6 g/dL (ref 30.0–36.0)
MCV: 93.5 fL (ref 78.0–100.0)
MCV: 96.8 fL (ref 78.0–100.0)
Platelets: 131 10*3/uL — ABNORMAL LOW (ref 150–400)
Platelets: 127 10*3/uL — ABNORMAL LOW (ref 150–400)
RBC: 2.47 MIL/uL — AB (ref 4.22–5.81)
RBC: 2.51 MIL/uL — AB (ref 4.22–5.81)
RDW: 17 % — ABNORMAL HIGH (ref 11.5–15.5)
RDW: 18 % — ABNORMAL HIGH (ref 11.5–15.5)
WBC: 5.7 10*3/uL (ref 4.0–10.5)
WBC: 4.9 10*3/uL (ref 4.0–10.5)

## 2014-01-04 LAB — VITAMIN B12: Vitamin B-12: 696 pg/mL (ref 211–911)

## 2014-01-04 SURGERY — EGD (ESOPHAGOGASTRODUODENOSCOPY)
Anesthesia: Moderate Sedation

## 2014-01-04 MED ORDER — SODIUM CHLORIDE 0.9 % IV SOLN
INTRAVENOUS | Status: DC
  Administered 2014-01-04: 500 mL via INTRAVENOUS

## 2014-01-04 MED ORDER — MIDAZOLAM HCL 10 MG/2ML IJ SOLN
INTRAMUSCULAR | Status: DC | PRN
  Administered 2014-01-04 (×2): 1 mg via INTRAVENOUS
  Administered 2014-01-04: 2 mg via INTRAVENOUS

## 2014-01-04 MED ORDER — BUTAMBEN-TETRACAINE-BENZOCAINE 2-2-14 % EX AERO
INHALATION_SPRAY | CUTANEOUS | Status: DC | PRN
  Administered 2014-01-04: 2 via TOPICAL

## 2014-01-04 MED ORDER — MIDAZOLAM HCL 5 MG/ML IJ SOLN
INTRAMUSCULAR | Status: AC
Start: 2014-01-04 — End: 2014-01-04
  Filled 2014-01-04: qty 2

## 2014-01-04 MED ORDER — FENTANYL CITRATE 0.05 MG/ML IJ SOLN
INTRAMUSCULAR | Status: AC
  Filled 2014-01-04: qty 2

## 2014-01-04 MED ORDER — CARVEDILOL 6.25 MG PO TABS
6.2500 mg | ORAL_TABLET | Freq: Two times a day (BID) | ORAL | Status: DC
  Administered 2014-01-04 – 2014-01-06 (×4): 6.25 mg via ORAL
  Filled 2014-01-04 (×5): qty 1

## 2014-01-04 MED ORDER — ZOLPIDEM TARTRATE 5 MG PO TABS: 5.0000 mg | ORAL_TABLET | Freq: Every evening | ORAL | Status: DC | PRN

## 2014-01-04 MED ORDER — FENTANYL CITRATE 0.05 MG/ML IJ SOLN
INTRAMUSCULAR | Status: DC | PRN
  Administered 2014-01-04 (×2): 25 ug via INTRAVENOUS

## 2014-01-04 MED ORDER — DIPHENHYDRAMINE HCL 25 MG PO CAPS: 50.0000 mg | ORAL_CAPSULE | Freq: Every evening | ORAL | Status: DC | PRN

## 2014-01-04 MED ORDER — DIPHENHYDRAMINE HCL 25 MG PO CAPS
25.0000 mg | ORAL_CAPSULE | Freq: Every evening | ORAL | Status: DC | PRN
  Administered 2014-01-04: 50 mg via ORAL
  Administered 2014-01-05: 25 mg via ORAL
  Filled 2014-01-04: qty 2
  Filled 2014-01-04: qty 1

## 2014-01-04 MED ORDER — CARVEDILOL 12.5 MG PO TABS: 12.5000 mg | ORAL_TABLET | Freq: Two times a day (BID) | ORAL | Status: DC

## 2014-01-04 NOTE — Progress Notes (Signed)
Utilization review completed.  

## 2014-01-04 NOTE — Clinical Documentation Improvement (Signed)
"  Anemia" documented in the current record. EGD shows gastric ulcer.  Hgb 6.5 on admission; transfused 2 u PRBC. Repeat Hgb after transfusion 8.1  Possible Clinical Conditions: -Acute Blood Loss Anemia secondary to ...... -Other Type of Anemia -Other Condition  -Unable to Clinically Determine  Thank You, Gatha Mayer ,RN Clinical Documentation Specialist:    [phone] [place] Information Management

## 2014-01-04 NOTE — Op Note (Signed)
[facility] [place], [id number]   OPERATIVE PROCEDURE REPORT  PATIENT: Stephen [name], Stephen [name]  MR#: [id number] BIRTHDATE: 02-21-1941  GENDER: Male ENDOSCOPIST: Carol Ada, MD ASSISTANT:   Tedra Coupe, and Mariana Single, RN PROCEDURE DATE: 01/04/2014 PROCEDURE:   EGD, diagnostic ASA CLASS:   Class III INDICATIONS:Upper GI bleed. MEDICATIONS: Fentanyl 50 mcg IV and Versed 4 mg IV TOPICAL ANESTHETIC:   none  DESCRIPTION OF PROCEDURE:   After the risks benefits and alternatives of the procedure were thoroughly explained, informed consent was obtained.  The PENTAX GASTOROSCOPE [id number]  endoscope was introduced through the mouth  and advanced to the second portion of the duodenum Without limitations.      The instrument was slowly withdrawn as the mucosa was fully examined.      FINDINGS: The Z-line was sharp.  No evidence of esophagitis.  In the most proximal portion of the gastric cardia an ulcer with a hemocystic spot versus visible vessel was identified.  This ulcer was not malignant appearing.  It was better viewed in the retroflexed position.  No bleeding was noted and treatment of this site was felt to be difficult.  Since it was not bleeding, I did not attempt to place a hemoclip.  The gastric body was normal and the duodenum was normal.          The scope was then withdrawn from the patient and the procedure terminated.  COMPLICATIONS: There were no complications.  IMPRESSION: 1) Gastric cardia ulcer with hemocystic spot versus visible vessel.  RECOMMENDATIONS: 1) PPI. 2) Avoid any anticoagulation. 3) Follow HGB.  _______________________________ eSignedCarol Ada, MD 01/04/2014 12:20 PM

## 2014-01-04 NOTE — Progress Notes (Signed)
OT Cancellation Note  Patient Details Name: Stephen [name] MRN: [id number] DOB: 1941/05/16   Cancelled Treatment:    Reason Eval/Treat Not Completed: Patient at procedure or test/ unavailable  Benito Mccreedy OTR/L [id number] 01/04/2014, 11:55 AM
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
## 2014-01-04 NOTE — Interval H&P Note (Signed)
History and Physical Interval Note:  01/04/2014 12:02 PM  Stephen [name]  has presented today for surgery, with the diagnosis of GI bleed  The various methods of treatment have been discussed with the patient and family. After consideration of risks, benefits and other options for treatment, the patient has consented to  Procedure(s): ESOPHAGOGASTRODUODENOSCOPY (EGD) (N/A) as a surgical intervention .  The patient's history has been reviewed, patient examined, no change in status, stable for surgery.  I have reviewed the patient's chart and labs.  Questions were answered to the patient's satisfaction.     Mareesa Gathright D
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
## 2014-01-04 NOTE — Progress Notes (Addendum)
Subjective: No acute changes  Exam: Filed Vitals:   01/04/14 1500  BP:   Pulse:   Temp: 98.7 F (37.1 C)  Resp:    Gen: In bed, NAD MS: Awake, alert, oriented and appropriate.  [id number], PERRL, EOMI Motor: has good strength to confrontation, possible mild pronator drift on the right.  Sensory:intact to LT  EEG negative  Impression: 73 yo M with R SDH and slurred speech I suspect secondary to the SDH. He had very mild possible right sided drift, which may be from his old infarct seen on MRI.   Dr. Ellene Route has evaluated him and feels that conservative therapy is appropriate at this time, and his symptoms do appear relatively mild.   With acute blood loss anemia and SDH, I agree with holding antiplatelet medications at this time. He is at risk, however, given his cardiovascular history and cerebrovascular disease.   Recommendations: 1) Agree with holding antiplatelets for now. 2) SDH management per neurosurgery.  3) Neurology to sign off at this time. Please call with any further questions or concerns.   Roland Rack, MD [facility] [phone]  If 7pm- 7am, please page neurology on call as listed in [place].

## 2014-01-04 NOTE — Procedures (Signed)
History: 73 yo M with slurred speech, SDH  Sedation: None  Technique: This is a 17 channel routine scalp EEG performed at the bedside with bipolar and monopolar montages arranged in accordance to the international 10/20 system of electrode placement. One channel was dedicated to EKG recording.    Background: The background consists of intermixed alpha and beta activities. There is a well defined posterior dominant rhythm of 8.5 Hz that attenuates with eye opening. There was no drowsiness or sleep recorded.   Photic stimulation: Physiologic driving is not performed  EEG Abnormalities: None  Clinical Interpretation: This normal EEG is recorded in the waking state. There was no seizure or seizure predisposition recorded on this study.   Roland Rack, MD [facility] [phone]  If 7pm- 7am, please page neurology on call as listed in [place].

## 2014-01-04 NOTE — Progress Notes (Signed)
EEG Completed; Results Pending  

## 2014-01-05 ENCOUNTER — Encounter (HOSPITAL_COMMUNITY): Payer: Self-pay | Admitting: Gastroenterology

## 2014-01-05 DIAGNOSIS — K254 Chronic or unspecified gastric ulcer with hemorrhage: Principal | ICD-10-CM

## 2014-01-05 DIAGNOSIS — D62 Acute posthemorrhagic anemia: Secondary | ICD-10-CM

## 2014-01-05 DIAGNOSIS — Z8673 Personal history of transient ischemic attack (TIA), and cerebral infarction without residual deficits: Secondary | ICD-10-CM

## 2014-01-05 DIAGNOSIS — I959 Hypotension, unspecified: Secondary | ICD-10-CM

## 2014-01-05 LAB — CBC
HEMATOCRIT: 23 % — AB (ref 39.0–52.0)
Hemoglobin: 7.9 g/dL — ABNORMAL LOW (ref 13.0–17.0)
MCH: 32.6 pg (ref 26.0–34.0)
MCHC: 34.3 g/dL (ref 30.0–36.0)
MCV: 95 fL (ref 78.0–100.0)
Platelets: 137 10*3/uL — ABNORMAL LOW (ref 150–400)
RBC: 2.42 MIL/uL — ABNORMAL LOW (ref 4.22–5.81)
RDW: 17.9 % — AB (ref 11.5–15.5)
WBC: 5.6 10*3/uL (ref 4.0–10.5)

## 2014-01-05 LAB — APTT: APTT: 35 s (ref 24–37)

## 2014-01-05 LAB — PROTIME-INR
INR: 1.22 (ref 0.00–1.49)
PROTHROMBIN TIME: 15.4 s — AB (ref 11.6–15.2)

## 2014-01-05 LAB — FOLATE: Folate: 18.3 ng/mL

## 2014-01-05 NOTE — Progress Notes (Signed)
Subjective: No acute events.  Feels well.  He was able to sleep four hours continuously for the first time in 16 days.  Objective: Vital signs in last 24 hours: Temp:  [98.2 F (36.8 C)-99.9 F (37.7 C)] 99.9 F (37.7 C) (08/06 0022) Pulse Rate:  [58-79] 65 (08/06 0712) Resp:  [12-23] 18 (08/06 0712) BP: (115-161)/(52-83) 117/55 mmHg (08/06 0712) SpO2:  [96 %-100 %] 100 % (08/06 0712) Last BM Date: 01/03/14  Intake/Output from previous day: 08/05 0701 - 08/06 0700 In: 400 [I.V.:400] Out: 500 [Urine:500] Intake/Output this shift:    General appearance: alert and no distress GI: soft, non-tender; bowel sounds normal; no masses,  no organomegaly  Lab Results:  Recent Labs  01/04/14 0333 01/04/14 1650 01/05/14 0347  WBC 5.7 4.9 5.6  HGB 8.1* 8.4* 7.9*  HCT 23.1* 24.3* 23.0*  PLT 131* 127* 137*   BMET  Recent Labs  01/03/14 1500 01/04/14 0333  NA 139 138  K 4.2 4.0  CL 107 109  CO2 20 19  GLUCOSE 119* 86  BUN 44* 32*  CREATININE 0.89 0.88  CALCIUM 8.4 7.7*   LFT  Recent Labs  01/04/14 0333  PROT 4.9*  ALBUMIN 2.5*  AST 36  ALT 28  ALKPHOS 53  BILITOT 1.1   PT/INR  Recent Labs  01/05/14 0347  LABPROT 15.4*  INR 1.22   Hepatitis Panel No results found for this basename: HEPBSAG, HCVAB, HEPAIGM, HEPBIGM,  in the last 72 hours C-Diff No results found for this basename: CDIFFTOX,  in the last 72 hours Fecal Lactopherrin No results found for this basename: FECLLACTOFRN,  in the last 72 hours  Studies/Results: Dg Lumbar Spine Complete  01/03/2014   CLINICAL DATA:  Low back pain, recent fall.  EXAM: LUMBAR SPINE - COMPLETE 4+ VIEW  COMPARISON:  CT of the abdomen and pelvis April 18, 2011  FINDINGS: Lumbar vertebral bodies appear intact and aligned with maintenance of the lumbar lordosis. Moderate to severe L2-3, at least moderate L4-5 and L5-S1 disc degeneration, similar. Mild broad levoscoliosis. No pars interarticularis defects. Mild lower  lumbar facet arthropathy. No destructive bony lesions.  Aortoiliac vascular calcifications. Sacroiliac joints are symmetric. Mild amount of retained large bowel stool.  IMPRESSION: No acute lumbar spine fracture deformity or malalignment.  Similar lumbar spondylosis.   Electronically Signed   By: Elon Alas   On: 01/03/2014 14:50   Ct Head Wo Contrast  01/03/2014   ADDENDUM REPORT: 01/03/2014 15:09  ADDENDUM: Critical Value/emergent results were called by telephone at the time of interpretation on 01/03/2014 at 1502 hr to Dr. Lajean Saver , who verbally acknowledged these results.   Electronically Signed   By: Lars Pinks M.D.   On: 01/03/2014 15:09   01/03/2014   CLINICAL DATA:  73 year old male with episode of aphasia, altered mental status, now with unsteady gait. Initial encounter.  EXAM: CT HEAD WITHOUT CONTRAST  TECHNIQUE: Contiguous axial images were obtained from the base of the skull through the vertex without intravenous contrast.  COMPARISON:  None.  FINDINGS: No acute osseous abnormality identified. Visualized paranasal sinuses and mastoids are clear. No scalp hematoma identified. Visualized orbit soft tissues are within normal limits.  Calcified atherosclerosis at the skull base.  Mixed density right side subdural hematoma measuring up to 10-15 mm in thickness. Leftward midline shift of 5 mm. Mild mass effect on the right lateral ventricle. Questionable small volume of para fall seen blood (versus chronic dural calcification).  Basilar cisterns are preserved.  No intraventricular hemorrhage. No intra-axial hemorrhage identified. 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 No evidence of cortically based acute infarction identified. No suspicious intracranial vascular hyperdensity.  IMPRESSION: 1. Acute left subdural hematoma, 10-15 mm in thickness and associated with 5 mm of leftward midline shift. 2. No ventriculomegaly or other acute intracranial hemorrhage. 3. Scattered small chronic  infarcts in the left cerebellum.  Electronically Signed: By: Lars Pinks M.D. On: 01/03/2014 14:58   Mr Brain Wo Contrast  01/04/2014   ADDENDUM REPORT: 01/04/2014 15:05  ADDENDUM: Critical Value/emergent results were called by telephone at the time of interpretation on 01/04/2014 at 1447 hrs. to Dr. Thereasa Solo, who verbally acknowledged these results.   Electronically Signed   By: Lars Pinks M.D.   On: 01/04/2014 15:05   01/04/2014   CLINICAL DATA:  73 year old male with right subdural hematoma detected yesterday for evaluation of altered mental status, abnormal gait and speech. Initial encounter.  EXAM: MRI HEAD WITHOUT CONTRAST  TECHNIQUE: Multiplanar, multiecho pulse sequences of the brain and surrounding structures were obtained without intravenous contrast.  COMPARISON:  Head CT 01/03/2014.  FINDINGS: Right side widespread subdural hematoma re- identified, measuring up to 15 mm maximal thickness (most areas 8 mm in thickness).  However, there is also a thin left side subdural hematoma, measuring 5 mm in thickness (most areas 3-4 mm). See series 10 images 55 and 57, and series 11 images 8 and 14.  Combined, there is leftward midline shift of 5 mm, stable.  No intraventricular hemorrhage. No ventriculomegaly. Basilar cisterns are patent. Pituitary, cervicomedullary junction and visualized cervical spine are within normal limits. No restricted diffusion or evidence of acute infarction.  Multiple small chronic infarcts in the left cerebellar hemisphere. Small area of cortical encephalomalacia in the left inferior frontal gyrus near the operculum. Up to mild cerebral edema underlying the largest component of the right subdural hematoma (series 7, image 16). Elsewhere gray and white matter signal within normal limits. Incidental choroid plexus cysts. No chronic parenchymal hemorrhage identified.  Visible internal auditory structures appear normal. Visualized paranasal sinuses and mastoids are clear. Visualized orbit soft  tissues are within normal limits. Visualized scalp soft tissues are within normal limits. Visualized bone marrow signal is within normal limits.  IMPRESSION: 1. Right subdural hematoma re- identified, widespread in measuring up to 15 mm maximal thickness. 2. However, there is also a smaller left subdural hematoma measuring up to 5 mm in thickness. 3. Stable intracranial mass effect with leftward midline shift 5 mm. 4. Mild right operculum edema underlying the low thickness component of the right subdural bleed. 5. Chronic medium-sized vessel ischemia in the left frontal lobe and left cerebellum.  Electronically Signed: By: Lars Pinks M.D. On: 01/04/2014 14:47    Medications:  Scheduled: . atorvastatin  10 mg Oral q1800  . carvedilol  6.25 mg Oral BID WC  . ezetimibe  10 mg Oral Daily  . multivitamin with minerals  1 tablet Oral Daily  . pantoprazole (PROTONIX) IV  40 mg Intravenous Q12H   Continuous: . sodium chloride 100 mL/hr at 01/03/14 1857    Assessment/Plan: 1) Gastric cardia ulcer with possible visible vessel. 2) Subdural hematoma. 3) CAD.   His HGB is overall stable and clinically he is improved.  Since I cannot discern if there was a visible vessel, I am recommending another stay in the hospital before discharge.  Ulcers with a visible vessel have a 60% rebleed rate within 72 hours.  As for his anticoagulation, I  was told by the Dr. Tana Coast, who spoke with Cardiology that he does not require Brilinta anymore.  However, with his CAD, he can resume his ASA.  The risk/benefit is in favor of him to get back on ASA with indefinite PPI QD.  Plan: 1) PPI QD. 2) Okay for ASA. 3) One more day of observation.  LOS: 2 days   Caisley Baxendale D 01/05/2014, 7:16 AM

## 2014-01-05 NOTE — Progress Notes (Signed)
Pt tx 4N30 per MD order, pt VSS, pt verbalized understanding of tx, report called, all questions answered,

## 2014-01-05 NOTE — Progress Notes (Signed)
Patient ID: Stephen [name], male   DOB: 05-05-1941, 74 y.o.   MRN: [id number] Alert and oriented No evidence of a drift Patient is feeling quite well He'll follow this patient as an outpatient in approximately 3 weeks with repeat CT scan to check the size of the subdural.
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
## 2014-01-05 NOTE — Evaluation (Signed)
Occupational Therapy Evaluation Patient Details Name: Stephen [name] MRN: [id number] DOB: 14-May-1941 Today's Date: 01/05/2014    History of Present Illness Patient is a 73 yo male who presents with AMS in the setting of  bilateral subdural hematomas with the right much larger than the left. Slight right-to-left shift. Patient also with GIB and anemia upon admission. Possible hx of CVA, workup pending.    Clinical Impression   Pt currently close to baseline but did not donn prosthesis this session to further evaluate balance and functional transfers related to selfcare tasks.  He was able to transfer to the EOB and perform sit to stand with supervision and UE support on the bedrail.  Anticipate pt will do well but will benefit from at least one more acute OT visit to further determine level of assist with dynamic balance and selfcare tasks in standing.  Will continue to follow in acute care but do not anticipate any post acute OT needs or DME.    Follow Up Recommendations  No OT follow up    Equipment Recommendations  None recommended by OT       Precautions / Restrictions Precautions Precautions: Fall Precaution Comments: History of R BKA Required Braces or Orthoses: Other Brace/Splint Other Brace/Splint: Right Prosthesis Restrictions Weight Bearing Restrictions: No      Mobility Bed Mobility Overal bed mobility: Independent                Transfers Overall transfer level: Needs assistance   Transfers: Sit to/from Stand Sit to Stand: Supervision         General transfer comment: Pt able to stand without prosthesis but needs UE support on the left.    Balance Overall balance assessment: Needs assistance   Sitting balance-Leahy Scale: Normal       Standing balance-Leahy Scale: Fair                              ADL Overall ADL's : Needs assistance/impaired Eating/Feeding: Independent   Grooming: Wash/dry hands;Wash/dry  face;Sitting;Supervision/safety   Upper Body Bathing: Supervision/ safety;Sitting   Lower Body Bathing: Supervison/ safety;Sit to/from stand   Upper Body Dressing : Set up;Sitting   Lower Body Dressing: Supervision/safety;Sit to/from stand                 General ADL Comments: Pt with limited eval secondary to not wanting to donn his prothesis this session and mobiliize.  Pt was able to transfer to the EOB and perform standing with support of bed rail and supervision.          Perception Perception Perception Tested?: No   Praxis Praxis Praxis tested?: Not tested    Pertinent Vitals/Pain Pain Assessment: Faces Faces Pain Scale: Hurts a little bit Pain Location: Back Pain Intervention(s): Monitored during session;Repositioned     Hand Dominance Right   Extremity/Trunk Assessment Upper Extremity Assessment Upper Extremity Assessment: Overall WFL for tasks assessed   Lower Extremity Assessment Lower Extremity Assessment: Defer to PT evaluation   Cervical / Trunk Assessment Cervical / Trunk Assessment: Normal   Communication Communication Communication: No difficulties   Cognition Arousal/Alertness: Awake/alert Behavior During Therapy: WFL for tasks assessed/performed Overall Cognitive Status: Within Functional Limits for tasks assessed                                Home Living Family/patient expects to be discharged to::  Private residence Living Arrangements: Spouse/significant other Available Help at Discharge: Family;Available 24 hours/day Type of Home: House Home Access: Stairs to enter [employer] of Steps: 4 Entrance Stairs-Rails: Right Home Layout: One level     Bathroom Shower/Tub: Occupational psychologist: Handicapped height     Home Equipment: Environmental consultant - 2 wheels;Bedside commode;Cane - single point   Additional Comments: Prothesis for R BKA      Prior Functioning/Environment Level of Independence:  Independent             OT Diagnosis: Generalized weakness   OT Problem List: Decreased strength;Impaired balance (sitting and/or standing)   OT Treatment/Interventions: Self-care/ADL training;Neuromuscular education;DME and/or AE instruction;Therapeutic activities;Balance training;Patient/family education    OT Goals(Current goals can be found in the care plan section) Acute Rehab OT Goals Patient Stated Goal: To go home as soon as possible OT Goal Formulation: With patient Time For Goal Achievement: 01/12/14 Potential to Achieve Goals: Good  OT Frequency: Min 2X/week              End of Session Nurse Communication: Mobility status  Activity Tolerance: Patient tolerated treatment well Patient left: in bed;with family/visitor present;with call bell/phone within reach   Time: [id number] OT Time Calculation (min): 24 min Charges:  OT General Charges $OT Visit: 1 Procedure OT Evaluation $Initial OT Evaluation Tier I: 1 Procedure OT Treatments $Self Care/Home Management : 8-22 mins G-Codes:    Jessy Cybulski OTR/L 02/03/14, 2:03 PM

## 2014-01-05 NOTE — Evaluation (Signed)
Physical Therapy Evaluation Patient Details Name: Stephen [name] MRN: [id number] DOB: 1940-08-27 Today's Date: 01/05/2014   History of Present Illness  Patient is a 73 yo male who presents with AMS in the setting of  bilateral subdural hematomas with the right much larger than the left. Slight right-to-left shift. Patient also with GIB and anemia upon admission. Possible hx of CVA, workup pending.   Clinical Impression  Patient demonstrates deficits in mobility as indicated below. Limited assessment of ambulation secondary to no-prosthesis. Will continue to see and evaluate stair negotiation and ambulation next session. Anticipate patient will be safe for dc home with family support.     Follow Up Recommendations Supervision - Intermittent    Equipment Recommendations  None recommended by PT    Recommendations for Other Services       Precautions / Restrictions Precautions Precautions: Fall Precaution Comments: History of R BKA Required Braces or Orthoses: Other Brace/Splint Other Brace/Splint: Right Prosthesis Restrictions Weight Bearing Restrictions: No      Mobility  Bed Mobility Overal bed mobility: Independent                Transfers Overall transfer level: Modified independent                  Ambulation/Gait             General Gait Details: to be assessed with prosthesis next session  Stairs Stairs:  (to be assessed with prosthesis next session)          Wheelchair Mobility    Modified Rankin (Stroke Patients Only)       Balance                                             Pertinent Vitals/Pain Reports no pain at this time    Home Living Family/patient expects to be discharged to:: Private residence Living Arrangements: Spouse/significant other Available Help at Discharge: Family;Available 24 hours/day Type of Home: House Home Access: Stairs to enter Entrance Stairs-Rails: Right Entrance Stairs-Number  of Steps: 4 Home Layout: One level Home Equipment: Walker - 2 wheels;Bedside commode;Cane - single point Additional Comments: Prothesis for R BKA    Prior Function Level of Independence: Independent               Hand Dominance   Dominant Hand: Right    Extremity/Trunk Assessment   Upper Extremity Assessment: Overall WFL for tasks assessed           Lower Extremity Assessment: Overall WFL for tasks assessed (R BKA)         Communication   Communication: No difficulties  Cognition Arousal/Alertness: Awake/alert Behavior During Therapy: WFL for tasks assessed/performed Overall Cognitive Status: Within Functional Limits for tasks assessed (patient A&Ox3 with no evident cognitive deficits this am.)                      General Comments      Exercises        Assessment/Plan    PT Assessment Patient needs continued PT services  PT Diagnosis Abnormality of gait;Altered mental status   PT Problem List Decreased mobility;Pain  PT Treatment Interventions DME instruction;Gait training;Stair training;Functional mobility training;Therapeutic activities;Therapeutic exercise;Balance training;Patient/family education   PT Goals (Current goals can be found in the Care Plan section) Acute Rehab PT Goals Patient Stated Goal: to go  home tomorrow PT Goal Formulation: With patient Time For Goal Achievement: 01/19/14 Potential to Achieve Goals: Good    Frequency Min 4X/week   Barriers to discharge        Co-evaluation               End of Session   Activity Tolerance: Patient tolerated treatment well Patient left: in chair;with call bell/phone within reach Nurse Communication: Mobility status         Time: 1030-1047 PT Time Calculation (min): 17 min   Charges:   PT Evaluation $Initial PT Evaluation Tier I: 1 Procedure PT Treatments $Therapeutic Activity: 8-22 mins   PT G CodesDuncan Dull 01/05/2014, 11:35 AM Alben Deeds, PT DPT  [phone]

## 2014-01-05 NOTE — Progress Notes (Signed)
Pt TX to 4N30 Via bed accompanied by RN and tech. All belongings went with the pt.4N RN at bed side. Monitor removed and Tele placed on pt. Call bell in reach.

## 2014-01-06 LAB — CBC
HCT: 25.9 % — ABNORMAL LOW (ref 39.0–52.0)
Hemoglobin: 8.9 g/dL — ABNORMAL LOW (ref 13.0–17.0)
MCH: 33 pg (ref 26.0–34.0)
MCHC: 34.4 g/dL (ref 30.0–36.0)
MCV: 95.9 fL (ref 78.0–100.0)
PLATELETS: 168 10*3/uL (ref 150–400)
RBC: 2.7 MIL/uL — ABNORMAL LOW (ref 4.22–5.81)
RDW: 17.2 % — AB (ref 11.5–15.5)
WBC: 5.6 10*3/uL (ref 4.0–10.5)

## 2014-01-06 MED ORDER — PANTOPRAZOLE SODIUM 40 MG PO TBEC: 40.0000 mg | DELAYED_RELEASE_TABLET | Freq: Two times a day (BID) | ORAL | Status: DC

## 2014-01-06 MED ORDER — POLYSACCHARIDE IRON COMPLEX 150 MG PO CAPS: 150.0000 mg | ORAL_CAPSULE | Freq: Every day | ORAL | Status: DC

## 2014-01-06 MED ORDER — PANTOPRAZOLE SODIUM 40 MG PO TBEC
40.0000 mg | DELAYED_RELEASE_TABLET | Freq: Two times a day (BID) | ORAL | Status: DC
  Administered 2014-01-06: 40 mg via ORAL
  Filled 2014-01-06: qty 1

## 2014-01-06 NOTE — Progress Notes (Signed)
Stephen [name] A&O x4; Stephen [name] discharge education and instructions completed with Stephen [name] and spouse at bedside. Both voices understanding and denies any questions. Stephen [name] IV and telemetry removed; Stephen [name] handed his prescriptions for Nu-iron and protonix. Stephen [name] spouse asked about Stephen [name] follow up appointment with neurology; MD paged for clarification and MD returns call back stating "He can go, neurology will call with appointment". Stephen [name] and spouse informed of MD statement and both voices understanding. Stephen [name] discharge home with spouse to transport Stephen [name] home. Stephen [name] transported off unit via wheelchair with spouse and belongings at side. Francis Gaines Kingsly Kloepfer RN.

## 2014-01-06 NOTE — Progress Notes (Signed)
Subjective: Feeling great.  No acute events.  Objective: Vital signs in last 24 hours: Temp:  [97.9 F (36.6 C)-100.8 F (38.2 C)] 98.7 F (37.1 C) (08/07 0659) Pulse Rate:  [64-83] 64 (08/07 0659) Resp:  [11-19] 18 (08/07 0659) BP: (117-139)/(54-59) 139/57 mmHg (08/07 0659) SpO2:  [95 %-100 %] 99 % (08/07 0659) Weight:  [124 lb (56.246 kg)-127 lb 12.8 oz (57.97 kg)] 127 lb 12.8 oz (57.97 kg) (08/06 2045) Last BM Date: 01/03/14  Intake/Output from previous day: 08/06 0701 - 08/07 0700 In: 1320 [P.O.:1320] Out: 1525 [Urine:1525] Intake/Output this shift:    General appearance: alert and no distress GI: soft, non-tender; bowel sounds normal; no masses,  no organomegaly  Lab Results:  Recent Labs  01/04/14 1650 01/05/14 0347 01/06/14 0802  WBC 4.9 5.6 5.6  HGB 8.4* 7.9* 8.9*  HCT 24.3* 23.0* 25.9*  PLT 127* 137* 168   BMET  Recent Labs  01/03/14 1500 01/04/14 0333  NA 139 138  K 4.2 4.0  CL 107 109  CO2 20 19  GLUCOSE 119* 86  BUN 44* 32*  CREATININE 0.89 0.88  CALCIUM 8.4 7.7*   LFT  Recent Labs  01/04/14 0333  PROT 4.9*  ALBUMIN 2.5*  AST 36  ALT 28  ALKPHOS 53  BILITOT 1.1   PT/INR  Recent Labs  01/05/14 0347  LABPROT 15.4*  INR 1.22   Hepatitis Panel No results found for this basename: HEPBSAG, HCVAB, HEPAIGM, HEPBIGM,  in the last 72 hours C-Diff No results found for this basename: CDIFFTOX,  in the last 72 hours Fecal Lactopherrin No results found for this basename: FECLLACTOFRN,  in the last 72 hours  Studies/Results: Mr Brain Wo Contrast  01/04/2014   ADDENDUM REPORT: 01/04/2014 15:05  ADDENDUM: Critical Value/emergent results were called by telephone at the time of interpretation on 01/04/2014 at 1447 hrs. to Dr. Thereasa Solo, who verbally acknowledged these results.   Electronically Signed   By: Lars Pinks M.D.   On: 01/04/2014 15:05   01/04/2014   CLINICAL DATA:  73 year old male with right subdural hematoma detected yesterday for  evaluation of altered mental status, abnormal gait and speech. Initial encounter.  EXAM: MRI HEAD WITHOUT CONTRAST  TECHNIQUE: Multiplanar, multiecho pulse sequences of the brain and surrounding structures were obtained without intravenous contrast.  COMPARISON:  Head CT 01/03/2014.  FINDINGS: Right side widespread subdural hematoma re- identified, measuring up to 15 mm maximal thickness (most areas 8 mm in thickness).  However, there is also a thin left side subdural hematoma, measuring 5 mm in thickness (most areas 3-4 mm). See series 10 images 55 and 57, and series 11 images 8 and 14.  Combined, there is leftward midline shift of 5 mm, stable.  No intraventricular hemorrhage. No ventriculomegaly. Basilar cisterns are patent. Pituitary, cervicomedullary junction and visualized cervical spine are within normal limits. No restricted diffusion or evidence of acute infarction.  Multiple small chronic infarcts in the left cerebellar hemisphere. Small area of cortical encephalomalacia in the left inferior frontal gyrus near the operculum. Up to mild cerebral edema underlying the largest component of the right subdural hematoma (series 7, image 16). Elsewhere gray and white matter signal within normal limits. Incidental choroid plexus cysts. No chronic parenchymal hemorrhage identified.  Visible internal auditory structures appear normal. Visualized paranasal sinuses and mastoids are clear. Visualized orbit soft tissues are within normal limits. Visualized scalp soft tissues are within normal limits. Visualized bone marrow signal is within normal limits.  IMPRESSION: 1. Right  subdural hematoma re- identified, widespread in measuring up to 15 mm maximal thickness. 2. However, there is also a smaller left subdural hematoma measuring up to 5 mm in thickness. 3. Stable intracranial mass effect with leftward midline shift 5 mm. 4. Mild right operculum edema underlying the low thickness component of the right subdural bleed.  5. Chronic medium-sized vessel ischemia in the left frontal lobe and left cerebellum.  Electronically Signed: By: Lars Pinks M.D. On: 01/04/2014 14:47    Medications:  Scheduled: . atorvastatin  10 mg Oral q1800  . carvedilol  6.25 mg Oral BID WC  . ezetimibe  10 mg Oral Daily  . multivitamin with minerals  1 tablet Oral Daily  . pantoprazole (PROTONIX) IV  40 mg Intravenous Q12H   Continuous:   Assessment/Plan: 1) Gastric ulcer bleed precipitated by Brilinta. 2) SDH acute on chronic.   HGB has improved.  No overt signs of bleeding.  Plan: 1) Okay to D/C home. 2) ASA 81 mg, but no other anticoagulation. 3) Follow up in the office in 2-4 weeks.   LOS: 3 days   Latrease Kunde D 01/06/2014, 9:47 AM

## 2014-01-06 NOTE — Progress Notes (Signed)
Patient transferred to 4N30, alert and oriented. Respirations even and unlabored, no C/O pain, NAD noted. Patient oriented to unit and equipment, tele monitoring initiated. Initial assessments completed. Will continue to monitor.
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
## 2014-01-06 NOTE — Progress Notes (Signed)
IM (Important Message from Medicare explaining patient's of their rights) given to the patient; Aneta Mins [id number]

## 2014-01-06 NOTE — Progress Notes (Signed)
Pt noted to be on telemetry during change of shift but noted not on monitor; CCMD called to confirm but MD comes in to assess pt; states pt is being discharge; MD asked if OK to go ahead and discontinue telemetry and MD states "Yes". Telemetry discontinued per MD. Will continue to monitor pt quietly. [name] RN.
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
## 2014-01-18 DIAGNOSIS — D500 Iron deficiency anemia secondary to blood loss (chronic): Secondary | ICD-10-CM | POA: Diagnosis not present

## 2014-01-18 DIAGNOSIS — K259 Gastric ulcer, unspecified as acute or chronic, without hemorrhage or perforation: Secondary | ICD-10-CM | POA: Diagnosis not present

## 2014-01-19 ENCOUNTER — Other Ambulatory Visit (HOSPITAL_COMMUNITY): Payer: Self-pay | Admitting: Neurological Surgery

## 2014-01-19 DIAGNOSIS — S065X9A Traumatic subdural hemorrhage with loss of consciousness of unspecified duration, initial encounter: Secondary | ICD-10-CM

## 2014-01-19 DIAGNOSIS — S065XAA Traumatic subdural hemorrhage with loss of consciousness status unknown, initial encounter: Secondary | ICD-10-CM

## 2014-02-02 ENCOUNTER — Ambulatory Visit (HOSPITAL_COMMUNITY)
Admission: RE | Admit: 2014-02-02 | Discharge: 2014-02-02 | Disposition: A | Discharge status: Home / Self Care | Payer: Medicare Other | Source: Ambulatory Visit | Attending: Neurological Surgery | Admitting: Neurological Surgery

## 2014-02-02 DIAGNOSIS — S065XAA Traumatic subdural hemorrhage with loss of consciousness status unknown, initial encounter: Secondary | ICD-10-CM

## 2014-02-02 DIAGNOSIS — I6789 Other cerebrovascular disease: Secondary | ICD-10-CM | POA: Diagnosis not present

## 2014-02-02 DIAGNOSIS — I6200 Nontraumatic subdural hemorrhage, unspecified: Secondary | ICD-10-CM | POA: Diagnosis not present

## 2014-02-02 DIAGNOSIS — S065X9A Traumatic subdural hemorrhage with loss of consciousness of unspecified duration, initial encounter: Secondary | ICD-10-CM

## 2014-02-03 ENCOUNTER — Other Ambulatory Visit (HOSPITAL_COMMUNITY): Payer: Self-pay | Admitting: Neurological Surgery

## 2014-02-03 DIAGNOSIS — I621 Nontraumatic extradural hemorrhage: Secondary | ICD-10-CM

## 2014-03-01 DIAGNOSIS — D500 Iron deficiency anemia secondary to blood loss (chronic): Secondary | ICD-10-CM | POA: Diagnosis not present

## 2014-03-01 DIAGNOSIS — K259 Gastric ulcer, unspecified as acute or chronic, without hemorrhage or perforation: Secondary | ICD-10-CM | POA: Diagnosis not present

## 2014-03-08 DIAGNOSIS — Z23 Encounter for immunization: Secondary | ICD-10-CM | POA: Diagnosis not present

## 2014-03-17 ENCOUNTER — Other Ambulatory Visit: Payer: Self-pay

## 2014-03-28 ENCOUNTER — Other Ambulatory Visit: Payer: Self-pay | Admitting: Cardiovascular Disease

## 2014-03-28 NOTE — Telephone Encounter (Signed)
Rx was sent to pharmacy electronically. 

## 2014-03-31 ENCOUNTER — Other Ambulatory Visit (HOSPITAL_COMMUNITY): Payer: Self-pay | Admitting: Cardiovascular Disease

## 2014-03-31 ENCOUNTER — Encounter (HOSPITAL_COMMUNITY): Payer: Self-pay | Admitting: *Deleted

## 2014-03-31 DIAGNOSIS — I714 Abdominal aortic aneurysm, without rupture, unspecified: Secondary | ICD-10-CM

## 2014-04-06 ENCOUNTER — Ambulatory Visit (HOSPITAL_COMMUNITY): Payer: Medicare Other

## 2014-04-07 ENCOUNTER — Other Ambulatory Visit (HOSPITAL_COMMUNITY): Payer: Self-pay | Admitting: Neurological Surgery

## 2014-04-07 DIAGNOSIS — S065X9A Traumatic subdural hemorrhage with loss of consciousness of unspecified duration, initial encounter: Secondary | ICD-10-CM

## 2014-04-07 DIAGNOSIS — S065XAA Traumatic subdural hemorrhage with loss of consciousness status unknown, initial encounter: Secondary | ICD-10-CM

## 2014-04-11 ENCOUNTER — Ambulatory Visit: Payer: Medicare Other | Admitting: Cardiovascular Disease

## 2014-04-13 ENCOUNTER — Ambulatory Visit (HOSPITAL_COMMUNITY)
Admission: RE | Admit: 2014-04-13 | Discharge: 2014-04-13 | Disposition: A | Discharge status: Home / Self Care | Payer: Medicare Other | Source: Ambulatory Visit | Attending: Neurological Surgery | Admitting: Neurological Surgery

## 2014-04-13 DIAGNOSIS — I6200 Nontraumatic subdural hemorrhage, unspecified: Secondary | ICD-10-CM | POA: Diagnosis not present

## 2014-04-13 DIAGNOSIS — Z8673 Personal history of transient ischemic attack (TIA), and cerebral infarction without residual deficits: Secondary | ICD-10-CM | POA: Insufficient documentation

## 2014-04-13 DIAGNOSIS — S065X9A Traumatic subdural hemorrhage with loss of consciousness of unspecified duration, initial encounter: Secondary | ICD-10-CM

## 2014-04-13 DIAGNOSIS — Z09 Encounter for follow-up examination after completed treatment for conditions other than malignant neoplasm: Secondary | ICD-10-CM | POA: Insufficient documentation

## 2014-04-13 DIAGNOSIS — S065XAA Traumatic subdural hemorrhage with loss of consciousness status unknown, initial encounter: Secondary | ICD-10-CM

## 2014-04-13 DIAGNOSIS — I6202 Nontraumatic subacute subdural hemorrhage: Secondary | ICD-10-CM | POA: Diagnosis not present

## 2014-04-19 ENCOUNTER — Telehealth: Payer: Self-pay | Admitting: Cardiovascular Disease

## 2014-04-19 NOTE — Telephone Encounter (Signed)
Records received from [facility] (Dr Kristeen Miss) for appointment on 12./1/15 with Dr Claiborne Billings.  Records given to [facility] (medical records) for Dr Evette Georges schedule on 05/02/14  lp

## 2014-04-20 ENCOUNTER — Ambulatory Visit (HOSPITAL_COMMUNITY)
Admission: RE | Admit: 2014-04-20 | Discharge: 2014-04-20 | Disposition: A | Discharge status: Home / Self Care | Payer: Medicare Other | Source: Ambulatory Visit | Attending: Cardiology | Admitting: Cardiology

## 2014-04-20 DIAGNOSIS — I723 Aneurysm of iliac artery: Secondary | ICD-10-CM | POA: Diagnosis not present

## 2014-04-20 DIAGNOSIS — I77811 Abdominal aortic ectasia: Secondary | ICD-10-CM | POA: Insufficient documentation

## 2014-04-20 DIAGNOSIS — I714 Abdominal aortic aneurysm, without rupture, unspecified: Secondary | ICD-10-CM

## 2014-04-20 DIAGNOSIS — I745 Embolism and thrombosis of iliac artery: Secondary | ICD-10-CM | POA: Diagnosis not present

## 2014-04-20 DIAGNOSIS — I729 Aneurysm of unspecified site: Secondary | ICD-10-CM | POA: Diagnosis present

## 2014-04-20 NOTE — Progress Notes (Signed)
Abdominal Aortic Duplex Completed °Brianna L Mazza,RVT °

## 2014-05-02 ENCOUNTER — Encounter: Payer: Self-pay | Admitting: Cardiovascular Disease

## 2014-05-02 ENCOUNTER — Ambulatory Visit (INDEPENDENT_AMBULATORY_CARE_PROVIDER_SITE_OTHER): Payer: Medicare Other | Admitting: Cardiovascular Disease

## 2014-05-02 VITALS — BP 118/70 | HR 51 | Ht 68.0 in | Wt 130.0 lb

## 2014-05-02 DIAGNOSIS — I6200 Nontraumatic subdural hemorrhage, unspecified: Secondary | ICD-10-CM | POA: Diagnosis not present

## 2014-05-02 DIAGNOSIS — I10 Essential (primary) hypertension: Secondary | ICD-10-CM | POA: Diagnosis not present

## 2014-05-02 DIAGNOSIS — Z89611 Acquired absence of right leg above knee: Secondary | ICD-10-CM

## 2014-05-02 DIAGNOSIS — I2510 Atherosclerotic heart disease of native coronary artery without angina pectoris: Secondary | ICD-10-CM | POA: Diagnosis not present

## 2014-05-02 DIAGNOSIS — I719 Aortic aneurysm of unspecified site, without rupture: Secondary | ICD-10-CM

## 2014-05-02 DIAGNOSIS — S065X9A Traumatic subdural hemorrhage with loss of consciousness of unspecified duration, initial encounter: Secondary | ICD-10-CM

## 2014-05-02 DIAGNOSIS — E785 Hyperlipidemia, unspecified: Secondary | ICD-10-CM | POA: Diagnosis not present

## 2014-05-02 DIAGNOSIS — I255 Ischemic cardiomyopathy: Secondary | ICD-10-CM

## 2014-05-02 DIAGNOSIS — S065XAA Traumatic subdural hemorrhage with loss of consciousness status unknown, initial encounter: Secondary | ICD-10-CM

## 2014-05-02 MED ORDER — EZETIMIBE 10 MG PO TABS: 10.0000 mg | ORAL_TABLET | Freq: Every day | ORAL | Status: DC
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
## 2014-05-02 NOTE — Patient Instructions (Signed)
Your physician has requested that you have a lexiscan myoview and office visit in 6 months.[name]

## 2014-05-11 ENCOUNTER — Encounter (HOSPITAL_COMMUNITY): Payer: Self-pay | Admitting: Cardiovascular Disease

## 2014-07-31 ENCOUNTER — Other Ambulatory Visit: Payer: Self-pay | Admitting: Cardiovascular Disease

## 2014-08-02 ENCOUNTER — Other Ambulatory Visit: Payer: Self-pay | Admitting: Cardiovascular Disease

## 2014-08-02 MED ORDER — ATORVASTATIN CALCIUM 20 MG PO TABS: 20.0000 mg | ORAL_TABLET | Freq: Every day | ORAL | Status: DC

## 2014-08-02 NOTE — Telephone Encounter (Signed)
°  1. Which medications need to be refilled? Atorvastatin 20mg   2. Which pharmacy is medication to be sent to?[facility] on [address] [phone]  3. Do they need a 30 day or 90 day supply? 30  4. Would they like a call back once the medication has been sent to the pharmacy? Yes   The Pharmacy states that they have called 3 times

## 2014-08-02 NOTE — Telephone Encounter (Signed)
Spoke with pt, aware refill sent to the pharmacy. 

## 2014-08-02 NOTE — Telephone Encounter (Signed)
Rx has been sent to the pharmacy electronically. ° °

## 2014-09-04 ENCOUNTER — Other Ambulatory Visit: Payer: Self-pay | Admitting: Cardiovascular Disease

## 2014-09-05 ENCOUNTER — Telehealth: Payer: Self-pay | Admitting: *Deleted

## 2014-09-05 NOTE — Telephone Encounter (Signed)
Rx refill sent to patient pharmacy   

## 2014-09-05 NOTE — Telephone Encounter (Signed)
Informed Myca @ [facility] that Dr. Claiborne Billings cannot sign prescription/ certificate of medical necessity for  Repair of his prosthesis.

## 2014-09-29 ENCOUNTER — Telehealth (HOSPITAL_COMMUNITY): Payer: Self-pay | Admitting: *Deleted

## 2014-09-29 NOTE — Telephone Encounter (Signed)
Called patient for 6 month aorta but he thinks it is not due until next year.  Please advise on when patient should have aorta done.

## 2014-09-29 NOTE — Telephone Encounter (Signed)
According to the last scan from November 2015 Dr. Gwenlyn Found recommended for it to be redone in 6 months. So it would be due now.

## 2014-11-06 ENCOUNTER — Other Ambulatory Visit: Payer: Self-pay | Admitting: Cardiovascular Disease

## 2014-11-06 NOTE — Telephone Encounter (Signed)
Rx(s) sent to pharmacy electronically.  

## 2014-11-07 ENCOUNTER — Encounter: Payer: Self-pay | Admitting: Cardiovascular Disease

## 2014-11-07 ENCOUNTER — Ambulatory Visit (INDEPENDENT_AMBULATORY_CARE_PROVIDER_SITE_OTHER): Payer: Medicare Other | Admitting: Cardiovascular Disease

## 2014-11-07 VITALS — BP 128/72 | HR 52 | Ht 67.0 in | Wt 131.7 lb

## 2014-11-07 DIAGNOSIS — E785 Hyperlipidemia, unspecified: Secondary | ICD-10-CM

## 2014-11-07 DIAGNOSIS — I10 Essential (primary) hypertension: Secondary | ICD-10-CM | POA: Diagnosis not present

## 2014-11-07 DIAGNOSIS — I719 Aortic aneurysm of unspecified site, without rupture: Secondary | ICD-10-CM | POA: Diagnosis not present

## 2014-11-07 DIAGNOSIS — Z79899 Other long term (current) drug therapy: Secondary | ICD-10-CM | POA: Diagnosis not present

## 2014-11-07 LAB — COMPREHENSIVE METABOLIC PANEL
ALBUMIN: 3.6 g/dL (ref 3.5–5.2)
ALT: 32 U/L (ref 0–53)
AST: 36 U/L (ref 0–37)
Alkaline Phosphatase: 74 U/L (ref 39–117)
BILIRUBIN TOTAL: 0.8 mg/dL (ref 0.2–1.2)
BUN: 19 mg/dL (ref 6–23)
CO2: 26 mEq/L (ref 19–32)
Calcium: 9.1 mg/dL (ref 8.4–10.5)
Chloride: 108 mEq/L (ref 96–112)
Creat: 0.9 mg/dL (ref 0.50–1.35)
Glucose, Bld: 82 mg/dL (ref 70–99)
Potassium: 4.6 mEq/L (ref 3.5–5.3)
SODIUM: 141 meq/L (ref 135–145)
Total Protein: 6.5 g/dL (ref 6.0–8.3)

## 2014-11-07 LAB — LIPID PANEL
Cholesterol: 124 mg/dL (ref 0–200)
HDL: 36 mg/dL — ABNORMAL LOW (ref >=40)
LDL CALC: 73 mg/dL (ref 0–99)
Total CHOL/HDL Ratio: 3.4 Ratio
Triglycerides: 77 mg/dL (ref <150)
VLDL: 15 mg/dL (ref 0–40)

## 2014-11-07 LAB — CBC
HEMATOCRIT: 38.9 % — AB (ref 39.0–52.0)
Hemoglobin: 13.4 g/dL (ref 13.0–17.0)
MCH: 34.2 pg — AB (ref 26.0–34.0)
MCHC: 34.4 g/dL (ref 30.0–36.0)
MCV: 99.2 fL (ref 78.0–100.0)
MPV: 10.8 fL (ref 8.6–12.4)
PLATELETS: 104 10*3/uL — AB (ref 150–400)
RBC: 3.92 MIL/uL — ABNORMAL LOW (ref 4.22–5.81)
RDW: 14.8 % (ref 11.5–15.5)
WBC: 4.4 10*3/uL (ref 4.0–10.5)

## 2014-11-07 LAB — TSH: TSH: 0.837 u[IU]/mL (ref 0.350–4.500)

## 2014-11-07 NOTE — Patient Instructions (Addendum)
Your physician recommends that you return for lab work fasting.  Your physician wants you to follow-up in: 6 months or sooner if needed. You will receive a reminder letter in the mail two months in advance. If you don't receive a letter, please call our office to schedule the follow-up appointment. 
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
## 2014-11-27 ENCOUNTER — Other Ambulatory Visit: Payer: Self-pay

## 2015-01-08 ENCOUNTER — Telehealth: Payer: Self-pay | Admitting: Cardiovascular Disease

## 2015-01-08 MED ORDER — NITROGLYCERIN 0.4 MG SL SUBL: 0.4000 mg | SUBLINGUAL_TABLET | SUBLINGUAL | Status: DC | PRN

## 2015-01-08 NOTE — Telephone Encounter (Signed)
Patient needs to have prescription for his NTG refilled.  Please call [facility] on [address].

## 2015-01-08 NOTE — Telephone Encounter (Signed)
Rx(s) sent to pharmacy electronically. Patient is not having chest pain, per wife Patient's wife notified of refill

## 2015-02-06 ENCOUNTER — Other Ambulatory Visit: Payer: Self-pay | Admitting: Cardiovascular Disease

## 2015-02-06 DIAGNOSIS — I714 Abdominal aortic aneurysm, without rupture, unspecified: Secondary | ICD-10-CM

## 2015-02-12 ENCOUNTER — Ambulatory Visit (HOSPITAL_COMMUNITY)
Admission: RE | Admit: 2015-02-12 | Discharge: 2015-02-12 | Disposition: A | Discharge status: Home / Self Care | Payer: Medicare Other | Source: Ambulatory Visit | Attending: Cardiology | Admitting: Cardiology

## 2015-02-12 DIAGNOSIS — I714 Abdominal aortic aneurysm, without rupture, unspecified: Secondary | ICD-10-CM

## 2015-03-13 DIAGNOSIS — Z23 Encounter for immunization: Secondary | ICD-10-CM | POA: Diagnosis not present

## 2015-04-24 ENCOUNTER — Encounter: Payer: Self-pay | Admitting: Family Medicine

## 2015-04-24 ENCOUNTER — Ambulatory Visit (INDEPENDENT_AMBULATORY_CARE_PROVIDER_SITE_OTHER): Payer: Medicare Other | Admitting: Family Medicine

## 2015-04-24 VITALS — BP 126/72 | HR 56 | Temp 97.7°F | Ht 68.0 in | Wt 134.5 lb

## 2015-04-24 DIAGNOSIS — Z89611 Acquired absence of right leg above knee: Secondary | ICD-10-CM

## 2015-04-24 DIAGNOSIS — E785 Hyperlipidemia, unspecified: Secondary | ICD-10-CM

## 2015-04-24 DIAGNOSIS — I255 Ischemic cardiomyopathy: Secondary | ICD-10-CM | POA: Diagnosis not present

## 2015-04-24 DIAGNOSIS — I714 Abdominal aortic aneurysm, without rupture, unspecified: Secondary | ICD-10-CM

## 2015-04-24 DIAGNOSIS — I10 Essential (primary) hypertension: Secondary | ICD-10-CM | POA: Diagnosis not present

## 2015-04-24 DIAGNOSIS — S065X9A Traumatic subdural hemorrhage with loss of consciousness of unspecified duration, initial encounter: Secondary | ICD-10-CM

## 2015-04-24 DIAGNOSIS — I6200 Nontraumatic subdural hemorrhage, unspecified: Secondary | ICD-10-CM

## 2015-04-24 DIAGNOSIS — S065XAA Traumatic subdural hemorrhage with loss of consciousness status unknown, initial encounter: Secondary | ICD-10-CM

## 2015-04-24 DIAGNOSIS — Z720 Tobacco use: Secondary | ICD-10-CM

## 2015-04-24 DIAGNOSIS — R2681 Unsteadiness on feet: Secondary | ICD-10-CM

## 2015-04-24 MED ORDER — ZOSTER VACCINE LIVE 19400 UNT/0.65ML ~~LOC~~ SOLR: 0.6500 mL | Freq: Once | SUBCUTANEOUS | Status: DC

## 2015-04-24 NOTE — Patient Instructions (Addendum)
Sign release for colonoscopy from 2009 (Dr Starr Sinclair) Increase water by 1 cup per day. Rx for shingles shot provided today. Rx for R leg provided today. Call your insurance about the shingles shot to see if it is covered or how much it would cost and where is cheaper (here or pharmacy).  If you want to receive here, call for nurse visit. RTC 6 months for medicare wellness visit      Mediterranean Diet  Why follow it? Research shows. . Those who follow the Mediterranean diet have a reduced risk of heart disease  . The diet is associated with a reduced incidence of Parkinson's and Alzheimer's diseases . People following the diet may have longer life expectancies and lower rates of chronic diseases  . The Dietary Guidelines for Americans recommends the Mediterranean diet as an eating plan to promote health and prevent disease  What Is the Mediterranean Diet?  . Healthy eating plan based on typical foods and recipes of Mediterranean-style cooking . The diet is primarily a plant based diet; these foods should make up a majority of meals   Starches - Plant based foods should make up a majority of meals - They are an important sources of vitamins, minerals, energy, antioxidants, and fiber - Choose whole grains, foods high in fiber and minimally processed items  - Typical grain sources include wheat, oats, barley, corn, brown rice, bulgar, farro, millet, polenta, couscous  - Various types of beans include chickpeas, lentils, fava beans, black beans, white beans   Fruits  Veggies - Large quantities of antioxidant rich fruits & veggies; 6 or more servings  - Vegetables can be eaten raw or lightly drizzled with oil and cooked  - Vegetables common to the traditional Mediterranean Diet include: artichokes, arugula, beets, broccoli, brussel sprouts, cabbage, carrots, celery, collard greens, cucumbers, eggplant, kale, leeks, lemons, lettuce, mushrooms, okra, onions, peas, peppers, potatoes, pumpkin,  radishes, rutabaga, shallots, spinach, sweet potatoes, turnips, zucchini - Fruits common to the Mediterranean Diet include: apples, apricots, avocados, cherries, clementines, dates, figs, grapefruits, grapes, melons, nectarines, oranges, peaches, pears, pomegranates, strawberries, tangerines  Fats - Replace butter and margarine with healthy oils, such as olive oil, canola oil, and tahini  - Limit nuts to no more than a handful a day  - Nuts include walnuts, almonds, pecans, pistachios, pine nuts  - Limit or avoid candied, honey roasted or heavily salted nuts - Olives are central to the [employer] - can be eaten whole or used in a variety of dishes   Meats Protein - Limiting red meat: no more than a few times a month - When eating red meat: choose lean cuts and keep the portion to the size of deck of cards - Eggs: approx. 0 to 4 times a week  - Fish and lean poultry: at least 2 a week  - Healthy protein sources include, chicken, [place], lean beef, lamb - Increase intake of seafood such as tuna, salmon, trout, mackerel, shrimp, scallops - Avoid or limit high fat processed meats such as sausage and bacon  Dairy - Include moderate amounts of low fat dairy products  - Focus on healthy dairy such as fat free yogurt, skim milk, low or reduced fat cheese - Limit dairy products higher in fat such as whole or 2% milk, cheese, ice cream  Alcohol - Moderate amounts of red wine is ok  - No more than 5 oz daily for women (all ages) and men older than age 73  - No  more than 10 oz of wine daily for men younger than 73  Other - Limit sweets and other desserts  - Use herbs and spices instead of salt to flavor foods  - Herbs and spices common to the traditional Mediterranean Diet include: basil, bay leaves, chives, cloves, cumin, fennel, garlic, lavender, marjoram, mint, oregano, parsley, pepper, rosemary, sage, savory, sumac, tarragon, thyme   It's not just a diet, it's a lifestyle:  . The  Mediterranean diet includes lifestyle factors typical of those in the region  . Foods, drinks and meals are best eaten with others and savored . Daily physical activity is important for overall good health . This could be strenuous exercise like running and aerobics . This could also be more leisurely activities such as walking, housework, yard-work, or taking the stairs . Moderation is the key; a balanced and healthy diet accommodates most foods and drinks . Consider portion sizes and frequency of consumption of certain foods   Meal Ideas & Options:  . Breakfast:  o Whole wheat toast or whole wheat English muffins with peanut butter & hard boiled egg o Steel cut oats topped with apples & cinnamon and skim milk  o Fresh fruit: banana, strawberries, melon, berries, peaches  o Smoothies: strawberries, bananas, greek yogurt, peanut butter o Low fat greek yogurt with blueberries and granola  o Egg white omelet with spinach and mushrooms o Breakfast couscous: whole wheat couscous, apricots, skim milk, cranberries  . Sandwiches:  o Hummus and grilled vegetables (peppers, zucchini, squash) on whole wheat bread   o Grilled chicken on whole wheat pita with lettuce, tomatoes, cucumbers or tzatziki  o Tuna salad on whole wheat bread: tuna salad made with greek yogurt, olives, red peppers, capers, green onions o Garlic rosemary lamb pita: lamb sauted with garlic, rosemary, salt & pepper; add lettuce, cucumber, greek yogurt to pita - flavor with lemon juice and black pepper  . Seafood:  o Mediterranean grilled salmon, seasoned with garlic, basil, parsley, lemon juice and black pepper o Shrimp, lemon, and spinach whole-grain pasta salad made with low fat greek yogurt  o Seared scallops with lemon orzo  o Seared tuna steaks seasoned salt, pepper, coriander topped with tomato mixture of olives, tomatoes, olive oil, minced garlic, parsley, green onions and cappers  . Meats:  o Herbed greek chicken salad  with kalamata olives, cucumber, feta  o Red bell peppers stuffed with spinach, bulgur, lean ground beef (or lentils) & topped with feta   o Kebabs: skewers of chicken, tomatoes, onions, zucchini, squash  o [place] burgers: made with red onions, mint, dill, lemon juice, feta cheese topped with roasted red peppers . Vegetarian o Cucumber salad: cucumbers, artichoke hearts, celery, red onion, feta cheese, tossed in olive oil & lemon juice  o Hummus and whole grain pita points with a greek salad (lettuce, tomato, feta, olives, cucumbers, red onion) o Lentil soup with celery, carrots made with vegetable broth, garlic, salt and pepper  o Tabouli salad: parsley, bulgur, mint, scallions, cucumbers, tomato, radishes, lemon juice, olive oil, salt and pepper.

## 2015-04-24 NOTE — Assessment & Plan Note (Signed)
Occasional cigar. ?

## 2015-04-24 NOTE — Assessment & Plan Note (Signed)
H/o this while on plavix, now only on aspirin.

## 2015-04-24 NOTE — Assessment & Plan Note (Addendum)
Overdue for renewing prosthetic, current one causing trouble leading to imbalance and labs. I asked him to have prosthetic supplier contact us for required paperwork

## 2015-04-24 NOTE — Assessment & Plan Note (Signed)
Followed by cards °

## 2015-04-24 NOTE — Progress Notes (Signed)
Pre visit review using our clinic review tool, if applicable. No additional management support is needed unless otherwise documented below in the visit note. 
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
## 2015-04-24 NOTE — Assessment & Plan Note (Addendum)
Chronic, stable. Continue statin and zetia I also recommended mediterranean diet.

## 2015-04-24 NOTE — Assessment & Plan Note (Signed)
Chronic, stable. Continue current regimen. 

## 2015-04-24 NOTE — Assessment & Plan Note (Signed)
Due to prosthethic.

## 2015-05-03 ENCOUNTER — Other Ambulatory Visit: Payer: Self-pay | Admitting: Cardiovascular Disease

## 2015-05-03 NOTE — Telephone Encounter (Signed)
REFILL 

## 2015-05-10 ENCOUNTER — Encounter: Payer: Self-pay | Admitting: Cardiovascular Disease

## 2015-05-10 ENCOUNTER — Ambulatory Visit (INDEPENDENT_AMBULATORY_CARE_PROVIDER_SITE_OTHER): Payer: Medicare Other | Admitting: Cardiovascular Disease

## 2015-05-10 ENCOUNTER — Telehealth: Payer: Self-pay | Admitting: Cardiovascular Disease

## 2015-05-10 VITALS — BP 180/90 | HR 57 | Ht 67.0 in | Wt 135.1 lb

## 2015-05-10 DIAGNOSIS — I714 Abdominal aortic aneurysm, without rupture, unspecified: Secondary | ICD-10-CM

## 2015-05-10 DIAGNOSIS — I2109 ST elevation (STEMI) myocardial infarction involving other coronary artery of anterior wall: Secondary | ICD-10-CM | POA: Diagnosis not present

## 2015-05-10 DIAGNOSIS — I255 Ischemic cardiomyopathy: Secondary | ICD-10-CM

## 2015-05-10 DIAGNOSIS — I10 Essential (primary) hypertension: Secondary | ICD-10-CM

## 2015-05-10 DIAGNOSIS — Z89611 Acquired absence of right leg above knee: Secondary | ICD-10-CM | POA: Diagnosis not present

## 2015-05-10 DIAGNOSIS — E785 Hyperlipidemia, unspecified: Secondary | ICD-10-CM

## 2015-05-10 MED ORDER — RAMIPRIL 10 MG PO CAPS: 10.0000 mg | ORAL_CAPSULE | Freq: Every day | ORAL | Status: DC

## 2015-05-10 NOTE — Telephone Encounter (Signed)
Returned call to patient. He saw Dr. Claiborne Billings today. His ramipril was reported as 5mg  daily but he was actually taking 5mg  BID. Dr. Claiborne Billings changed dose to 10mg  once daily.   Pt wants to make sure doesn't need to be increased further or new med added since this is same amount daily.  Routed for review.

## 2015-05-10 NOTE — Telephone Encounter (Signed)
Mr.Swiderski is calling because he is needing clarification on medication on Ramapril . Please call  Thanks

## 2015-05-10 NOTE — Patient Instructions (Signed)
Your physician has recommended you make the following change in your medication: the ramipril has been increased to 10 mg daily. A new prescription has been sent to your pharmacy.  Your physician recommends that you return for lab work in: 1 week.  Your physician wants you to follow-up in: 6 months or sooner if needed. You will receive a reminder letter in the mail two months in advance. If you don't receive a letter, please call our office to schedule the follow-up appointment.
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
## 2015-05-11 NOTE — Telephone Encounter (Signed)
Have him monitor BP at home; if consistently above [id number] systolic will need to add additiional Rx.

## 2015-05-11 NOTE — Telephone Encounter (Signed)
Pt advised on this and verbalized understanding.

## 2015-05-14 ENCOUNTER — Other Ambulatory Visit: Payer: Self-pay | Admitting: Cardiovascular Disease

## 2015-05-14 NOTE — Telephone Encounter (Signed)
Rx request sent to pharmacy.  

## 2015-05-18 DIAGNOSIS — I10 Essential (primary) hypertension: Secondary | ICD-10-CM | POA: Diagnosis not present

## 2015-05-19 LAB — BASIC METABOLIC PANEL
BUN: 19 mg/dL (ref 7–25)
CHLORIDE: 109 mmol/L (ref 98–110)
CO2: 26 mmol/L (ref 20–31)
Calcium: 8.7 mg/dL (ref 8.6–10.3)
Creat: 0.92 mg/dL (ref 0.70–1.18)
Glucose, Bld: 101 mg/dL — ABNORMAL HIGH (ref 65–99)
POTASSIUM: 4.2 mmol/L (ref 3.5–5.3)
Sodium: 142 mmol/L (ref 135–146)

## 2015-09-13 ENCOUNTER — Encounter: Payer: Self-pay | Admitting: Family Medicine

## 2015-09-13 ENCOUNTER — Ambulatory Visit (INDEPENDENT_AMBULATORY_CARE_PROVIDER_SITE_OTHER): Payer: Medicare Other | Admitting: Family Medicine

## 2015-09-13 ENCOUNTER — Other Ambulatory Visit: Payer: Self-pay | Admitting: Vascular Surgery

## 2015-09-13 ENCOUNTER — Ambulatory Visit (INDEPENDENT_AMBULATORY_CARE_PROVIDER_SITE_OTHER)
Admission: RE | Admit: 2015-09-13 | Discharge: 2015-09-13 | Disposition: A | Discharge status: Home / Self Care | Payer: Medicare Other | Source: Ambulatory Visit | Attending: Family Medicine | Admitting: Family Medicine

## 2015-09-13 VITALS — BP 126/62 | HR 68 | Temp 97.7°F

## 2015-09-13 DIAGNOSIS — Z89611 Acquired absence of right leg above knee: Secondary | ICD-10-CM | POA: Diagnosis not present

## 2015-09-13 DIAGNOSIS — M7989 Other specified soft tissue disorders: Secondary | ICD-10-CM

## 2015-09-13 DIAGNOSIS — M799 Soft tissue disorder, unspecified: Secondary | ICD-10-CM | POA: Diagnosis not present

## 2015-09-13 DIAGNOSIS — M79604 Pain in right leg: Secondary | ICD-10-CM | POA: Diagnosis not present

## 2015-09-13 DIAGNOSIS — S7011XA Contusion of right thigh, initial encounter: Secondary | ICD-10-CM | POA: Insufficient documentation

## 2015-09-13 NOTE — Assessment & Plan Note (Addendum)
Soft tissue mass at site of remote R AKA without known trauma in setting of new prosthesis (62 month old). Most likely hematoma but with unexplained mechanism of injury. Given mass palpated, check CT of stump to further eval for other cause of soft tissue mass. Overall improving. No signs of infection.

## 2015-09-13 NOTE — Patient Instructions (Addendum)
Schedule lab visit before your physical in May. I'd like to order CT scan of right extremity to further evaluate soft tissue swelling we have. Likely hematoma but CT will better be able to determine. Pass by Marion's office to get this scheduled. May use warm compresses. Keep leg elevated. If hematoma, I expect slow but full recovery.

## 2015-09-13 NOTE — Progress Notes (Signed)
Pre visit review using our clinic review tool, if applicable. No additional management support is needed unless otherwise documented below in the visit note. 

## 2015-09-13 NOTE — Progress Notes (Signed)
BP 126/62 mmHg  Pulse 68  Temp(Src) 97.7 F (36.5 C) (Oral)   CC: R stump swelling  Subjective:    Patient ID: Stephen [name], male    DOB: Aug 15, 1940, 75 y.o.   MRN: [id number]  HPI: Stephen [name] is a 75 y.o. male presenting on 09/13/2015 for Edema   H/o remote traumatic R AKA. Had prosthesis renewed late 05/2015 - no problems with it until 2 wks ago.   1 wk ago on Monday noticed small lump R lateral stump. Bruising ecchymosis spread throughout stump in subsequent days, however over last 1 week bruising/swelling has slowly improved. Discoloration is now 60% better. Lateral swelling/lump remains.   Denies fevers/chills, redness, discharge or skin opening. Denies inciting trauma/injury/fall.   Amputee for 50 yrs, this is first time he had similar symptoms.   States he's due for labs Noticing worsening lower back and left hip pain. Wonders if arthritis related. Notices more soreness after a game of golf.   Relevant past medical, surgical, family and social history reviewed and updated as indicated. Interim medical history since our last visit reviewed. Allergies and medications reviewed and updated. Current Outpatient Prescriptions on File Prior to Visit  Medication Sig  . aspirin EC 81 MG tablet Take 81 mg by mouth at bedtime.  [name] atorvastatin (LIPITOR) 20 MG tablet Take 10 mg by mouth daily.  . carvedilol (COREG) 12.5 MG tablet TAKE 1 TABLET (12.5 MG TOTAL) BY MOUTH 2 (TWO) TIMES DAILY WITH A MEAL.  . Multiple Vitamin (MULITIVITAMIN WITH MINERALS) TABS Take 1 tablet by mouth daily.  . nitroGLYCERIN (NITROSTAT) 0.4 MG SL tablet Place 1 tablet (0.4 mg total) under the tongue every 5 (five) minutes as needed for chest pain.  . ramipril (ALTACE) 10 MG capsule Take 1 capsule (10 mg total) by mouth daily.  [name] ZETIA 10 MG tablet TAKE 1 TABLET (10 MG TOTAL) BY MOUTH DAILY.  [name] zoster vaccine live, PF, (ZOSTAVAX) 29562 UNT/0.65ML injection Inject 19,400 Units into the skin once.  (Patient not taking: Reported on 09/13/2015)   No current facility-administered medications on file prior to visit.    Review of Systems Per HPI unless specifically indicated in ROS section     Objective:    BP 126/62 mmHg  Pulse 68  Temp(Src) 97.7 F (36.5 C) (Oral)  Wt Readings from Last 3 Encounters:  05/10/15 135 lb 1.6 oz (61.281 kg)  04/24/15 134 lb 8 oz (61.009 kg)  11/07/14 131 lb 11.2 oz (59.739 kg)    Physical Exam  Constitutional: He appears well-developed and well-nourished. No distress.  Musculoskeletal: He exhibits no edema.  AKA with ecchymosis at incision site of stump, no erythema or warmth. Large ~5x7 cm mass lateral R stump mildly tender to palpation, not fluctuant  Skin: Skin is warm and dry. Bruising and ecchymosis noted. No erythema.     Nursing note and vitals reviewed.     Assessment & Plan:   Problem List Items Addressed This Visit    Hx of AKA (above knee amputation) (HCC) (Chronic)   Relevant Orders   CT Femur Right Wo Contrast   Soft tissue mass - Primary    Soft tissue mass at site of remote R AKA without known trauma in setting of new prosthesis (37 month old). Most likely hematoma but with unexplained mechanism of injury. Given mass palpated, check CT of stump to further eval for other cause of soft tissue mass. Overall improving. No signs of infection.  Relevant Orders   CT Femur Right Wo Contrast    Other Visit Diagnoses    Right leg swelling        Relevant Orders    CT Femur Right Wo Contrast        Follow up plan: No Follow-up on file.  [name], MD

## 2015-09-21 ENCOUNTER — Other Ambulatory Visit: Payer: Medicare Other

## 2015-10-19 ENCOUNTER — Other Ambulatory Visit (INDEPENDENT_AMBULATORY_CARE_PROVIDER_SITE_OTHER): Payer: Medicare Other

## 2015-10-19 ENCOUNTER — Other Ambulatory Visit: Payer: Self-pay | Admitting: Family Medicine

## 2015-10-19 DIAGNOSIS — I10 Essential (primary) hypertension: Secondary | ICD-10-CM

## 2015-10-19 DIAGNOSIS — E785 Hyperlipidemia, unspecified: Secondary | ICD-10-CM | POA: Diagnosis not present

## 2015-10-19 DIAGNOSIS — D696 Thrombocytopenia, unspecified: Secondary | ICD-10-CM | POA: Diagnosis not present

## 2015-10-19 LAB — CBC WITH DIFFERENTIAL/PLATELET
BASOS ABS: 0 10*3/uL (ref 0.0–0.1)
Basophils Relative: 0.8 % (ref 0.0–3.0)
EOS ABS: 0.5 10*3/uL (ref 0.0–0.7)
Eosinophils Relative: 11.7 % — ABNORMAL HIGH (ref 0.0–5.0)
HCT: 37.2 % — ABNORMAL LOW (ref 39.0–52.0)
HEMOGLOBIN: 12.6 g/dL — AB (ref 13.0–17.0)
LYMPHS ABS: 1.4 10*3/uL (ref 0.7–4.0)
Lymphocytes Relative: 31.2 % (ref 12.0–46.0)
MCHC: 33.8 g/dL (ref 30.0–36.0)
MCV: 101.5 fl — ABNORMAL HIGH (ref 78.0–100.0)
MONO ABS: 0.3 10*3/uL (ref 0.1–1.0)
Monocytes Relative: 7.5 % (ref 3.0–12.0)
NEUTROS PCT: 48.8 % (ref 43.0–77.0)
Neutro Abs: 2.2 10*3/uL (ref 1.4–7.7)
Platelets: 99 10*3/uL — ABNORMAL LOW (ref 150.0–400.0)
RBC: 3.67 Mil/uL — AB (ref 4.22–5.81)
RDW: 16 % — ABNORMAL HIGH (ref 11.5–15.5)
WBC: 4.5 10*3/uL (ref 4.0–10.5)

## 2015-10-19 LAB — LIPID PANEL
CHOLESTEROL: 126 mg/dL (ref 0–200)
HDL: 26.4 mg/dL — ABNORMAL LOW (ref >39.00)
LDL Cholesterol: 77 mg/dL (ref 0–99)
NonHDL: 99.21
Total CHOL/HDL Ratio: 5
Triglycerides: 110 mg/dL (ref 0.0–149.0)
VLDL: 22 mg/dL (ref 0.0–40.0)

## 2015-10-19 LAB — COMPREHENSIVE METABOLIC PANEL
ALBUMIN: 3.5 g/dL (ref 3.5–5.2)
ALK PHOS: 73 U/L (ref 39–117)
ALT: 33 U/L (ref 0–53)
AST: 40 U/L — AB (ref 0–37)
BILIRUBIN TOTAL: 0.8 mg/dL (ref 0.2–1.2)
BUN: 19 mg/dL (ref 6–23)
CO2: 28 mEq/L (ref 19–32)
CREATININE: 1.01 mg/dL (ref 0.40–1.50)
Calcium: 8.9 mg/dL (ref 8.4–10.5)
Chloride: 112 mEq/L (ref 96–112)
GFR: 76.42 mL/min (ref >60.00)
Glucose, Bld: 86 mg/dL (ref 70–99)
POTASSIUM: 4.7 meq/L (ref 3.5–5.1)
SODIUM: 143 meq/L (ref 135–145)
TOTAL PROTEIN: 6.1 g/dL (ref 6.0–8.3)

## 2015-10-24 ENCOUNTER — Encounter: Payer: Self-pay | Admitting: Family Medicine

## 2015-10-24 ENCOUNTER — Ambulatory Visit (INDEPENDENT_AMBULATORY_CARE_PROVIDER_SITE_OTHER): Payer: Medicare Other | Admitting: Family Medicine

## 2015-10-24 VITALS — BP 114/58 | HR 64 | Temp 97.8°F | Ht 68.0 in | Wt 124.5 lb

## 2015-10-24 DIAGNOSIS — S7011XD Contusion of right thigh, subsequent encounter: Secondary | ICD-10-CM

## 2015-10-24 DIAGNOSIS — B182 Chronic viral hepatitis C: Secondary | ICD-10-CM | POA: Insufficient documentation

## 2015-10-24 DIAGNOSIS — K7460 Unspecified cirrhosis of liver: Secondary | ICD-10-CM

## 2015-10-24 DIAGNOSIS — Z7189 Other specified counseling: Secondary | ICD-10-CM | POA: Diagnosis not present

## 2015-10-24 DIAGNOSIS — E785 Hyperlipidemia, unspecified: Secondary | ICD-10-CM | POA: Diagnosis not present

## 2015-10-24 DIAGNOSIS — I714 Abdominal aortic aneurysm, without rupture, unspecified: Secondary | ICD-10-CM

## 2015-10-24 DIAGNOSIS — Z23 Encounter for immunization: Secondary | ICD-10-CM | POA: Diagnosis not present

## 2015-10-24 DIAGNOSIS — Z0000 Encounter for general adult medical examination without abnormal findings: Secondary | ICD-10-CM | POA: Diagnosis not present

## 2015-10-24 DIAGNOSIS — I255 Ischemic cardiomyopathy: Secondary | ICD-10-CM

## 2015-10-24 DIAGNOSIS — Z720 Tobacco use: Secondary | ICD-10-CM | POA: Diagnosis not present

## 2015-10-24 DIAGNOSIS — D696 Thrombocytopenia, unspecified: Secondary | ICD-10-CM

## 2015-10-24 DIAGNOSIS — B1920 Unspecified viral hepatitis C without hepatic coma: Secondary | ICD-10-CM

## 2015-10-24 NOTE — Patient Instructions (Addendum)
Sign release form up front for records from Dr Rex Kras and [facility] GI prevnar shot today. Let's give hematoma more time to heal. Update me in 1-2 months if ongoing trouble putting on prosthesis - we will start with return to prosthetic center. Pass by our referral coordinators to schedule abdominal ultrasound.  Bring me copy of your advanced directive to update your chart.  Good to see you today, call us with questions.   Health Maintenance, Male A healthy lifestyle and preventative care can promote health and wellness.  Maintain regular health, dental, and eye exams.  Eat a healthy diet. Foods like vegetables, fruits, whole grains, low-fat dairy products, and lean protein foods contain the nutrients you need and are low in calories. Decrease your intake of foods high in solid fats, added sugars, and salt. Get information about a proper diet from your health care provider, if necessary.  Regular physical exercise is one of the most important things you can do for your health. Most adults should get at least 150 minutes of moderate-intensity exercise (any activity that increases your heart rate and causes you to sweat) each week. In addition, most adults need muscle-strengthening exercises on 2 or more days a week.   Maintain a healthy weight. The body mass index (BMI) is a screening tool to identify possible weight problems. It provides an estimate of body fat based on height and weight. Your health care provider can find your BMI and can help you achieve or maintain a healthy weight. For males 20 years and older:  A BMI below 18.5 is considered underweight.  A BMI of 18.5 to 24.9 is normal.  A BMI of 25 to 29.9 is considered overweight.  A BMI of 30 and above is considered obese.  Maintain normal blood lipids and cholesterol by exercising and minimizing your intake of saturated fat. Eat a balanced diet with plenty of fruits and vegetables. Blood tests for lipids and cholesterol should begin  at age 66 and be repeated every 5 years. If your lipid or cholesterol levels are high, you are over age 57, or you are at high risk for heart disease, you may need your cholesterol levels checked more frequently.Ongoing high lipid and cholesterol levels should be treated with medicines if diet and exercise are not working.  If you smoke, find out from your health care provider how to quit. If you do not use tobacco, do not start.  Lung cancer screening is recommended for adults aged 49-80 years who are at high risk for developing lung cancer because of a history of smoking. A yearly low-dose CT scan of the lungs is recommended for people who have at least a 30-pack-year history of smoking and are current smokers or have quit within the past 15 years. A pack year of smoking is smoking an average of 1 pack of cigarettes a day for 1 year (for example, a 30-pack-year history of smoking could mean smoking 1 pack a day for 30 years or 2 packs a day for 15 years). Yearly screening should continue until the smoker has stopped smoking for at least 15 years. Yearly screening should be stopped for people who develop a health problem that would prevent them from having lung cancer treatment.  If you choose to drink alcohol, do not have more than 2 drinks per day. One drink is considered to be 12 oz (360 mL) of beer, 5 oz (150 mL) of wine, or 1.5 oz (45 mL) of liquor.  Avoid the use  of street drugs. Do not share needles with anyone. Ask for help if you need support or instructions about stopping the use of drugs.  High blood pressure causes heart disease and increases the risk of stroke. High blood pressure is more likely to develop in:  People who have blood pressure in the end of the normal range (100-139/85-89 mm Hg).  People who are overweight or obese.  People who are African American.  If you are 62-34 years of age, have your blood pressure checked every 3-5 years. If you are 63 years of age or older,  have your blood pressure checked every year. You should have your blood pressure measured twice--once when you are at a hospital or clinic, and once when you are not at a hospital or clinic. Record the average of the two measurements. To check your blood pressure when you are not at a hospital or clinic, you can use:  An automated blood pressure machine at a pharmacy.  A home blood pressure monitor.  If you are 51-29 years old, ask your health care provider if you should take aspirin to prevent heart disease.  Diabetes screening involves taking a blood sample to check your fasting blood sugar level. This should be done once every 3 years after age 77 if you are at a normal weight and without risk factors for diabetes. Testing should be considered at a younger age or be carried out more frequently if you are overweight and have at least 1 risk factor for diabetes.  Colorectal cancer can be detected and often prevented. Most routine colorectal cancer screening begins at the age of 36 and continues through age 32. However, your health care provider may recommend screening at an earlier age if you have risk factors for colon cancer. On a yearly basis, your health care provider may provide home test kits to check for hidden blood in the stool. A small camera at the end of a tube may be used to directly examine the colon (sigmoidoscopy or colonoscopy) to detect the earliest forms of colorectal cancer. Talk to your health care provider about this at age 53 when routine screening begins. A direct exam of the colon should be repeated every 5-10 years through age 66, unless early forms of precancerous polyps or small growths are found.  People who are at an increased risk for hepatitis B should be screened for this virus. You are considered at high risk for hepatitis B if:  You were born in a country where hepatitis B occurs often. Talk with your health care provider about which countries are considered high  risk.  Your parents were born in a high-risk country and you have not received a shot to protect against hepatitis B (hepatitis B vaccine).  You have HIV or AIDS.  You use needles to inject street drugs.  You live with, or have sex with, someone who has hepatitis B.  You are a man who has sex with other men (MSM).  You get hemodialysis treatment.  You take certain medicines for conditions like cancer, organ transplantation, and autoimmune conditions.  Hepatitis C blood testing is recommended for all people born from [birth year] through 1966 and any individual with known risk factors for hepatitis C.  Healthy men should no longer receive prostate-specific antigen (PSA) blood tests as part of routine cancer screening. Talk to your health care provider about prostate cancer screening.  Testicular cancer screening is not recommended for adolescents or adult males who have no symptoms.  Screening includes self-exam, a health care provider exam, and other screening tests. Consult with your health care provider about any symptoms you have or any concerns you have about testicular cancer.  Practice safe sex. Use condoms and avoid high-risk sexual practices to reduce the spread of sexually transmitted infections (STIs).  You should be screened for STIs, including gonorrhea and chlamydia if:  You are sexually active and are younger than 24 years.  You are older than 24 years, and your health care provider tells you that you are at risk for this type of infection.  Your sexual activity has changed since you were last screened, and you are at an increased risk for chlamydia or gonorrhea. Ask your health care provider if you are at risk.  If you are at risk of being infected with HIV, it is recommended that you take a prescription medicine daily to prevent HIV infection. This is called pre-exposure prophylaxis (PrEP). You are considered at risk if:  You are a man who has sex with other men (MSM).  You  are a heterosexual man who is sexually active with multiple partners.  You take drugs by injection.  You are sexually active with a partner who has HIV.  Talk with your health care provider about whether you are at high risk of being infected with HIV. If you choose to begin PrEP, you should first be tested for HIV. You should then be tested every 3 months for as long as you are taking PrEP.  Use sunscreen. Apply sunscreen liberally and repeatedly throughout the day. You should seek shade when your shadow is shorter than you. Protect yourself by wearing long sleeves, pants, a wide-brimmed hat, and sunglasses year round whenever you are outdoors.  Tell your health care provider of new moles or changes in moles, especially if there is a change in shape or color. Also, tell your health care provider if a mole is larger than the size of a pencil eraser.  A one-time screening for abdominal aortic aneurysm (AAA) and surgical repair of large AAAs by ultrasound is recommended for men aged 79-75 years who are current or former smokers.  Stay current with your vaccines (immunizations).   This information is not intended to replace advice given to you by your health care provider. Make sure you discuss any questions you have with your health care provider.   Document Released: 11/15/2007 Document Revised: 06/09/2014 Document Reviewed: 10/14/2010 Elsevier Interactive Patient Education [employer].

## 2015-10-24 NOTE — Assessment & Plan Note (Signed)

## 2015-10-24 NOTE — Assessment & Plan Note (Signed)
Monitored yearly by cards. Last check 02/2015 stable.

## 2015-10-24 NOTE — Progress Notes (Signed)
Pre visit review using our clinic review tool, if applicable. No additional management support is needed unless otherwise documented below in the visit note. 

## 2015-10-24 NOTE — Assessment & Plan Note (Signed)
Advanced directive discussion - has at home. HCPOA would be wife. Will bring me copy to update chart. 

## 2015-10-24 NOTE — Assessment & Plan Note (Signed)
Chronic. Followed by cards. Denies anginal sxs.

## 2015-10-24 NOTE — Assessment & Plan Note (Signed)
Chronic, stable except for low LDL. Continue atorvastatin and zetia.

## 2015-10-24 NOTE — Assessment & Plan Note (Signed)
Ongoing trouble - with ongoing small bruising.  plt found to be lower - see below.  Hematoma by CT actually smaller today. Will give 1-2 more months, pt will update me if not improving each week for return to prosthetic center or ortho.

## 2015-10-24 NOTE — Addendum Note (Signed)
Addended by: Royann Shivers A on: 10/24/2015 09:50 AM   Modules accepted: Orders
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
## 2015-10-24 NOTE — Assessment & Plan Note (Signed)
Rare cigar.  

## 2015-10-24 NOTE — Assessment & Plan Note (Signed)
H/o remote hepatitis C infection, with mild transaminitis since, and now worsening thrombocytopenia - ?related to hematoma of R thigh AKA site. Check abd [place] today. Discussed possible hep C clinic eval.

## 2015-10-27 ENCOUNTER — Encounter: Payer: Self-pay | Admitting: Family Medicine

## 2015-10-27 DIAGNOSIS — N402 Nodular prostate without lower urinary tract symptoms: Secondary | ICD-10-CM | POA: Insufficient documentation

## 2015-10-31 ENCOUNTER — Ambulatory Visit
Admission: RE | Admit: 2015-10-31 | Discharge: 2015-10-31 | Disposition: A | Discharge status: Home / Self Care | Payer: Medicare Other | Source: Ambulatory Visit | Attending: Family Medicine | Admitting: Family Medicine

## 2015-10-31 DIAGNOSIS — B1920 Unspecified viral hepatitis C without hepatic coma: Secondary | ICD-10-CM

## 2015-10-31 DIAGNOSIS — R7989 Other specified abnormal findings of blood chemistry: Secondary | ICD-10-CM | POA: Diagnosis not present

## 2015-11-13 ENCOUNTER — Other Ambulatory Visit: Payer: Self-pay | Admitting: Cardiology

## 2015-11-13 ENCOUNTER — Other Ambulatory Visit: Payer: Self-pay | Admitting: Cardiovascular Disease

## 2015-11-17 ENCOUNTER — Encounter: Payer: Self-pay | Admitting: Family Medicine

## 2016-01-17 ENCOUNTER — Other Ambulatory Visit: Payer: Self-pay | Admitting: Cardiovascular Disease

## 2016-01-17 NOTE — Telephone Encounter (Signed)
REFILL 

## 2016-02-12 ENCOUNTER — Ambulatory Visit (INDEPENDENT_AMBULATORY_CARE_PROVIDER_SITE_OTHER): Payer: Medicare Other | Admitting: Cardiovascular Disease

## 2016-02-12 ENCOUNTER — Encounter: Payer: Self-pay | Admitting: Cardiovascular Disease

## 2016-02-12 VITALS — BP 134/74 | HR 57 | Ht 68.0 in | Wt 131.0 lb

## 2016-02-12 DIAGNOSIS — I10 Essential (primary) hypertension: Secondary | ICD-10-CM | POA: Diagnosis not present

## 2016-02-12 DIAGNOSIS — I2510 Atherosclerotic heart disease of native coronary artery without angina pectoris: Secondary | ICD-10-CM

## 2016-02-12 DIAGNOSIS — E785 Hyperlipidemia, unspecified: Secondary | ICD-10-CM | POA: Diagnosis not present

## 2016-02-12 DIAGNOSIS — I714 Abdominal aortic aneurysm, without rupture, unspecified: Secondary | ICD-10-CM

## 2016-02-12 DIAGNOSIS — I255 Ischemic cardiomyopathy: Secondary | ICD-10-CM
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
## 2016-02-12 NOTE — Patient Instructions (Signed)
Your physician wants you to follow-up in: 6 MONTHS WITH DR KELLY You will receive a reminder letter in the mail two months in advance. If you don't receive a letter, please call our office to schedule the follow-up appointment.   If you need a refill on your cardiac medications before your next appointment, please call your pharmacy.  

## 2016-03-11 DIAGNOSIS — Z23 Encounter for immunization: Secondary | ICD-10-CM | POA: Diagnosis not present

## 2016-04-20 ENCOUNTER — Other Ambulatory Visit: Payer: Self-pay | Admitting: Cardiovascular Disease

## 2016-04-28 ENCOUNTER — Ambulatory Visit (INDEPENDENT_AMBULATORY_CARE_PROVIDER_SITE_OTHER): Payer: Medicare Other | Admitting: Family Medicine

## 2016-04-28 ENCOUNTER — Encounter: Payer: Self-pay | Admitting: Family Medicine

## 2016-04-28 VITALS — BP 118/76 | HR 56 | Temp 97.6°F | Wt 136.0 lb

## 2016-04-28 DIAGNOSIS — D696 Thrombocytopenia, unspecified: Secondary | ICD-10-CM | POA: Diagnosis not present

## 2016-04-28 DIAGNOSIS — B182 Chronic viral hepatitis C: Secondary | ICD-10-CM

## 2016-04-28 DIAGNOSIS — I255 Ischemic cardiomyopathy: Secondary | ICD-10-CM | POA: Diagnosis not present

## 2016-04-28 DIAGNOSIS — R5383 Other fatigue: Secondary | ICD-10-CM | POA: Diagnosis not present

## 2016-04-28 DIAGNOSIS — R2681 Unsteadiness on feet: Secondary | ICD-10-CM | POA: Diagnosis not present

## 2016-04-28 DIAGNOSIS — Z89611 Acquired absence of right leg above knee: Secondary | ICD-10-CM

## 2016-04-28 DIAGNOSIS — S7011XD Contusion of right thigh, subsequent encounter: Secondary | ICD-10-CM

## 2016-04-28 DIAGNOSIS — R531 Weakness: Secondary | ICD-10-CM | POA: Insufficient documentation

## 2016-04-28 LAB — COMPREHENSIVE METABOLIC PANEL
ALBUMIN: 3.6 g/dL (ref 3.5–5.2)
ALT: 42 U/L (ref 0–53)
AST: 45 U/L — AB (ref 0–37)
Alkaline Phosphatase: 69 U/L (ref 39–117)
BILIRUBIN TOTAL: 0.8 mg/dL (ref 0.2–1.2)
BUN: 20 mg/dL (ref 6–23)
CALCIUM: 9 mg/dL (ref 8.4–10.5)
CHLORIDE: 110 meq/L (ref 96–112)
CO2: 27 meq/L (ref 19–32)
CREATININE: 1.09 mg/dL (ref 0.40–1.50)
GFR: 69.89 mL/min (ref >60.00)
Glucose, Bld: 90 mg/dL (ref 70–99)
Potassium: 4.4 mEq/L (ref 3.5–5.1)
SODIUM: 143 meq/L (ref 135–145)
Total Protein: 6.6 g/dL (ref 6.0–8.3)

## 2016-04-28 LAB — CBC WITH DIFFERENTIAL/PLATELET
BASOS PCT: 0.5 % (ref 0.0–3.0)
Basophils Absolute: 0 10*3/uL (ref 0.0–0.1)
EOS ABS: 0.2 10*3/uL (ref 0.0–0.7)
EOS PCT: 5.3 % — AB (ref 0.0–5.0)
HCT: 39.9 % (ref 39.0–52.0)
Hemoglobin: 13.5 g/dL (ref 13.0–17.0)
LYMPHS ABS: 1.4 10*3/uL (ref 0.7–4.0)
Lymphocytes Relative: 31.7 % (ref 12.0–46.0)
MCHC: 33.9 g/dL (ref 30.0–36.0)
MCV: 102 fl — ABNORMAL HIGH (ref 78.0–100.0)
MONO ABS: 0.4 10*3/uL (ref 0.1–1.0)
Monocytes Relative: 8.8 % (ref 3.0–12.0)
NEUTROS PCT: 53.7 % (ref 43.0–77.0)
Neutro Abs: 2.3 10*3/uL (ref 1.4–7.7)
Platelets: 91 10*3/uL — ABNORMAL LOW (ref 150.0–400.0)
RBC: 3.91 Mil/uL — ABNORMAL LOW (ref 4.22–5.81)
RDW: 15.3 % (ref 11.5–15.5)
WBC: 4.3 10*3/uL (ref 4.0–10.5)

## 2016-04-28 LAB — VITAMIN B12: VITAMIN B 12: 387 pg/mL (ref 211–911)

## 2016-04-28 LAB — TSH: TSH: 0.63 u[IU]/mL (ref 0.35–4.50)

## 2016-04-28 NOTE — Assessment & Plan Note (Signed)
Update CBC. 
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
## 2016-04-28 NOTE — Assessment & Plan Note (Signed)
Unclear cause, has now resolved. Recheck plt count today.

## 2016-04-28 NOTE — Assessment & Plan Note (Signed)
Ongoing, AKA contributes. Endorsing increased sense of imbalance as well. Will check B12, TSH, CBC, CMP for further evaluation.

## 2016-04-28 NOTE — Assessment & Plan Note (Addendum)
H/o remote hepatitis C infection. Update CMP, CBC, acute hepatitis panel as well as hep C RNA quant with reflex to genotype. Discussed possible referral to hepatology clinic if needed.

## 2016-04-28 NOTE — Progress Notes (Signed)
Pre visit review using our clinic review tool, if applicable. No additional management support is needed unless otherwise documented below in the visit note. 

## 2016-04-28 NOTE — Assessment & Plan Note (Signed)
Check for reversible causes of fatigue.

## 2016-04-28 NOTE — Patient Instructions (Signed)
Labs today.  You are doing well today Return as needed or in 6 months for next wellness visit

## 2016-04-29 LAB — HEPATITIS PANEL, ACUTE
HCV Ab: REACTIVE — AB
Hep A IgM: NONREACTIVE
Hep B C IgM: NONREACTIVE
Hepatitis B Surface Ag: NEGATIVE

## 2016-04-30 LAB — HCV RNA QUANT RFLX ULTRA OR GENOTYP
HCV QUANT: 474719 [IU]/mL — AB (ref <15)
HCV Quantitative Log: 5.68 {Log} — ABNORMAL HIGH (ref <1.18)

## 2016-05-02 LAB — HEPATITIS C RNA QUANTITATIVE

## 2016-05-03 ENCOUNTER — Other Ambulatory Visit: Payer: Self-pay | Admitting: Family Medicine

## 2016-05-03 DIAGNOSIS — B182 Chronic viral hepatitis C: Secondary | ICD-10-CM

## 2016-05-03 DIAGNOSIS — D696 Thrombocytopenia, unspecified: Secondary | ICD-10-CM

## 2016-05-03 LAB — HEPATITIS C GENOTYPE

## 2016-05-14 ENCOUNTER — Other Ambulatory Visit (HOSPITAL_COMMUNITY): Payer: Self-pay | Admitting: Nurse Practitioner

## 2016-05-14 DIAGNOSIS — B182 Chronic viral hepatitis C: Secondary | ICD-10-CM

## 2016-05-19 ENCOUNTER — Other Ambulatory Visit: Payer: Self-pay | Admitting: Cardiovascular Disease

## 2016-05-29 ENCOUNTER — Ambulatory Visit (HOSPITAL_COMMUNITY)
Admission: RE | Admit: 2016-05-29 | Discharge: 2016-05-29 | Disposition: A | Discharge status: Home / Self Care | Payer: Medicare Other | Source: Ambulatory Visit | Attending: Nurse Practitioner | Admitting: Nurse Practitioner

## 2016-05-29 DIAGNOSIS — N281 Cyst of kidney, acquired: Secondary | ICD-10-CM | POA: Diagnosis not present

## 2016-05-29 DIAGNOSIS — B182 Chronic viral hepatitis C: Secondary | ICD-10-CM | POA: Diagnosis not present

## 2016-07-22 DIAGNOSIS — B182 Chronic viral hepatitis C: Secondary | ICD-10-CM | POA: Diagnosis not present

## 2016-07-22 DIAGNOSIS — K7460 Unspecified cirrhosis of liver: Secondary | ICD-10-CM | POA: Diagnosis not present

## 2016-07-25 ENCOUNTER — Encounter: Payer: Self-pay | Admitting: Physician Assistant

## 2016-07-26 ENCOUNTER — Encounter: Payer: Self-pay | Admitting: Family Medicine

## 2016-08-05 ENCOUNTER — Encounter: Payer: Self-pay | Admitting: Physician Assistant

## 2016-08-05 ENCOUNTER — Ambulatory Visit (INDEPENDENT_AMBULATORY_CARE_PROVIDER_SITE_OTHER): Payer: Medicare Other | Admitting: Physician Assistant

## 2016-08-05 VITALS — BP 130/80 | HR 72 | Ht 66.5 in | Wt 135.4 lb

## 2016-08-05 DIAGNOSIS — Z8619 Personal history of other infectious and parasitic diseases: Secondary | ICD-10-CM | POA: Diagnosis not present

## 2016-08-05 DIAGNOSIS — Z1211 Encounter for screening for malignant neoplasm of colon: Secondary | ICD-10-CM

## 2016-08-05 DIAGNOSIS — Z1212 Encounter for screening for malignant neoplasm of rectum: Secondary | ICD-10-CM | POA: Diagnosis not present
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
## 2016-08-05 NOTE — Patient Instructions (Addendum)
If you are age 77 or older, your body mass index should be between 23-30. Your Body mass index is 21.52 kg/m. If this is out of the aforementioned range listed, please consider follow up with your Primary Care Provider.  If you are age 88 or younger, your body mass index should be between 19-25. Your Body mass index is 21.52 kg/m. If this is out of the aformentioned range listed, please consider follow up with your Primary Care Provider.   You have been scheduled for an endoscopy. Please follow written instructions given to you at your visit today. If you use inhalers (even only as needed), please bring them with you on the day of your procedure. Your physician has requested that you go to www.startemmi.com and enter the access code given to you at your visit today. This web site gives a general overview about your procedure. However, you should still follow specific instructions given to you by our office regarding your preparation for the procedure.  Please contact us after your appointment with your cardiologist.  Thank you.

## 2016-08-05 NOTE — Progress Notes (Signed)
Assessment and plans noted ?

## 2016-08-11 ENCOUNTER — Other Ambulatory Visit: Payer: Self-pay | Admitting: Cardiovascular Disease

## 2016-08-12 NOTE — Telephone Encounter (Signed)
Rx(s) sent to pharmacy electronically.  

## 2016-08-15 ENCOUNTER — Ambulatory Visit (INDEPENDENT_AMBULATORY_CARE_PROVIDER_SITE_OTHER): Payer: Medicare Other | Admitting: Cardiovascular Disease

## 2016-08-15 ENCOUNTER — Encounter: Payer: Self-pay | Admitting: Cardiovascular Disease

## 2016-08-15 VITALS — BP 128/80 | HR 60 | Ht 66.5 in | Wt 136.0 lb

## 2016-08-15 DIAGNOSIS — Z79899 Other long term (current) drug therapy: Secondary | ICD-10-CM

## 2016-08-15 DIAGNOSIS — R5383 Other fatigue: Secondary | ICD-10-CM | POA: Diagnosis not present

## 2016-08-15 DIAGNOSIS — I10 Essential (primary) hypertension: Secondary | ICD-10-CM | POA: Diagnosis not present

## 2016-08-15 DIAGNOSIS — I714 Abdominal aortic aneurysm, without rupture, unspecified: Secondary | ICD-10-CM

## 2016-08-15 DIAGNOSIS — I255 Ischemic cardiomyopathy: Secondary | ICD-10-CM | POA: Diagnosis not present

## 2016-08-15 DIAGNOSIS — Z89611 Acquired absence of right leg above knee: Secondary | ICD-10-CM

## 2016-08-15 DIAGNOSIS — E785 Hyperlipidemia, unspecified: Secondary | ICD-10-CM | POA: Diagnosis not present

## 2016-08-15 DIAGNOSIS — Z955 Presence of coronary angioplasty implant and graft: Secondary | ICD-10-CM | POA: Diagnosis not present

## 2016-08-15 DIAGNOSIS — B182 Chronic viral hepatitis C: Secondary | ICD-10-CM

## 2016-08-15 DIAGNOSIS — I2589 Other forms of chronic ischemic heart disease: Secondary | ICD-10-CM | POA: Diagnosis not present

## 2016-08-15 LAB — LIPID PANEL
CHOLESTEROL: 133 mg/dL (ref <200)
HDL: 37 mg/dL — ABNORMAL LOW (ref >40)
LDL Cholesterol: 76 mg/dL (ref <100)
Total CHOL/HDL Ratio: 3.6 Ratio (ref <5.0)
Triglycerides: 99 mg/dL (ref <150)
VLDL: 20 mg/dL (ref <30)

## 2016-08-15 LAB — COMPREHENSIVE METABOLIC PANEL
ALBUMIN: 3.4 g/dL — AB (ref 3.6–5.1)
ALK PHOS: 73 U/L (ref 40–115)
ALT: 13 U/L (ref 9–46)
AST: 18 U/L (ref 10–35)
BILIRUBIN TOTAL: 0.7 mg/dL (ref 0.2–1.2)
BUN: 20 mg/dL (ref 7–25)
CALCIUM: 8.7 mg/dL (ref 8.6–10.3)
CO2: 25 mmol/L (ref 20–31)
Chloride: 108 mmol/L (ref 98–110)
Creat: 1.12 mg/dL (ref 0.70–1.18)
Glucose, Bld: 78 mg/dL (ref 65–99)
POTASSIUM: 4.4 mmol/L (ref 3.5–5.3)
Sodium: 140 mmol/L (ref 135–146)
Total Protein: 6.1 g/dL (ref 6.1–8.1)

## 2016-08-15 LAB — CBC
HEMATOCRIT: 39.2 % (ref 38.5–50.0)
HEMOGLOBIN: 13.2 g/dL (ref 13.2–17.1)
MCH: 34.3 pg — ABNORMAL HIGH (ref 27.0–33.0)
MCHC: 33.7 g/dL (ref 32.0–36.0)
MCV: 101.8 fL — AB (ref 80.0–100.0)
MPV: 10.3 fL (ref 7.5–12.5)
Platelets: 100 10*3/uL — ABNORMAL LOW (ref 140–400)
RBC: 3.85 MIL/uL — ABNORMAL LOW (ref 4.20–5.80)
RDW: 15.2 % — AB (ref 11.0–15.0)
WBC: 5.4 10*3/uL (ref 3.8–10.8)

## 2016-08-15 NOTE — Patient Instructions (Addendum)
Your physician wants you to follow-up in: 6 months or sooner if needed. You will receive a reminder letter in the mail two months in advance. If you don't receive a letter, please call our office to schedule the follow-up appointment.  Your physician recommends that you return for lab work today.  If you need a refill on your cardiac medications before your next appointment, please call your pharmacy.  Your physician has requested that you have an abdominal aorta duplex in 6 months. During this test, an ultrasound is used to evaluate the aorta.
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
## 2016-08-16 LAB — VITAMIN B12: Vitamin B-12: 351 pg/mL (ref 200–1100)

## 2016-08-16 LAB — FOLATE: Folate: 7.6 ng/mL (ref >5.4)

## 2016-08-18 ENCOUNTER — Telehealth: Payer: Self-pay | Admitting: Cardiovascular Disease

## 2016-08-18 NOTE — Telephone Encounter (Signed)
Per last OV note: Your physician has requested that you have an abdominal aorta duplex in 6 months. During this test, an ultrasound is used to evaluate the aorta. Your physician wants you to follow-up in: 6 months or sooner if needed.  Pt notified he does not need AA duplex until July. I will cancel and have them call to re-schedule this in September. AAA appt cancelled-per scheduler-Cancel [id number] ( NOT DUE UNTIL AUG. PT WILL CB TO [place] LATER)

## 2016-08-18 NOTE — Telephone Encounter (Signed)
New Message  Pt call requesting to speak with RN about test schedule for 4/13.  Pt states he was told it was to be scheduled for 6 months out. Please call back to discuss

## 2016-08-31 HISTORY — PX: ESOPHAGOGASTRODUODENOSCOPY: SHX1529

## 2016-09-05 DIAGNOSIS — B182 Chronic viral hepatitis C: Secondary | ICD-10-CM | POA: Diagnosis not present

## 2016-09-12 ENCOUNTER — Other Ambulatory Visit (HOSPITAL_COMMUNITY): Payer: Medicare Other

## 2016-09-24 ENCOUNTER — Ambulatory Visit (AMBULATORY_SURGERY_CENTER): Payer: Medicare Other | Admitting: Internal Medicine

## 2016-09-24 ENCOUNTER — Encounter: Payer: Self-pay | Admitting: Internal Medicine

## 2016-09-24 VITALS — BP 144/68 | HR 50 | Temp 98.6°F | Resp 16 | Ht 66.0 in | Wt 135.0 lb

## 2016-09-24 DIAGNOSIS — K766 Portal hypertension: Secondary | ICD-10-CM

## 2016-09-24 DIAGNOSIS — K3189 Other diseases of stomach and duodenum: Secondary | ICD-10-CM | POA: Diagnosis not present

## 2016-09-24 DIAGNOSIS — K7460 Unspecified cirrhosis of liver: Secondary | ICD-10-CM | POA: Diagnosis not present

## 2016-09-24 DIAGNOSIS — I2510 Atherosclerotic heart disease of native coronary artery without angina pectoris: Secondary | ICD-10-CM | POA: Diagnosis not present

## 2016-09-24 DIAGNOSIS — Z8619 Personal history of other infectious and parasitic diseases: Secondary | ICD-10-CM | POA: Diagnosis present

## 2016-09-24 MED ORDER — SODIUM CHLORIDE 0.9 % IV SOLN: 500.0000 mL | INTRAVENOUS | Status: DC

## 2016-09-24 NOTE — Progress Notes (Signed)
Dental advisory given to patient 

## 2016-09-24 NOTE — Op Note (Signed)
[place] Patient Name: Stephen [name] Procedure Date: 09/24/2016 10:57 AM MRN: [id number] Endoscopist: Docia Chuck. Henrene Pastor , MD Age: 76 Referring MD:  Date of Birth: [birth year] Gender: Male Account #: [id number] Procedure:                Upper GI endoscopy Indications:              Cirrhosis rule out esophageal varices Medicines:                Monitored Anesthesia Care Procedure:                Pre-Anesthesia Assessment:                           - Prior to the procedure, a History and Physical                            was performed, and patient medications and                            allergies were reviewed. The patient's tolerance of                            previous anesthesia was also reviewed. The risks                            and benefits of the procedure and the sedation                            options and risks were discussed with the patient.                            All questions were answered, and informed consent                            was obtained. Prior Anticoagulants: The patient has                            taken no previous anticoagulant or antiplatelet                            agents. ASA Grade Assessment: III - A patient with                            severe systemic disease. After reviewing the risks                            and benefits, the patient was deemed in                            satisfactory condition to undergo the procedure.                           After obtaining informed consent, the endoscope was  passed under direct vision. Throughout the                            procedure, the patient's blood pressure, pulse, and                            oxygen saturations were monitored continuously. The                            Endoscope was introduced through the mouth, and                            advanced to the second part of duodenum. The upper                            GI endoscopy  was accomplished without difficulty.                            The patient tolerated the procedure well. Scope In: Scope Out: Findings:                 The esophagus was normal.                           Very Mild portal hypertensive gastropathy was found                            in the gastric body.                           The stomach was normal.                           The examined duodenum was normal.                           The cardia and gastric fundus were normal on                            retroflexion. Complications:            No immediate complications. Estimated Blood Loss:     Estimated blood loss: none. Impression:               - Normal esophagus.                           - Very mild portal hypertensive gastropathy.                           - Normal stomach.                           - Normal examined duodenum.                           - No specimens collected. Recommendation:           - Patient  has a contact number available for                            emergencies. The signs and symptoms of potential                            delayed complications were discussed with the                            patient. Return to normal activities tomorrow.                            Written discharge instructions were provided to the                            patient.                           - Resume previous diet.                           - Continue present medications.                           - Return to your PCP and hepatologist Docia Chuck. Henrene Pastor, MD 09/24/2016 11:23:40 AM This report has been signed electronically. CC Letter to:             Charlaine Dalton, NP

## 2016-09-24 NOTE — Patient Instructions (Signed)
YOU HAD AN ENDOSCOPIC PROCEDURE TODAY AT THE  ENDOSCOPY CENTER:   Refer to the procedure report that was given to you for any specific questions about what was found during the examination.  If the procedure report does not answer your questions, please call your gastroenterologist to clarify.  If you requested that your care partner not be given the details of your procedure findings, then the procedure report has been included in a sealed envelope for you to review at your convenience later.  YOU SHOULD EXPECT: Some feelings of bloating in the abdomen. Passage of more gas than usual.  Walking can help get rid of the air that was put into your GI tract during the procedure and reduce the bloating. If you had a lower endoscopy (such as a colonoscopy or flexible sigmoidoscopy) you may notice spotting of blood in your stool or on the toilet paper. If you underwent a bowel prep for your procedure, you may not have a normal bowel movement for a few days.  Please Note:  You might notice some irritation and congestion in your nose or some drainage.  This is from the oxygen used during your procedure.  There is no need for concern and it should clear up in a day or so.  SYMPTOMS TO REPORT IMMEDIATELY:   Following upper endoscopy (EGD)  Vomiting of blood or coffee ground material  New chest pain or pain under the shoulder blades  Painful or persistently difficult swallowing  New shortness of breath  Fever of 100F or higher  Black, tarry-looking stools  For urgent or emergent issues, a gastroenterologist can be reached at any hour by calling [phone].   DIET:  We do recommend a small meal at first, but then you may proceed to your regular diet.  Drink plenty of fluids but you should avoid alcoholic beverages for 24 hours.  ACTIVITY:  You should plan to take it easy for the rest of today and you should NOT DRIVE or use heavy machinery until tomorrow (because of the sedation medicines used  during the test).    FOLLOW UP: Our staff will call the number listed on your records the next business day following your procedure to check on you and address any questions or concerns that you may have regarding the information given to you following your procedure. If we do not reach you, we will leave a message.  However, if you are feeling well and you are not experiencing any problems, there is no need to return our call.  We will assume that you have returned to your regular daily activities without incident.  If any biopsies were taken you will be contacted by phone or by letter within the next 1-3 weeks.  Please call us at [phone] if you have not heard about the biopsies in 3 weeks.    SIGNATURES/CONFIDENTIALITY: You and/or your care partner have signed paperwork which will be entered into your electronic medical record.  These signatures attest to the fact that that the information above on your After Visit Summary has been reviewed and is understood.  Full responsibility of the confidentiality of this discharge information lies with you and/or your care-partner. 

## 2016-09-24 NOTE — Progress Notes (Signed)
A/ox3 pleased with MAC, report to Karen RN 

## 2016-09-25 ENCOUNTER — Telehealth: Payer: Self-pay | Admitting: *Deleted

## 2016-09-25 ENCOUNTER — Encounter: Payer: Self-pay | Admitting: Family Medicine

## 2016-09-25 NOTE — Telephone Encounter (Signed)
  Follow up Call-  Call back number 09/24/2016  Post procedure Call Back phone  # [phone]  Permission to leave phone message Yes  Some recent data might be hidden     Patient questions:  Do you have a fever, pain , or abdominal swelling? No. Pain Score  0 *  Have you tolerated food without any problems? Yes.    Have you been able to return to your normal activities? Yes.    Do you have any questions about your discharge instructions: Diet   No. Medications  No. Follow up visit  No.  Do you have questions or concerns about your Care? No.  Actions: * If pain score is 4 or above: No action needed, pain <4.

## 2016-10-13 ENCOUNTER — Other Ambulatory Visit: Payer: Self-pay | Admitting: *Deleted

## 2016-10-13 MED ORDER — CARVEDILOL 12.5 MG PO TABS: 12.5000 mg | ORAL_TABLET | Freq: Two times a day (BID) | ORAL | 2 refills | Status: DC

## 2016-10-13 NOTE — Telephone Encounter (Signed)
Rx has been sent to the pharmacy electronically. ° °

## 2016-11-01 DIAGNOSIS — B182 Chronic viral hepatitis C: Secondary | ICD-10-CM | POA: Diagnosis not present

## 2016-11-08 ENCOUNTER — Other Ambulatory Visit: Payer: Self-pay | Admitting: Cardiovascular Disease

## 2016-11-10 DIAGNOSIS — K7469 Other cirrhosis of liver: Secondary | ICD-10-CM | POA: Diagnosis not present

## 2016-11-10 DIAGNOSIS — B182 Chronic viral hepatitis C: Secondary | ICD-10-CM | POA: Diagnosis not present

## 2016-11-12 ENCOUNTER — Other Ambulatory Visit: Payer: Self-pay | Admitting: Nurse Practitioner

## 2016-11-12 DIAGNOSIS — K7469 Other cirrhosis of liver: Secondary | ICD-10-CM

## 2016-12-08 ENCOUNTER — Ambulatory Visit
Admission: RE | Admit: 2016-12-08 | Discharge: 2016-12-08 | Disposition: A | Discharge status: Home / Self Care | Payer: Medicare Other | Source: Ambulatory Visit | Attending: Nurse Practitioner | Admitting: Nurse Practitioner

## 2016-12-08 DIAGNOSIS — K7469 Other cirrhosis of liver: Secondary | ICD-10-CM

## 2016-12-08 DIAGNOSIS — K7460 Unspecified cirrhosis of liver: Secondary | ICD-10-CM | POA: Diagnosis not present

## 2016-12-23 ENCOUNTER — Other Ambulatory Visit: Payer: Self-pay

## 2016-12-23 MED ORDER — RAMIPRIL 10 MG PO CAPS: 10.0000 mg | ORAL_CAPSULE | Freq: Every day | ORAL | 1 refills | Status: DC

## 2016-12-30 IMAGING — US US ABDOMEN COMPLETE
1 series · 13 of 25 positions shown · non-contrast
Comparison: Abdominal and pelvic CT scan April 18, 2011

CLINICAL DATA: Hepatitis-C, mildly elevated liver enzymes, no
abdominal pain or other symptoms ; history of abdominal aortic
aneurysm and left renal cyst

EXAM:
ABDOMEN ULTRASOUND COMPLETE

[Series 1: us abdomen complete · 0.23mm/px · 13 of 90 slices shown]
[im 1/90]
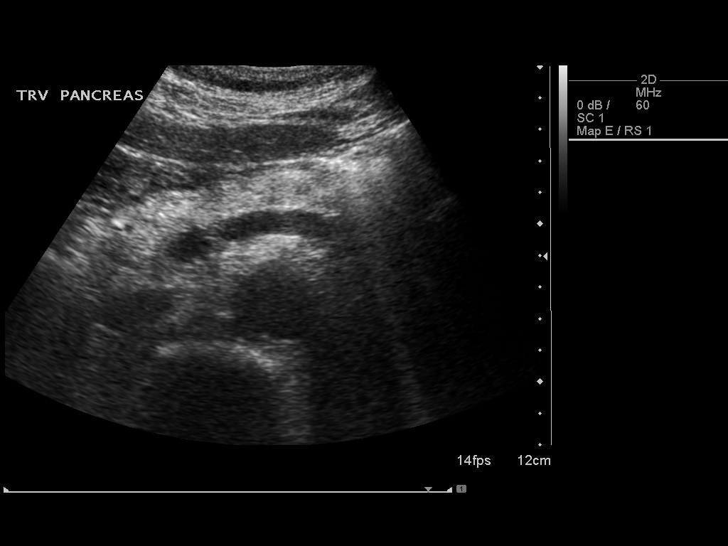
[im 8/90]
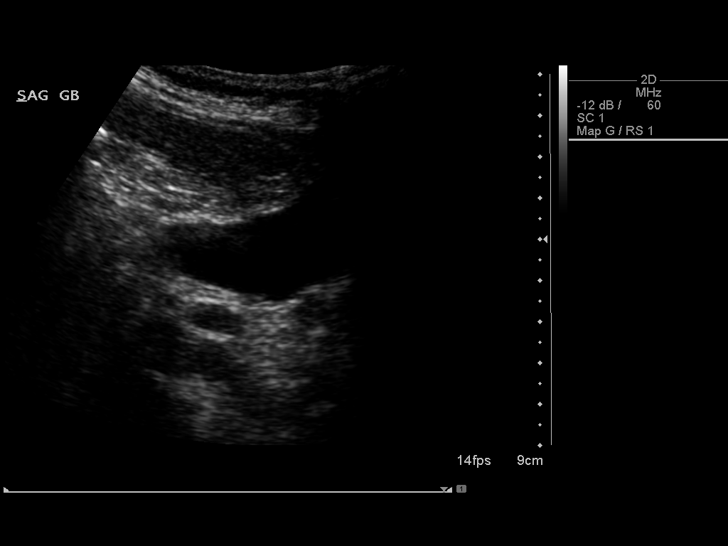
[im 15/90]
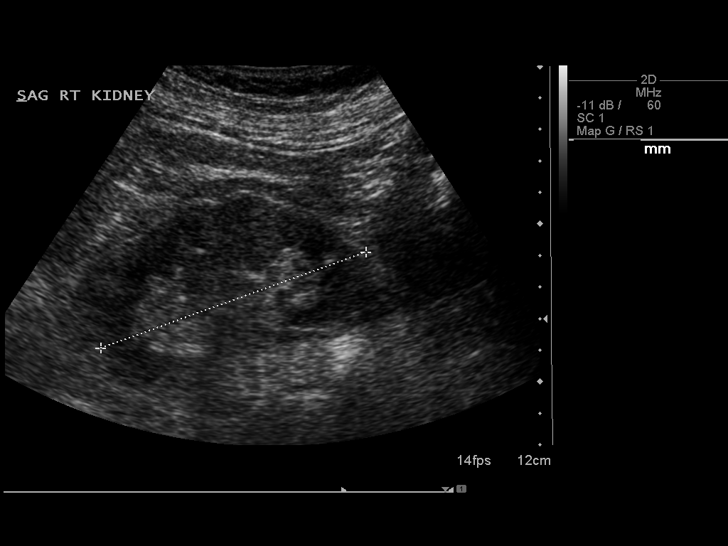
[im 23/90]
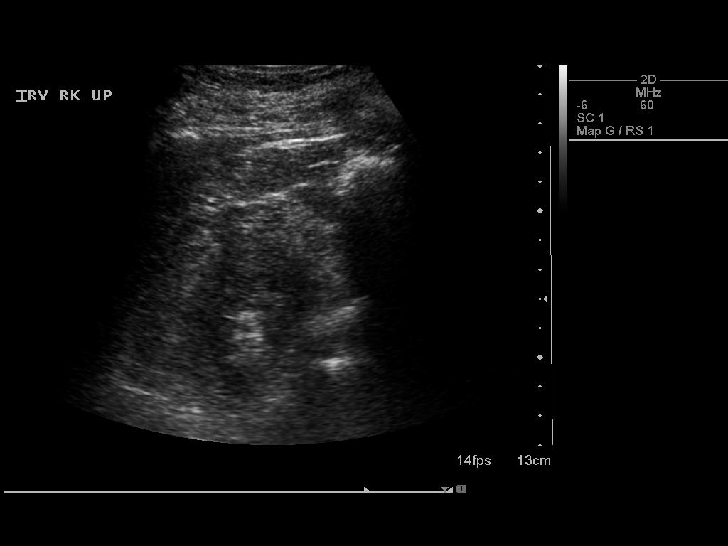
[im 30/90]
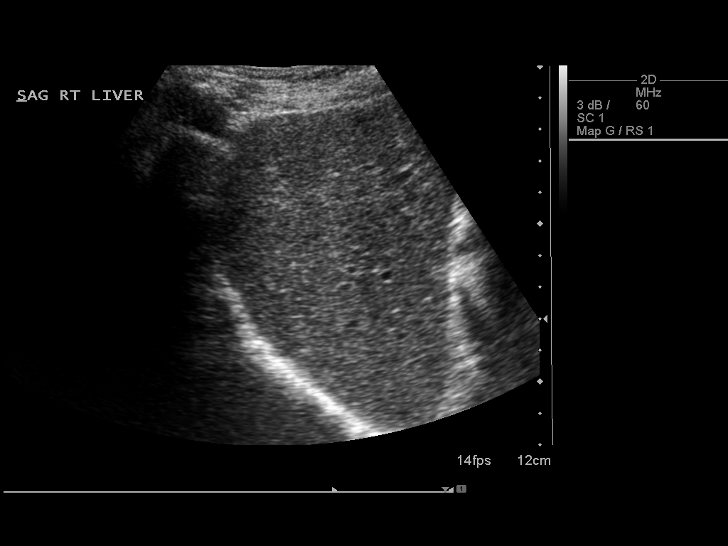
[im 38/90]
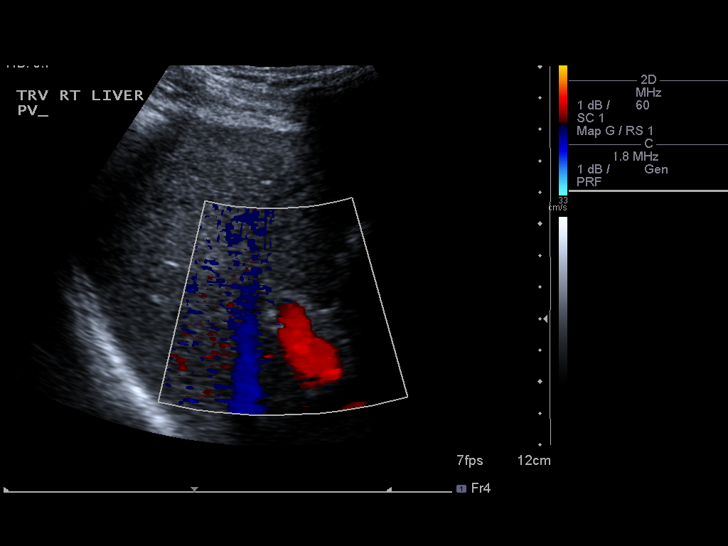
[im 45/90]
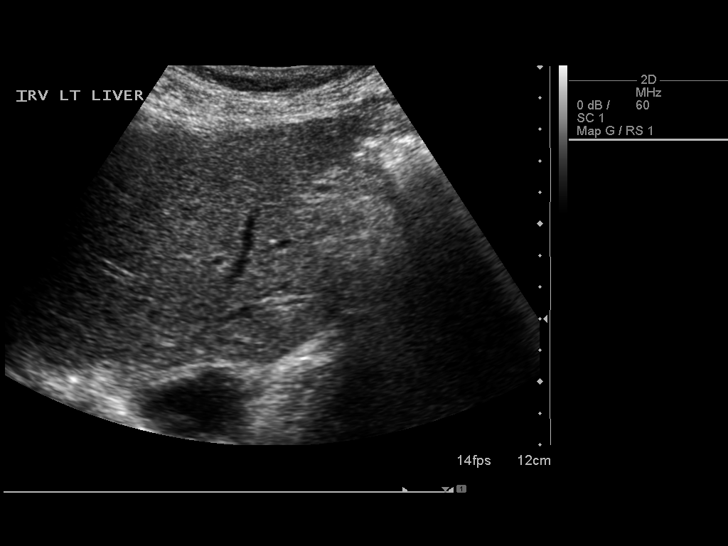
[im 52/90]
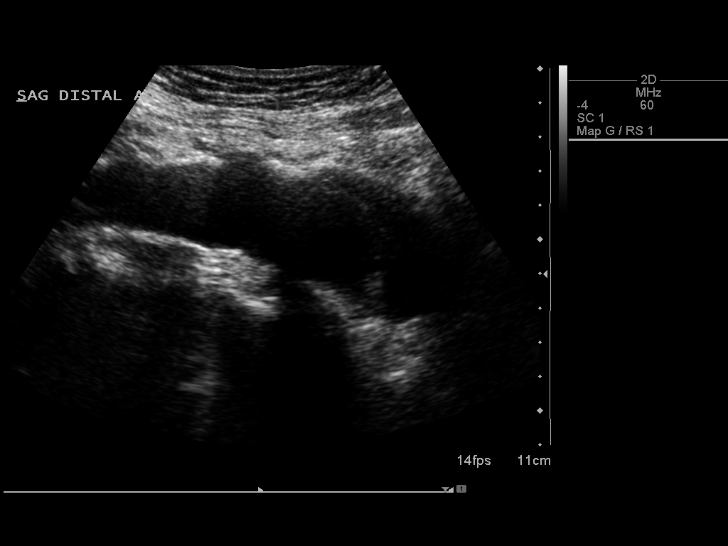
[im 60/90]
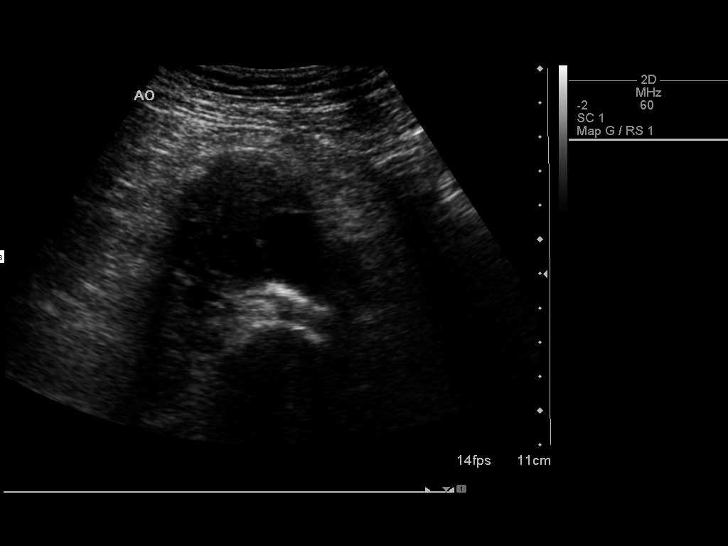
[im 67/90]
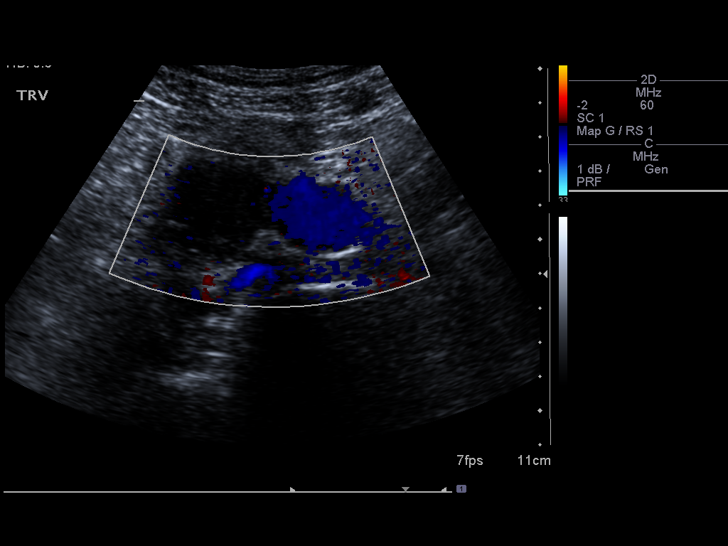
[im 75/90]
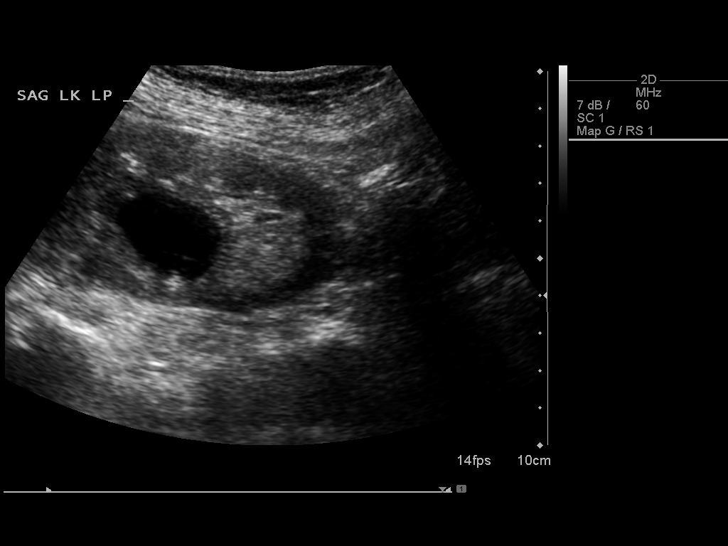
[im 82/90]
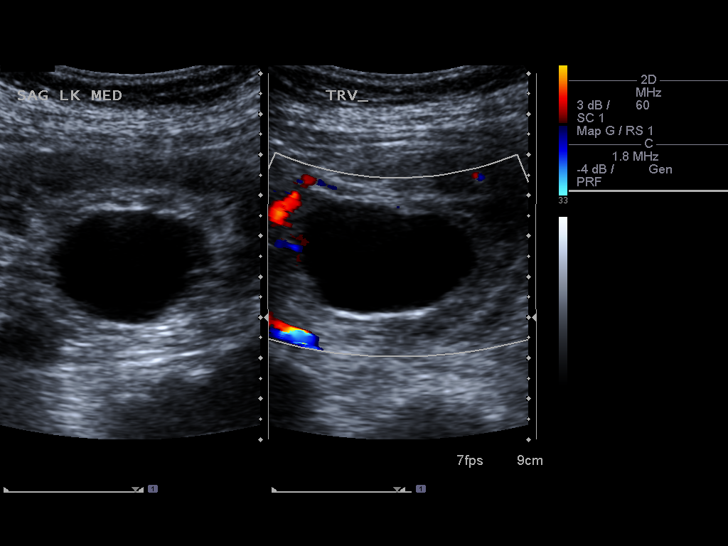
[im 90/90]
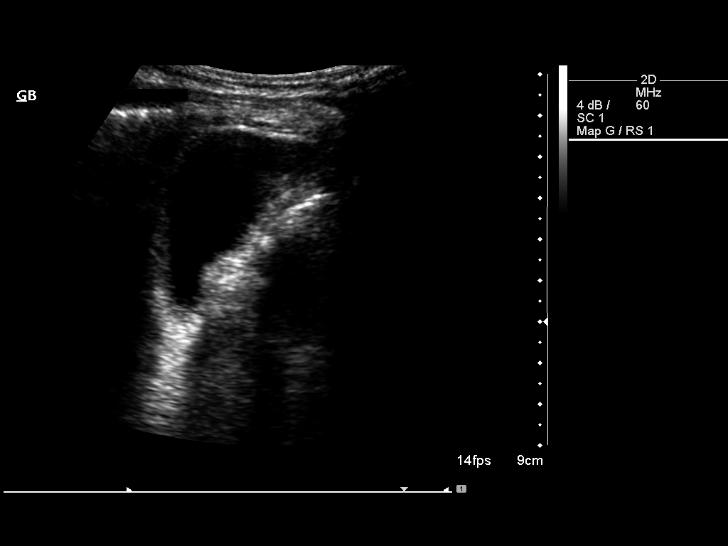

[13 of 25 positions shown; findings below may reference images not displayed]

FINDINGS: Gallbladder: No gallstones or wall thickening visualized. No
sonographic Murphy sign noted by sonographer.

Common bile duct: Diameter: 3.4 mm

Liver: The hepatic echotexture is normal. There is no focal mass or
ductal dilation. The surface contour of the liver is normal.

IVC: Evaluation of the inferior vena cava is limited due to bowel
gas.

Pancreas: Visualized portion unremarkable.

Spleen: Size and appearance within normal limits.

Right Kidney: Length: 8.9 cm.. The renal cortical echotexture
remains lower than that of the adjacent liver. There is no
hydronephrosis. There is an upper pole cyst measuring 9 mm in
diameter.

Left Kidney: Length: 9.9 cm. The renal cortical echotexture is
normal. There is no hydronephrosis. There is a dominant midpole cyst
measuring 3.9 x 2.8 x 4.4 cm.

Abdominal aorta: There is an abdominal aortic aneurysm measuring
cm AP x 4.3 cm transversely. The length of the aneurysm is 9.7 cm.
The right iliac artery measures 2.4 x 2.9 cm and is known to be
chronically occluded; the left iliac artery measures 2.0 x 2.3 cm.

Other findings: No ascites is observed.
IMPRESSION: 1. Normal appearance of the gallbladder and liver. If there are
clinical concerns of gallbladder dysfunction, a nuclear medicine
hepatobiliary scan may be useful.
2. Interval growth of the abdominal aortic aneurysm with maximal
diameter in the transverse plane now measuring 4.3 cm 4.0 x 4.3 cm
in AP and transverse dimensions. On the 1051 CT scan these
measurements were 3.1 x 3.4 cm. Recommend followup by ultrasound in
1 year. This recommendation follows ACR consensus guidelines: White
Paper of the ACR Incidental Findings Committee II on Vascular
Findings. [HOSPITAL] 0789; [DATE].
3. Simple appearing cysts in both kidneys.

## 2017-01-14 ENCOUNTER — Ambulatory Visit (HOSPITAL_COMMUNITY)
Admission: RE | Admit: 2017-01-14 | Discharge: 2017-01-14 | Disposition: A | Discharge status: Home / Self Care | Payer: Medicare Other | Source: Ambulatory Visit | Attending: Cardiovascular Disease | Admitting: Cardiovascular Disease

## 2017-01-14 DIAGNOSIS — I723 Aneurysm of iliac artery: Secondary | ICD-10-CM

## 2017-01-14 DIAGNOSIS — I714 Abdominal aortic aneurysm, without rupture, unspecified: Secondary | ICD-10-CM

## 2017-01-14 DIAGNOSIS — E785 Hyperlipidemia, unspecified: Secondary | ICD-10-CM | POA: Insufficient documentation

## 2017-01-14 DIAGNOSIS — I700 Atherosclerosis of aorta: Secondary | ICD-10-CM | POA: Diagnosis not present

## 2017-01-14 DIAGNOSIS — I708 Atherosclerosis of other arteries: Secondary | ICD-10-CM | POA: Diagnosis not present

## 2017-01-14 DIAGNOSIS — I10 Essential (primary) hypertension: Secondary | ICD-10-CM | POA: Diagnosis not present

## 2017-01-14 DIAGNOSIS — I2510 Atherosclerotic heart disease of native coronary artery without angina pectoris: Secondary | ICD-10-CM | POA: Insufficient documentation

## 2017-01-22 ENCOUNTER — Telehealth: Payer: Self-pay | Admitting: Family Medicine

## 2017-01-22 NOTE — Telephone Encounter (Signed)
Spoke to pt wife. She will have pt call back when he returns home.  Pt due for AWV  + labs with Lesia and OV 30 with PCP. Standard Medicare  Last AWV 10/24/15

## 2017-01-23 DIAGNOSIS — B182 Chronic viral hepatitis C: Secondary | ICD-10-CM | POA: Diagnosis not present

## 2017-01-29 DIAGNOSIS — K7469 Other cirrhosis of liver: Secondary | ICD-10-CM | POA: Diagnosis not present

## 2017-01-29 DIAGNOSIS — B182 Chronic viral hepatitis C: Secondary | ICD-10-CM | POA: Diagnosis not present

## 2017-02-06 ENCOUNTER — Telehealth: Payer: Self-pay | Admitting: Cardiovascular Disease

## 2017-02-06 NOTE — Telephone Encounter (Signed)
Patient aware of results and verbalized understanding.   Aware to repeat in 6 months.

## 2017-02-06 NOTE — Telephone Encounter (Signed)
Attempt to return call x 2-line busy.

## 2017-02-06 NOTE — Telephone Encounter (Signed)
New Message   pt verbalized that he is returning call for rn   And this is his third attempt  Please call pt at both numbers given

## 2017-02-06 NOTE — Telephone Encounter (Signed)
Returning your call. °

## 2017-02-24 NOTE — Telephone Encounter (Signed)
Scheduled 04/16/17

## 2017-03-02 DIAGNOSIS — Z23 Encounter for immunization: Secondary | ICD-10-CM | POA: Diagnosis not present

## 2017-03-12 ENCOUNTER — Ambulatory Visit (INDEPENDENT_AMBULATORY_CARE_PROVIDER_SITE_OTHER): Payer: Medicare Other | Admitting: Cardiovascular Disease

## 2017-03-12 ENCOUNTER — Encounter: Payer: Self-pay | Admitting: Cardiovascular Disease

## 2017-03-12 VITALS — BP 132/78 | HR 54 | Ht 67.0 in | Wt 131.0 lb

## 2017-03-12 DIAGNOSIS — B182 Chronic viral hepatitis C: Secondary | ICD-10-CM

## 2017-03-12 DIAGNOSIS — Z89611 Acquired absence of right leg above knee: Secondary | ICD-10-CM | POA: Diagnosis not present

## 2017-03-12 DIAGNOSIS — I714 Abdominal aortic aneurysm, without rupture, unspecified: Secondary | ICD-10-CM

## 2017-03-12 DIAGNOSIS — E785 Hyperlipidemia, unspecified: Secondary | ICD-10-CM

## 2017-03-12 DIAGNOSIS — I255 Ischemic cardiomyopathy: Secondary | ICD-10-CM | POA: Diagnosis not present

## 2017-03-12 DIAGNOSIS — Z955 Presence of coronary angioplasty implant and graft: Secondary | ICD-10-CM

## 2017-03-12 DIAGNOSIS — I2510 Atherosclerotic heart disease of native coronary artery without angina pectoris: Secondary | ICD-10-CM | POA: Diagnosis not present

## 2017-03-12 MED ORDER — ATORVASTATIN CALCIUM 40 MG PO TABS: 40.0000 mg | ORAL_TABLET | Freq: Every day | ORAL | 3 refills | Status: DC

## 2017-03-12 NOTE — Patient Instructions (Signed)
Medication Instructions:  INCREASE atorvastatin (Lipitor) to 40 mg daily  Testing/Procedures: Your physician has requested that you have an abdominal aorta duplex in FEB 2019. During this test, an ultrasound is used to evaluate the aorta. Allow 30 minutes for this exam. Do not eat after midnight the day before and avoid carbonated beverages  Follow-Up: Your physician recommends that you schedule a follow-up appointment in: March with Dr. Claiborne Billings    Any Other Special Instructions Will Be Listed Below (If Applicable).     If you need a refill on your cardiac medications before your next appointment, please call your pharmacy.
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
## 2017-03-18 NOTE — Addendum Note (Signed)
Addended by: Zebedee Iba on: 03/18/2017 09:36 AM   Modules accepted: Orders

## 2017-04-16 ENCOUNTER — Other Ambulatory Visit: Payer: Self-pay | Admitting: Family Medicine

## 2017-04-16 ENCOUNTER — Ambulatory Visit (INDEPENDENT_AMBULATORY_CARE_PROVIDER_SITE_OTHER): Payer: Medicare Other

## 2017-04-16 VITALS — BP 128/80 | HR 55 | Temp 97.9°F | Ht 66.5 in | Wt 134.5 lb

## 2017-04-16 DIAGNOSIS — E538 Deficiency of other specified B group vitamins: Secondary | ICD-10-CM

## 2017-04-16 DIAGNOSIS — D696 Thrombocytopenia, unspecified: Secondary | ICD-10-CM

## 2017-04-16 DIAGNOSIS — D7589 Other specified diseases of blood and blood-forming organs: Secondary | ICD-10-CM | POA: Diagnosis not present

## 2017-04-16 DIAGNOSIS — E785 Hyperlipidemia, unspecified: Secondary | ICD-10-CM

## 2017-04-16 DIAGNOSIS — Z0000 Encounter for general adult medical examination without abnormal findings: Secondary | ICD-10-CM | POA: Diagnosis not present

## 2017-04-16 DIAGNOSIS — Z23 Encounter for immunization: Secondary | ICD-10-CM | POA: Diagnosis not present

## 2017-04-16 LAB — COMPREHENSIVE METABOLIC PANEL
ALK PHOS: 74 U/L (ref 39–117)
ALT: 8 U/L (ref 0–53)
AST: 16 U/L (ref 0–37)
Albumin: 3.7 g/dL (ref 3.5–5.2)
BILIRUBIN TOTAL: 0.8 mg/dL (ref 0.2–1.2)
BUN: 18 mg/dL (ref 6–23)
CALCIUM: 9.5 mg/dL (ref 8.4–10.5)
CHLORIDE: 109 meq/L (ref 96–112)
CO2: 29 meq/L (ref 19–32)
CREATININE: 1.26 mg/dL (ref 0.40–1.50)
GFR: 58.97 mL/min — AB (ref >60.00)
GLUCOSE: 97 mg/dL (ref 70–99)
Potassium: 4.7 mEq/L (ref 3.5–5.1)
Sodium: 142 mEq/L (ref 135–145)
Total Protein: 6.6 g/dL (ref 6.0–8.3)

## 2017-04-16 LAB — CBC WITH DIFFERENTIAL/PLATELET
BASOS ABS: 0 10*3/uL (ref 0.0–0.1)
BASOS PCT: 0.8 % (ref 0.0–3.0)
EOS ABS: 0.4 10*3/uL (ref 0.0–0.7)
Eosinophils Relative: 7.4 % — ABNORMAL HIGH (ref 0.0–5.0)
HEMATOCRIT: 38.2 % — AB (ref 39.0–52.0)
Hemoglobin: 13 g/dL (ref 13.0–17.0)
LYMPHS ABS: 1.6 10*3/uL (ref 0.7–4.0)
LYMPHS PCT: 28 % (ref 12.0–46.0)
MCHC: 33.9 g/dL (ref 30.0–36.0)
MCV: 104 fl — ABNORMAL HIGH (ref 78.0–100.0)
Monocytes Absolute: 0.5 10*3/uL (ref 0.1–1.0)
Monocytes Relative: 8.6 % (ref 3.0–12.0)
NEUTROS ABS: 3.1 10*3/uL (ref 1.4–7.7)
NEUTROS PCT: 55.2 % (ref 43.0–77.0)
Platelets: 117 10*3/uL — ABNORMAL LOW (ref 150.0–400.0)
RBC: 3.67 Mil/uL — ABNORMAL LOW (ref 4.22–5.81)
RDW: 14.4 % (ref 11.5–15.5)
WBC: 5.7 10*3/uL (ref 4.0–10.5)

## 2017-04-16 LAB — LIPID PANEL
CHOLESTEROL: 115 mg/dL (ref 0–200)
HDL: 36.4 mg/dL — ABNORMAL LOW (ref >39.00)
LDL Cholesterol: 64 mg/dL (ref 0–99)
NonHDL: 78.47
Total CHOL/HDL Ratio: 3
Triglycerides: 72 mg/dL (ref 0.0–149.0)
VLDL: 14.4 mg/dL (ref 0.0–40.0)
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
## 2017-04-16 NOTE — Progress Notes (Signed)
PCP notes:   Health maintenance:  PPSV23 - administered Tetanus - postponed/insurance  Abnormal screenings:   Hearing - failed  Hearing Screening   125Hz  250Hz  500Hz  1000Hz  2000Hz  3000Hz  4000Hz  6000Hz  8000Hz   Right ear:   40 0 40  0    Left ear:   40 0 40  0     Patient concerns:   None  Nurse concerns:  None  Next PCP appt:   04/28/17 @ 0830

## 2017-04-16 NOTE — Progress Notes (Signed)
Pre visit review using our clinic review tool, if applicable. No additional management support is needed unless otherwise documented below in the visit note. 

## 2017-04-16 NOTE — Patient Instructions (Signed)
Stephen [name] , Thank you for taking time to come for your Medicare Wellness Visit. I appreciate your ongoing commitment to your health goals. Please review the following plan we discussed and let me know if I can assist you in the future.   These are the goals we discussed: Goals    Starting 04/16/2017, I will attempt to eat at least 4-5 servings of fresh fruits and vegetables daily.       This is a list of the screening recommended for you and due dates:  Health Maintenance  Topic Date Due  . DTaP/Tdap/Td vaccine (1 - Tdap) 04/16/2018*  . Tetanus Vaccine  04/16/2018*  . Flu Shot  Completed  . Pneumonia vaccines  Completed  *Topic was postponed. The date shown is not the original due date.   Preventive Care for Adults  A healthy lifestyle and preventive care can promote health and wellness. Preventive health guidelines for adults include the following key practices.  . A routine yearly physical is a good way to check with your health care provider about your health and preventive screening. It is a chance to share any concerns and updates on your health and to receive a thorough exam.  . Visit your dentist for a routine exam and preventive care every 6 months. Brush your teeth twice a day and floss once a day. Good oral hygiene prevents tooth decay and gum disease.  . The frequency of eye exams is based on your age, health, family medical history, use  of contact lenses, and other factors. Follow your health care provider's recommendations for frequency of eye exams.  . Eat a healthy diet. Foods like vegetables, fruits, whole grains, low-fat dairy products, and lean protein foods contain the nutrients you need without too many calories. Decrease your intake of foods high in solid fats, added sugars, and salt. Eat the right amount of calories for you. Get information about a proper diet from your health care provider, if necessary.  . Regular physical exercise is one of the most  important things you can do for your health. Most adults should get at least 150 minutes of moderate-intensity exercise (any activity that increases your heart rate and causes you to sweat) each week. In addition, most adults need muscle-strengthening exercises on 2 or more days a week.  Silver Sneakers may be a benefit available to you. To determine eligibility, you may visit the website: www.silversneakers.com or contact program at (717)120-3534 Mon-Fri between 8AM-8PM.   . Maintain a healthy weight. The body mass index (BMI) is a screening tool to identify possible weight problems. It provides an estimate of body fat based on height and weight. Your health care provider can find your BMI and can help you achieve or maintain a healthy weight.   For adults 20 years and older: ? A BMI below 18.5 is considered underweight. ? A BMI of 18.5 to 24.9 is normal. ? A BMI of 25 to 29.9 is considered overweight. ? A BMI of 30 and above is considered obese.   . Maintain normal blood lipids and cholesterol levels by exercising and minimizing your intake of saturated fat. Eat a balanced diet with plenty of fruit and vegetables. Blood tests for lipids and cholesterol should begin at age 16 and be repeated every 5 years. If your lipid or cholesterol levels are high, you are over 50, or you are at high risk for heart disease, you may need your cholesterol levels checked more frequently. Ongoing high lipid and  cholesterol levels should be treated with medicines if diet and exercise are not working.  . If you smoke, find out from your health care provider how to quit. If you do not use tobacco, please do not start.  . If you choose to drink alcohol, please do not consume more than 2 drinks per day. One drink is considered to be 12 ounces (355 mL) of beer, 5 ounces (148 mL) of wine, or 1.5 ounces (44 mL) of liquor.  . If you are 89-24 years old, ask your health care provider if you should take aspirin to prevent  strokes.  . Use sunscreen. Apply sunscreen liberally and repeatedly throughout the day. You should seek shade when your shadow is shorter than you. Protect yourself by wearing long sleeves, pants, a wide-brimmed hat, and sunglasses year round, whenever you are outdoors.  . Once a month, do a whole body skin exam, using a mirror to look at the skin on your back. Tell your health care provider of new moles, moles that have irregular borders, moles that are larger than a pencil eraser, or moles that have changed in shape or color.

## 2017-04-18 NOTE — Progress Notes (Signed)
I reviewed health advisor's note, was available for consultation, and agree with documentation and plan.  

## 2017-04-20 LAB — PATHOLOGIST SMEAR REVIEW

## 2017-04-28 ENCOUNTER — Encounter: Payer: Self-pay | Admitting: Family Medicine

## 2017-04-28 ENCOUNTER — Ambulatory Visit (INDEPENDENT_AMBULATORY_CARE_PROVIDER_SITE_OTHER): Payer: Medicare Other | Admitting: Family Medicine

## 2017-04-28 VITALS — BP 122/70 | HR 64 | Temp 97.8°F | Ht 67.0 in | Wt 134.0 lb

## 2017-04-28 DIAGNOSIS — B182 Chronic viral hepatitis C: Secondary | ICD-10-CM

## 2017-04-28 DIAGNOSIS — D7589 Other specified diseases of blood and blood-forming organs: Secondary | ICD-10-CM

## 2017-04-28 DIAGNOSIS — I255 Ischemic cardiomyopathy: Secondary | ICD-10-CM

## 2017-04-28 DIAGNOSIS — E785 Hyperlipidemia, unspecified: Secondary | ICD-10-CM | POA: Diagnosis not present

## 2017-04-28 DIAGNOSIS — Z720 Tobacco use: Secondary | ICD-10-CM

## 2017-04-28 DIAGNOSIS — Z7189 Other specified counseling: Secondary | ICD-10-CM | POA: Diagnosis not present

## 2017-04-28 DIAGNOSIS — I10 Essential (primary) hypertension: Secondary | ICD-10-CM

## 2017-04-28 DIAGNOSIS — D696 Thrombocytopenia, unspecified: Secondary | ICD-10-CM

## 2017-04-28 DIAGNOSIS — D539 Nutritional anemia, unspecified: Secondary | ICD-10-CM | POA: Insufficient documentation

## 2017-04-28 DIAGNOSIS — Z89611 Acquired absence of right leg above knee: Secondary | ICD-10-CM | POA: Diagnosis not present

## 2017-04-28 DIAGNOSIS — K7460 Unspecified cirrhosis of liver: Secondary | ICD-10-CM | POA: Diagnosis not present

## 2017-04-28 DIAGNOSIS — I714 Abdominal aortic aneurysm, without rupture, unspecified: Secondary | ICD-10-CM

## 2017-04-28 DIAGNOSIS — R5383 Other fatigue: Secondary | ICD-10-CM

## 2017-04-28 MED ORDER — ZOSTER VAC RECOMB ADJUVANTED 50 MCG/0.5ML IM SUSR: 0.5000 mL | Freq: Once | INTRAMUSCULAR | 1 refills | Status: AC

## 2017-04-28 NOTE — Assessment & Plan Note (Signed)
Ongoing, with some cold intolerance. Consider updated TSH next visit.

## 2017-04-28 NOTE — Progress Notes (Signed)
BP 122/70 (BP Location: Left Arm, Patient Position: Sitting, Cuff Size: Normal)   Pulse 64   Temp 97.8 F (36.6 C) (Oral)   Ht 5\' 7"  (1.702 m)   Wt 134 lb (60.8 kg)   SpO2 96%   BMI 20.99 kg/m    CC: medicare wellness visit Subjective:    Patient ID: Stephen [name], male    DOB: April 22, 1941, 76 y.o.   MRN: [id number]  HPI: Stephen [name] is a 76 y.o. male presenting on 04/28/2017 for Annual Exam (Pt 2)   Closely monitoring known AAA. Planned recheck 07/2017.  Doesn't regularly see eye doctor Overall feels well however stays tired and noticing some cold intolerance.  Sleeps well. Wakes up tired. No snoring or noted apnea.   Saw Lesia last week for medicare wellness visit. Note reviewed. Hearing screen failed.    Preventative: Colonoscopy 2009 per pt normal (?Amedeo Plenty) never received records Prostate cancer screening - age out.  Lung cancer screening - cigar smoker never cigarettes Flu shot -yearly  Tetanus shot - defer Pneumovax 2018, prevnar 2017 shingrix - discussed Advanced directive discussion - has at home. HCPOA would be wife. Will bring me copy to update chart. Seat belt use discussed Sunscreen use discussed. No changing moles on skin.  Occasional cigar smoker Alcohol - none  Moved to [place] 2006 Lives with wife and son, no pets Occupation: retired, was 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 Interim medical history since our last visit reviewed. Allergies and medications reviewed and updated. Outpatient Medications Prior to Visit  Medication Sig Dispense Refill  . aspirin EC 81 MG tablet Take 81 mg by mouth at bedtime.    [name] atorvastatin (LIPITOR) 40 MG tablet Take 1 tablet (40 mg total) by mouth daily. 90 tablet 3  . carvedilol (COREG) 12.5 MG tablet Take 1 tablet (12.5 mg total) by mouth 2 (two) times  daily with a meal. 180 tablet 2  . ezetimibe (ZETIA) 10 MG tablet TAKE 1 TABLET (10 MG TOTAL) BY MOUTH DAILY. 90 tablet 3  . Multiple Vitamin (MULITIVITAMIN WITH MINERALS) TABS Take 1 tablet by mouth daily.    . ramipril (ALTACE) 10 MG capsule Take 1 capsule (10 mg total) by mouth daily. 90 capsule 1  . nitroGLYCERIN (NITROSTAT) 0.4 MG SL tablet Place 1 tablet (0.4 mg total) under the tongue every 5 (five) minutes as needed for chest pain. 25 tablet 3   Facility-Administered Medications Prior to Visit  Medication Dose Route Frequency Provider Last Rate Last Dose  . 0.9 %  sodium chloride infusion  500 mL Intravenous Continuous Irene Shipper, MD         Per HPI unless specifically indicated in ROS section below Review of Systems     Objective:    BP 122/70 (BP Location: Left Arm, Patient Position: Sitting, Cuff Size: Normal)   Pulse 64   Temp 97.8 F (36.6 C) (Oral)   Ht 5\' 7"  (1.702 m)   Wt 134 lb (60.8 kg)   SpO2 96%   BMI 20.99 kg/m   Wt Readings from Last 3 Encounters:  04/28/17 134 lb (60.8 kg)  04/16/17 134 lb 8 oz (61 kg)  03/12/17 131 lb (59.4 kg)    Physical Exam  Constitutional: He is oriented to person, place, and time. He appears well-developed and well-nourished. No distress.  HENT:  Head:  Normocephalic and atraumatic.  Right Ear: Hearing, tympanic membrane, external ear and ear canal normal.  Left Ear: Hearing, tympanic membrane, external ear and ear canal normal.  Nose: Nose normal.  Mouth/Throat: Uvula is midline, oropharynx is clear and moist and mucous membranes are normal. No oropharyngeal exudate, posterior oropharyngeal edema or posterior oropharyngeal erythema.  Eyes: Conjunctivae and EOM are normal. Pupils are equal, round, and reactive to light. No scleral icterus.  Neck: Normal range of motion. Neck supple. Carotid bruit is not present. No thyromegaly present.  Cardiovascular: Normal rate, regular rhythm, normal heart sounds and intact distal pulses.    No murmur heard. Pulses:      Radial pulses are 2+ on the right side, and 2+ on the left side.  Pulmonary/Chest: Effort normal and breath sounds normal. No respiratory distress. He has no wheezes. He has no rales.  Abdominal: Soft. Bowel sounds are normal. He exhibits no distension and no mass. There is no tenderness. There is no rebound and no guarding.  Musculoskeletal: Normal range of motion. He exhibits no edema.  Lymphadenopathy:    He has no cervical adenopathy.  Neurological: He is alert and oriented to person, place, and time.  CN grossly intact, station and gait intact  Skin: Skin is warm and dry. No rash noted.  Psychiatric: He has a normal mood and affect. His behavior is normal. Judgment and thought content normal.  Nursing note and vitals reviewed.  Results for orders placed or performed in visit on 04/16/17  CBC with Differential/Platelet  Result Value Ref Range   WBC 5.7 4.0 - 10.5 K/uL   RBC 3.67 (L) 4.22 - 5.81 Mil/uL   Hemoglobin 13.0 13.0 - 17.0 g/dL   HCT 38.2 (L) 39.0 - 52.0 %   MCV 104.0 (H) 78.0 - 100.0 fl   MCHC 33.9 30.0 - 36.0 g/dL   RDW 14.4 11.5 - 15.5 %   Platelets 117.0 (L) 150.0 - 400.0 K/uL   Neutrophils Relative % 55.2 43.0 - 77.0 %   Lymphocytes Relative 28.0 12.0 - 46.0 %   Monocytes Relative 8.6 3.0 - 12.0 %   Eosinophils Relative 7.4 (H) 0.0 - 5.0 %   Basophils Relative 0.8 0.0 - 3.0 %   Neutro Abs 3.1 1.4 - 7.7 K/uL   Lymphs Abs 1.6 0.7 - 4.0 K/uL   Monocytes Absolute 0.5 0.1 - 1.0 K/uL   Eosinophils Absolute 0.4 0.0 - 0.7 K/uL   Basophils Absolute 0.0 0.0 - 0.1 K/uL  Comprehensive metabolic panel  Result Value Ref Range   Sodium 142 135 - 145 mEq/L   Potassium 4.7 3.5 - 5.1 mEq/L   Chloride 109 96 - 112 mEq/L   CO2 29 19 - 32 mEq/L   Glucose, Bld 97 70 - 99 mg/dL   BUN 18 6 - 23 mg/dL   Creatinine, Ser 1.26 0.40 - 1.50 mg/dL   Total Bilirubin 0.8 0.2 - 1.2 mg/dL   Alkaline Phosphatase 74 39 - 117 U/L   AST 16 0 - 37 U/L   ALT 8 0  - 53 U/L   Total Protein 6.6 6.0 - 8.3 g/dL   Albumin 3.7 3.5 - 5.2 g/dL   Calcium 9.5 8.4 - 10.5 mg/dL   GFR 58.97 (L) >60.00 mL/min  Lipid panel  Result Value Ref Range   Cholesterol 115 0 - 200 mg/dL   Triglycerides 72.0 0.0 - 149.0 mg/dL   HDL 36.40 (L) >39.00 mg/dL   VLDL 14.4 0.0 - 40.0 mg/dL  LDL Cholesterol 64 0 - 99 mg/dL   Total CHOL/HDL Ratio 3    NonHDL 78.47   Pathologist smear review  Result Value Ref Range   Path Review        Assessment & Plan:   Problem List Items Addressed This Visit      History of   AAA (abdominal aortic aneurysm) without rupture ([place])    Followed by cardiology. Appreciate their care.       HTN (hypertension), now hypotensive limiting medical Rx (Chronic)    Chronic, stable. Continue current regimen.         Other   Advanced care planning/counseling discussion    Advanced directive discussion - has at home. HCPOA would be wife. Will bring me copy to update chart.      Chronic hepatitis C with cirrhosis ([place])    Completed treatment with harvoni 2017.       Relevant Medications   Zoster Vaccine [facility]) injection   Dyslipidemia (Chronic)    Chronic, stable on current regimen of statin and zetia.       Fatigue    Ongoing, with some cold intolerance. Consider updated TSH next visit.       Hx of AKA (above knee amputation) ([facility]) (Chronic)    Stable period      Macrocytosis    Without anemia. ?b12 related. Advised 551mcg daily - he will check MVI dosing. Denies alcohol use.       Thrombocytopenia ([facility]) - Primary    Chronic, stable. Continue to monitor.       Tobacco use    Rare cigar          Follow up plan: Return in about 1 year (around 04/28/2018) for annual exam, prior fasting for blood work.  [name], MD

## 2017-04-28 NOTE — Assessment & Plan Note (Signed)
Completed treatment with harvoni 2017.

## 2017-04-28 NOTE — Patient Instructions (Addendum)
Sign release for records for lateset colonoscopy by Dr St[facility].  If interested, check with pharmacy about new 2 shot shingles series (shingrix).  Bring [place] copy of your living will.  Aim for at least 572mcg vitamin B12 daily. If multivitamin doesn't have this, start taking extra vitamin B12 528mcg daily.  Increase water intake by 1 glass a day.  You are doing well today. Return as needed or in 1 year for next wellness visit with Stephen [name] and follow up visit.  Health Maintenance, Male A healthy lifestyle and preventive care is important for your health and wellness. Ask your health care provider about what schedule of regular examinations is right for you. What should I know about weight and diet? Eat a Healthy Diet  Eat plenty of vegetables, fruits, whole grains, low-fat dairy products, and lean protein.  Do not eat a lot of foods high in solid fats, added sugars, or salt.  Maintain a Healthy Weight Regular exercise can help you achieve or maintain a healthy weight. You should:  Do at least 150 minutes of exercise each week. The exercise should increase your heart rate and make you sweat (moderate-intensity exercise).  Do strength-training exercises at least twice a week.  Watch Your Levels of Cholesterol and Blood Lipids  Have your blood tested for lipids and cholesterol every 5 years starting at 77 years of age. If you are at high risk for heart disease, you should start having your blood tested when you are 77 years old. You may need to have your cholesterol levels checked more often if: ? Your lipid or cholesterol levels are high. ? You are older than 77 years of age. ? You are at high risk for heart disease.  What should I know about cancer screening? Many types of cancers can be detected early and may often be prevented. Lung Cancer  You should be screened every year for lung cancer if: ? You are a current smoker who has smoked for at least 30 years. ? You are a former  smoker who has quit within the past 15 years.  Talk to your health care provider about your screening options, when you should start screening, and how often you should be screened.  Colorectal Cancer  Routine colorectal cancer screening usually begins at 77 years of age and should be repeated every 5-10 years until you are 77 years old. You may need to be screened more often if early forms of precancerous polyps or small growths are found. Your health care provider may recommend screening at an earlier age if you have risk factors for colon cancer.  Your health care provider may recommend using home test kits to check for hidden blood in the stool.  A small camera at the end of a tube can be used to examine your colon (sigmoidoscopy or colonoscopy). This checks for the earliest forms of colorectal cancer.  Prostate and Testicular Cancer  Depending on your age and overall health, your health care provider may do certain tests to screen for prostate and testicular cancer.  Talk to your health care provider about any symptoms or concerns you have about testicular or prostate cancer.  Skin Cancer  Check your skin from head to toe regularly.  Tell your health care provider about any new moles or changes in moles, especially if: ? There is a change in a mole's size, shape, or color. ? You have a mole that is larger than a pencil eraser.  Always use sunscreen.  Apply sunscreen liberally and repeat throughout the day.  Protect yourself by wearing long sleeves, pants, a wide-brimmed hat, and sunglasses when outside.  What should I know about heart disease, diabetes, and high blood pressure?  If you are 83-24 years of age, have your blood pressure checked every 3-5 years. If you are 96 years of age or older, have your blood pressure checked every year. You should have your blood pressure measured twice-once when you are at a hospital or clinic, and once when you are not at a hospital or clinic.  Record the average of the two measurements. To check your blood pressure when you are not at a hospital or clinic, you can use: ? An automated blood pressure machine at a pharmacy. ? A home blood pressure monitor.  Talk to your health care provider about your target blood pressure.  If you are between 34-42 years old, ask your health care provider if you should take aspirin to prevent heart disease.  Have regular diabetes screenings by checking your fasting blood sugar level. ? If you are at a normal weight and have a low risk for diabetes, have this test once every three years after the age of 47. ? If you are overweight and have a high risk for diabetes, consider being tested at a younger age or more often.  A one-time screening for abdominal aortic aneurysm (AAA) by ultrasound is recommended for men aged 100-75 years who are current or former smokers. What should I know about preventing infection? Hepatitis B If you have a higher risk for hepatitis B, you should be screened for this virus. Talk with your health care provider to find out if you are at risk for hepatitis B infection. Hepatitis C Blood testing is recommended for:  Everyone born from [birth year] through 1966.  Anyone with known risk factors for hepatitis C.  Sexually Transmitted Diseases (STDs)  You should be screened each year for STDs including gonorrhea and chlamydia if: ? You are sexually active and are younger than 77 years of age. ? You are older than 77 years of age and your health care provider tells you that you are at risk for this type of infection. ? Your sexual activity has changed since you were last screened and you are at an increased risk for chlamydia or gonorrhea. Ask your health care provider if you are at risk.  Talk with your health care provider about whether you are at high risk of being infected with HIV. Your health care provider may recommend a prescription medicine to help prevent HIV  infection.  What else can I do?  Schedule regular health, dental, and eye exams.  Stay current with your vaccines (immunizations).  Do not use any tobacco products, such as cigarettes, chewing tobacco, and e-cigarettes. If you need help quitting, ask your health care provider.  Limit alcohol intake to no more than 2 drinks per day. One drink equals 12 ounces of beer, 5 ounces of wine, or 1 ounces of hard liquor.  Do not use street drugs.  Do not share needles.  Ask your health care provider for help if you need support or information about quitting drugs.  Tell your health care provider if you often feel depressed.  Tell your health care provider if you have ever been abused or do not feel safe at home. This information is not intended to replace advice given to you by your health care provider. Make sure you discuss any questions you have  with your health care provider. Document Released: 11/15/2007 Document Revised: 01/16/2016 Document Reviewed: 02/20/2015 Elsevier Interactive Patient Education  [employer].

## 2017-04-28 NOTE — Assessment & Plan Note (Signed)
Chronic, stable on current regimen of statin and zetia.

## 2017-04-28 NOTE — Assessment & Plan Note (Signed)
Chronic, stable. Continue current regimen. 

## 2017-04-28 NOTE — Assessment & Plan Note (Signed)
Stable period.  

## 2017-04-28 NOTE — Assessment & Plan Note (Signed)
Advanced directive discussion - has at home. HCPOA would be wife. Will bring me copy to update chart.

## 2017-04-28 NOTE — Assessment & Plan Note (Addendum)
Chronic, stable. Continue to monitor.  

## 2017-04-28 NOTE — Assessment & Plan Note (Addendum)
Without anemia. ?b12 related. Advised 55mcg daily - he will check MVI dosing. Denies alcohol use.

## 2017-04-28 NOTE — Assessment & Plan Note (Signed)
Rare cigar.  

## 2017-04-28 NOTE — Assessment & Plan Note (Signed)
Followed by cardiology. Appreciate their care.

## 2017-05-03 ENCOUNTER — Other Ambulatory Visit: Payer: Self-pay | Admitting: Cardiovascular Disease

## 2017-07-01 DIAGNOSIS — K7469 Other cirrhosis of liver: Secondary | ICD-10-CM | POA: Diagnosis not present

## 2017-07-09 ENCOUNTER — Encounter: Payer: Self-pay | Admitting: Family Medicine

## 2017-07-09 ENCOUNTER — Ambulatory Visit (INDEPENDENT_AMBULATORY_CARE_PROVIDER_SITE_OTHER): Payer: Medicare Other | Admitting: Family Medicine

## 2017-07-09 VITALS — BP 120/60 | HR 63 | Temp 97.9°F

## 2017-07-09 DIAGNOSIS — S7002XA Contusion of left hip, initial encounter: Secondary | ICD-10-CM | POA: Diagnosis not present

## 2017-07-09 DIAGNOSIS — D696 Thrombocytopenia, unspecified: Secondary | ICD-10-CM

## 2017-07-09 NOTE — Progress Notes (Signed)
BP 120/60 (BP Location: Left Arm, Patient Position: Sitting, Cuff Size: Normal)   Pulse 63   Temp 97.9 F (36.6 C) (Oral)   SpO2 100%    CC: L hip injury  Subjective:    Patient ID: Stephen [name], male    DOB: Oct 29, 1940, 77 y.o.   MRN: [id number]  HPI: Stephen [name] is a 77 y.o. male presenting on 07/09/2017 for Hip Injury (Injured left hip due to a fall at home on concrete patio 07/01/17. Has pain, bruising and swelling. Says injury is improving.)   DOI: 07/01/2017 Fall onto concrete patio last week, landed on left lateral hip. Was able to get up and go to a scheduled doctor's visit. Next day felt worse - increased pain, swelling and bruising. 2 days later had severe swelling. Had to stay in bed for several days. Actually has noted progressive improvement over last several days.  No premonitory symptoms.  He is not on blood thinners.  Known R traumatic AKA.  Still ambulating well.   Mild ache at rest, worse pain with transitions into and out of seated position.   Relevant past medical, surgical, family and social history reviewed and updated as indicated. Interim medical history since our last visit reviewed. Allergies and medications reviewed and updated. Outpatient Medications Prior to Visit  Medication Sig Dispense Refill  . aspirin EC 81 MG tablet Take 81 mg by mouth at bedtime.    [name] atorvastatin (LIPITOR) 40 MG tablet Take 1 tablet (40 mg total) by mouth daily. 90 tablet 3  . carvedilol (COREG) 12.5 MG tablet Take 1 tablet (12.5 mg total) by mouth 2 (two) times daily with a meal. 180 tablet 2  . ezetimibe (ZETIA) 10 MG tablet TAKE 1 TABLET (10 MG TOTAL) BY MOUTH DAILY. 90 tablet 3  . Multiple Vitamin (MULITIVITAMIN WITH MINERALS) TABS Take 1 tablet by mouth daily.    . ramipril (ALTACE) 10 MG capsule Take 1 capsule (10 mg total) by mouth daily. 90 capsule 1  . nitroGLYCERIN (NITROSTAT) 0.4 MG SL tablet Place 1 tablet (0.4 mg total) under the tongue every 5 (five) minutes  as needed for chest pain. 25 tablet 3   Facility-Administered Medications Prior to Visit  Medication Dose Route Frequency Provider Last Rate Last Dose  . 0.9 %  sodium chloride infusion  500 mL Intravenous Continuous Irene Shipper, MD         Per HPI unless specifically indicated in ROS section below Review of Systems     Objective:    BP 120/60 (BP Location: Left Arm, Patient Position: Sitting, Cuff Size: Normal)   Pulse 63   Temp 97.9 F (36.6 C) (Oral)   SpO2 100%   Wt Readings from Last 3 Encounters:  04/28/17 134 lb (60.8 kg)  04/16/17 134 lb 8 oz (61 kg)  03/12/17 131 lb (59.4 kg)    Physical Exam  Constitutional: He appears well-developed and well-nourished. No distress.  Musculoskeletal: He exhibits no edema.  Skin: Skin is warm and dry. Bruising and ecchymosis noted.     S/p R AKA L lateral hip hematoma just below trochanteric bursitis. Ecchymosis present lateral and anterior thigh and extends into L groin and L upper buttock FROM L hip and knee Neg seated SLR  Nursing note and vitals reviewed.  Results for orders placed or performed in visit on 04/16/17  CBC with Differential/Platelet  Result Value Ref Range   WBC 5.7 4.0 - 10.5 K/uL   RBC 3.67 (  L) 4.22 - 5.81 Mil/uL   Hemoglobin 13.0 13.0 - 17.0 g/dL   HCT 38.2 (L) 39.0 - 52.0 %   MCV 104.0 (H) 78.0 - 100.0 fl   MCHC 33.9 30.0 - 36.0 g/dL   RDW 14.4 11.5 - 15.5 %   Platelets 117.0 (L) 150.0 - 400.0 K/uL   Neutrophils Relative % 55.2 43.0 - 77.0 %   Lymphocytes Relative 28.0 12.0 - 46.0 %   Monocytes Relative 8.6 3.0 - 12.0 %   Eosinophils Relative 7.4 (H) 0.0 - 5.0 %   Basophils Relative 0.8 0.0 - 3.0 %   Neutro Abs 3.1 1.4 - 7.7 K/uL   Lymphs Abs 1.6 0.7 - 4.0 K/uL   Monocytes Absolute 0.5 0.1 - 1.0 K/uL   Eosinophils Absolute 0.4 0.0 - 0.7 K/uL   Basophils Absolute 0.0 0.0 - 0.1 K/uL  Comprehensive metabolic panel  Result Value Ref Range   Sodium 142 135 - 145 mEq/L   Potassium 4.7 3.5 - 5.1  mEq/L   Chloride 109 96 - 112 mEq/L   CO2 29 19 - 32 mEq/L   Glucose, Bld 97 70 - 99 mg/dL   BUN 18 6 - 23 mg/dL   Creatinine, Ser 1.26 0.40 - 1.50 mg/dL   Total Bilirubin 0.8 0.2 - 1.2 mg/dL   Alkaline Phosphatase 74 39 - 117 U/L   AST 16 0 - 37 U/L   ALT 8 0 - 53 U/L   Total Protein 6.6 6.0 - 8.3 g/dL   Albumin 3.7 3.5 - 5.2 g/dL   Calcium 9.5 8.4 - 10.5 mg/dL   GFR 58.97 (L) >60.00 mL/min  Lipid panel  Result Value Ref Range   Cholesterol 115 0 - 200 mg/dL   Triglycerides 72.0 0.0 - 149.0 mg/dL   HDL 36.40 (L) >39.00 mg/dL   VLDL 14.4 0.0 - 40.0 mg/dL   LDL Cholesterol 64 0 - 99 mg/dL   Total CHOL/HDL Ratio 3    NonHDL 78.47   Pathologist smear review  Result Value Ref Range   Path Review        Assessment & Plan:   Problem List Items Addressed This Visit    Hip hematoma, left, initial encounter - Primary    Supportive care reviewed. Red flags to seek further care reviewed.       Thrombocytopenia ([place])    Likely contributed to hematoma formation.          Follow up plan: Return if symptoms worsen or fail to improve.  [name], MD

## 2017-07-09 NOTE — Assessment & Plan Note (Signed)
Supportive care reviewed. Red flags to seek further care reviewed.

## 2017-07-09 NOTE — Assessment & Plan Note (Signed)
Likely contributed to hematoma formation.

## 2017-07-09 NOTE — Patient Instructions (Addendum)
You likely had bony bruise (contusion) and thigh hematoma. This should improve over time. Take it easy, rest over the next 1-2 weeks. Initially ice now use heat to area. May take tylenol for discomfort. Let us know if not improving as expected  Hematoma A hematoma is a collection of blood under the skin, in an organ, in a body space, in a joint space, or in other tissue. The blood can thicken (clot) to form a lump that you can see and feel. The lump is often firm and may become sore and tender. Most hematomas get better in a few days to weeks. However, some hematomas may be serious and require medical care. Hematomas can range from very small to very large. What are the causes? This condition is caused by:  A blunt or penetrating injury.  A leakage from a blood vessel under the skin. This leak happens on its own (is spontaneous) and is more likely to occur in older people, especially those who take blood thinners.  Some medical procedures including surgeries, such as oral surgery, face lifts, and surgeries that involve the joints.  Some medical conditions that cause bleeding or bruising problems. There may be multiple hematomas that appear in different areas of the body.  What are the signs or symptoms? Symptoms of this condition can depend on where the hematoma is located. Common symptoms of a hematoma under the skin include:  A firm lump on the body.  Pain and tenderness in the area.  Bruising. Blue, dark blue, purple-red, or yellowish skin (discoloration) may appear at the site of the hematoma if the hematoma is close to the surface of the skin.  For hematomas in deeper tissues or body spaces, symptoms may be less obvious. A collection of blood in the stomach (intra-abdominal hematoma) may cause pain in the abdomen, weakness, fainting, and shortness of breath. A collection of blood in the head (intracranial hematoma) may cause a headache or symptoms such as weakness, trouble speaking or  understanding, or a change in consciousness. How is this diagnosed? This condition is diagnosed based on:  Your medical history.  A physical exam.  Imaging tests, such as an ultrasonogram or CT scan. These may be needed if your health care provider suspects a hematoma in deeper tissues or body spaces.  Blood tests. These may be needed if your health care provider believes that the hematoma is caused by a medical condition.  How is this treated? This condition usually does not need treatment because many hematomas go away on their own over time. However, large hematomas, or those that may affect vital organs, may need surgical drainage or monitoring. If the hematoma is caused by a medical condition, medicines may be prescribed. Follow these instructions at home: Managing pain, stiffness, and swelling  If directed, apply ice to the affected area: ? Put ice in a plastic bag. ? Place a towel between your skin and the bag. ? Leave the ice on for 20 minutes, 2-3 times a day for the first couple of days.  After applying ice for a couple of days, your health care provider may recommend that you apply warm compresses to the affected area instead. Do this as told by your health care provider. Remove the heat if your skin turns bright red. This is especially important if you are unable to feel pain, heat, or cold. You may have a greater risk of getting burned  Raise (elevate) the affected area above the level of your heart while  you are sitting or lying down.  Wrap the affected area with an elastic bandage, if told by your health care provider. The bandage applies pressure (compression) to the area, which may help to reduce swelling and help the hematoma heal. Make sure the bandage is not wrapped too tight.  If your hematoma is on a leg or foot (lower extremity) and is painful, your health care provider may recommend crutches. Use them as told by your health care provider. General  instructions  Take over-the-counter and prescription medicines only as told by your health care provider.  Keep all follow-up visits as told by your health care provider. This is important. Contact a health care provider if:  You have a fever.  The swelling or discoloration gets worse.  You develop more hematomas. Get help right away if:  Your pain is worse or your pain is not controlled with medicine.  Your skin over the hematoma breaks or starts bleeding.  Your hematoma is in your chest or abdomen and you have weakness, shortness of breath, or a change in consciousness.  You have a hematoma on your scalp caused by a fall or injury and you have a headache that gets worse, trouble speaking or understanding, weakness, or a change in alertness or consciousness. Summary  A hematoma is a collection of blood under the skin, in an organ, in a body space, in a joint space, or in other tissue.  This condition usually does not need treatment because many hematomas go away on their own over time.  Large hematomas, or those that may affect vital organs, may need surgical drainage or monitoring. If the hematoma is caused by a medical condition, medicines may be prescribed. This information is not intended to replace advice given to you by your health care provider. Make sure you discuss any questions you have with your health care provider. Document Released: 01/01/2004 Document Revised: 06/21/2016 Document Reviewed: 06/21/2016 Elsevier Interactive Patient Education  2018 [employer].

## 2017-07-24 ENCOUNTER — Ambulatory Visit (HOSPITAL_COMMUNITY)
Admission: RE | Admit: 2017-07-24 | Discharge: 2017-07-24 | Disposition: A | Discharge status: Home / Self Care | Payer: Medicare Other | Source: Ambulatory Visit | Attending: Cardiology | Admitting: Cardiology

## 2017-07-24 DIAGNOSIS — I714 Abdominal aortic aneurysm, without rupture, unspecified: Secondary | ICD-10-CM

## 2017-07-24 DIAGNOSIS — I77811 Abdominal aortic ectasia: Secondary | ICD-10-CM | POA: Insufficient documentation

## 2017-07-24 DIAGNOSIS — I771 Stricture of artery: Secondary | ICD-10-CM | POA: Diagnosis not present

## 2017-07-28 ENCOUNTER — Other Ambulatory Visit: Payer: Self-pay | Admitting: *Deleted

## 2017-07-28 DIAGNOSIS — I714 Abdominal aortic aneurysm, without rupture, unspecified: Secondary | ICD-10-CM

## 2017-07-29 ENCOUNTER — Other Ambulatory Visit: Payer: Self-pay | Admitting: Cardiovascular Disease

## 2017-07-30 NOTE — Telephone Encounter (Signed)
Rx(s) sent to pharmacy electronically.  

## 2017-08-10 ENCOUNTER — Ambulatory Visit (INDEPENDENT_AMBULATORY_CARE_PROVIDER_SITE_OTHER): Payer: Medicare Other | Admitting: Cardiovascular Disease

## 2017-08-10 ENCOUNTER — Encounter: Payer: Self-pay | Admitting: Cardiovascular Disease

## 2017-08-10 VITALS — BP 120/73 | HR 58 | Ht 68.0 in | Wt 135.0 lb

## 2017-08-10 DIAGNOSIS — Z955 Presence of coronary angioplasty implant and graft: Secondary | ICD-10-CM | POA: Diagnosis not present

## 2017-08-10 DIAGNOSIS — I2510 Atherosclerotic heart disease of native coronary artery without angina pectoris: Secondary | ICD-10-CM | POA: Diagnosis not present

## 2017-08-10 DIAGNOSIS — I255 Ischemic cardiomyopathy: Secondary | ICD-10-CM | POA: Diagnosis not present

## 2017-08-10 DIAGNOSIS — D696 Thrombocytopenia, unspecified: Secondary | ICD-10-CM

## 2017-08-10 DIAGNOSIS — D7589 Other specified diseases of blood and blood-forming organs: Secondary | ICD-10-CM | POA: Diagnosis not present

## 2017-08-10 DIAGNOSIS — E785 Hyperlipidemia, unspecified: Secondary | ICD-10-CM

## 2017-08-10 DIAGNOSIS — I714 Abdominal aortic aneurysm, without rupture, unspecified: Secondary | ICD-10-CM

## 2017-08-10 NOTE — Patient Instructions (Signed)
Medication Instructions:  Your physician recommends that you continue on your current medications as directed. Please refer to the Current Medication list given to you today.  Testing/Procedures: Your physician has requested that you have an echocardiogram. Echocardiography is a painless test that uses sound waves to create images of your heart. It provides your doctor with information about the size and shape of your heart and how well your heart's chambers and valves are working. This procedure takes approximately one hour. There are no restrictions for this procedure. This will be done at our [facility] location:  [place] has requested that you have a lexiscan myoview. For further information please visit [email or web address]. Please follow instruction sheet, as given.  Follow-Up: Your physician wants you to follow-up in: 6 months with Dr. Claiborne Billings (unless needed sooner). You will receive a reminder letter in the mail two months in advance. If you don't receive a letter, please call our office to schedule the follow-up appointment.   If you need a refill on your cardiac medications before your next appointment, please call your pharmacy.
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
## 2017-08-11 ENCOUNTER — Telehealth (HOSPITAL_COMMUNITY): Payer: Self-pay

## 2017-08-11 NOTE — Telephone Encounter (Signed)
Encounter complete. 

## 2017-08-13 ENCOUNTER — Ambulatory Visit (HOSPITAL_COMMUNITY)
Admission: RE | Admit: 2017-08-13 | Discharge: 2017-08-13 | Disposition: A | Discharge status: Home / Self Care | Payer: Medicare Other | Source: Ambulatory Visit | Attending: Cardiovascular Disease | Admitting: Cardiovascular Disease

## 2017-08-13 DIAGNOSIS — Z955 Presence of coronary angioplasty implant and graft: Secondary | ICD-10-CM | POA: Diagnosis not present

## 2017-08-13 DIAGNOSIS — I2510 Atherosclerotic heart disease of native coronary artery without angina pectoris: Secondary | ICD-10-CM | POA: Insufficient documentation

## 2017-08-13 DIAGNOSIS — I255 Ischemic cardiomyopathy: Secondary | ICD-10-CM

## 2017-08-13 LAB — MYOCARDIAL PERFUSION IMAGING
CHL CUP NUCLEAR SRS: 32
LVDIAVOL: 215 mL (ref 62–150)
LVSYSVOL: 161 mL
Peak HR: 77 {beats}/min
Rest HR: 47 {beats}/min
SDS: 3
SSS: 35
TID: 1.13

## 2017-08-13 MED ORDER — REGADENOSON 0.4 MG/5ML IV SOLN
0.4000 mg | Freq: Once | INTRAVENOUS | Status: AC
  Administered 2017-08-13: 0.4 mg via INTRAVENOUS

## 2017-08-13 MED ORDER — TECHNETIUM TC 99M TETROFOSMIN IV KIT
10.3000 | PACK | Freq: Once | INTRAVENOUS | Status: AC | PRN
  Administered 2017-08-13: 10.3 via INTRAVENOUS
  Filled 2017-08-13: qty 11

## 2017-08-13 MED ORDER — TECHNETIUM TC 99M TETROFOSMIN IV KIT
32.1000 | PACK | Freq: Once | INTRAVENOUS | Status: AC | PRN
  Administered 2017-08-13: 32.1 via INTRAVENOUS
  Filled 2017-08-13: qty 33

## 2017-08-14 ENCOUNTER — Ambulatory Visit (HOSPITAL_COMMUNITY): Payer: Medicare Other | Attending: Cardiovascular Disease

## 2017-08-14 ENCOUNTER — Other Ambulatory Visit: Payer: Self-pay

## 2017-08-14 DIAGNOSIS — I255 Ischemic cardiomyopathy: Secondary | ICD-10-CM

## 2017-08-14 DIAGNOSIS — Z955 Presence of coronary angioplasty implant and graft: Secondary | ICD-10-CM | POA: Diagnosis not present

## 2017-08-14 DIAGNOSIS — I5030 Unspecified diastolic (congestive) heart failure: Secondary | ICD-10-CM | POA: Diagnosis not present

## 2017-08-14 DIAGNOSIS — I080 Rheumatic disorders of both mitral and aortic valves: Secondary | ICD-10-CM | POA: Diagnosis not present

## 2017-08-14 DIAGNOSIS — I2510 Atherosclerotic heart disease of native coronary artery without angina pectoris: Secondary | ICD-10-CM

## 2017-08-14 MED ORDER — PERFLUTREN LIPID MICROSPHERE
1.0000 mL | INTRAVENOUS | Status: AC | PRN
  Administered 2017-08-14: 2 mL via INTRAVENOUS

## 2017-08-17 ENCOUNTER — Other Ambulatory Visit: Payer: Self-pay

## 2017-08-17 ENCOUNTER — Encounter: Payer: Self-pay | Admitting: Surgery

## 2017-08-17 ENCOUNTER — Ambulatory Visit (INDEPENDENT_AMBULATORY_CARE_PROVIDER_SITE_OTHER): Payer: Medicare Other | Admitting: Surgery

## 2017-08-17 VITALS — BP 156/77 | HR 66 | Resp 20 | Ht 68.0 in | Wt 136.6 lb

## 2017-08-17 DIAGNOSIS — I255 Ischemic cardiomyopathy: Secondary | ICD-10-CM

## 2017-08-17 DIAGNOSIS — I714 Abdominal aortic aneurysm, without rupture, unspecified: Secondary | ICD-10-CM
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
## 2017-08-18 ENCOUNTER — Other Ambulatory Visit: Payer: Self-pay

## 2017-08-18 DIAGNOSIS — I713 Abdominal aortic aneurysm, ruptured, unspecified: Secondary | ICD-10-CM

## 2017-08-18 DIAGNOSIS — I739 Peripheral vascular disease, unspecified: Secondary | ICD-10-CM

## 2017-08-18 DIAGNOSIS — Z01812 Encounter for preprocedural laboratory examination: Secondary | ICD-10-CM

## 2017-08-18 DIAGNOSIS — I6529 Occlusion and stenosis of unspecified carotid artery: Secondary | ICD-10-CM

## 2017-08-25 ENCOUNTER — Ambulatory Visit (INDEPENDENT_AMBULATORY_CARE_PROVIDER_SITE_OTHER)
Admission: RE | Admit: 2017-08-25 | Discharge: 2017-08-25 | Disposition: A | Discharge status: Home / Self Care | Payer: Medicare Other | Source: Ambulatory Visit | Attending: Vascular Surgery | Admitting: Vascular Surgery

## 2017-08-25 ENCOUNTER — Ambulatory Visit (HOSPITAL_COMMUNITY)
Admission: RE | Admit: 2017-08-25 | Discharge: 2017-08-25 | Disposition: A | Discharge status: Home / Self Care | Payer: Medicare Other | Source: Ambulatory Visit | Attending: Vascular Surgery | Admitting: Vascular Surgery

## 2017-08-25 DIAGNOSIS — I2510 Atherosclerotic heart disease of native coronary artery without angina pectoris: Secondary | ICD-10-CM | POA: Insufficient documentation

## 2017-08-25 DIAGNOSIS — Z01812 Encounter for preprocedural laboratory examination: Secondary | ICD-10-CM

## 2017-08-25 DIAGNOSIS — I739 Peripheral vascular disease, unspecified: Secondary | ICD-10-CM

## 2017-08-25 DIAGNOSIS — I6529 Occlusion and stenosis of unspecified carotid artery: Secondary | ICD-10-CM | POA: Diagnosis not present

## 2017-08-25 DIAGNOSIS — I714 Abdominal aortic aneurysm, without rupture: Secondary | ICD-10-CM | POA: Insufficient documentation

## 2017-08-25 DIAGNOSIS — I10 Essential (primary) hypertension: Secondary | ICD-10-CM | POA: Insufficient documentation

## 2017-08-25 DIAGNOSIS — I6523 Occlusion and stenosis of bilateral carotid arteries: Secondary | ICD-10-CM | POA: Insufficient documentation

## 2017-08-25 DIAGNOSIS — R0989 Other specified symptoms and signs involving the circulatory and respiratory systems: Secondary | ICD-10-CM | POA: Diagnosis present

## 2017-08-31 ENCOUNTER — Ambulatory Visit (INDEPENDENT_AMBULATORY_CARE_PROVIDER_SITE_OTHER): Payer: Medicare Other | Admitting: Surgery

## 2017-08-31 ENCOUNTER — Other Ambulatory Visit: Payer: Self-pay | Admitting: *Deleted

## 2017-08-31 ENCOUNTER — Ambulatory Visit
Admission: RE | Admit: 2017-08-31 | Discharge: 2017-08-31 | Disposition: A | Discharge status: Home / Self Care | Payer: Medicare Other | Source: Ambulatory Visit | Attending: Surgery | Admitting: Surgery

## 2017-08-31 ENCOUNTER — Encounter: Payer: Self-pay | Admitting: Surgery

## 2017-08-31 ENCOUNTER — Telehealth: Payer: Self-pay | Admitting: *Deleted

## 2017-08-31 ENCOUNTER — Encounter: Payer: Self-pay | Admitting: *Deleted

## 2017-08-31 VITALS — BP 127/75 | HR 76 | Temp 97.2°F | Resp 18 | Ht 68.0 in | Wt 135.0 lb

## 2017-08-31 DIAGNOSIS — I713 Abdominal aortic aneurysm, ruptured, unspecified: Secondary | ICD-10-CM

## 2017-08-31 DIAGNOSIS — I714 Abdominal aortic aneurysm, without rupture, unspecified: Secondary | ICD-10-CM

## 2017-08-31 DIAGNOSIS — I255 Ischemic cardiomyopathy: Secondary | ICD-10-CM

## 2017-08-31 MED ORDER — IOPAMIDOL (ISOVUE-370) INJECTION 76%
75.0000 mL | Freq: Once | INTRAVENOUS | Status: AC | PRN
  Administered 2017-08-31: 75 mL via INTRAVENOUS
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
## 2017-08-31 NOTE — Telephone Encounter (Signed)
-----   Message from Willy Eddy, RN sent at 08/31/2017  2:12 PM EDT ----- Regarding: Cardiac clearance Patient is scheduled for EVAR with Dr. Trula Slade for 09/11/17. This message to confirm Cardiac Clearance from your stand point and may proceed with surgery as scheduled? Thank you, Archivist

## 2017-09-01 ENCOUNTER — Telehealth: Payer: Self-pay | Admitting: *Deleted

## 2017-09-01 NOTE — Telephone Encounter (Signed)
Patient called with time change for surgery. Instructed to be at [facility] admitting at 5:30 am on 09/11/17.

## 2017-09-01 NOTE — Telephone Encounter (Addendum)
   Primary Cardiologist: Shelva Majestic, MD  Dr. Claiborne Billings:  Please comment on recommendations for surgical clearance based upon recent stress test and echocardiogram. **Please route back to CV DIV PREOP**   Chart reviewed as part of pre-operative protocol coverage.  The patient has a history of large anterior myocardial infarction in April 2013 treated with extensive PCI to the LAD.  He was taken off of Plavix in August 2015 when he presented with a spontaneous subdural hematoma.  EF has been as low as 20% of the time of his myocardial infarction.  Records indicate his EF improved to 35-40%.  Last seen by Dr. Claiborne Billings 08/10/17.  Follow-up echo was obtained 08/14/17: EF 20-25, anteroseptal, inferoseptal, inferior, apical akinesis, mild diastolic dysfunction.  Nuclear stress test 08/13/17: EF 25, large anterior, septal, inferior, apical scar, no ischemia.  Although patient does not have ischemia on his Nuclear stress test, his EF is severely depressed at 20-25%. Therefore, the note will be routed to Dr. Claiborne Billings for final input regarding surgical clearance.   Richardson Dopp, PA-C 09/01/2017, 4:07 PM

## 2017-09-02 NOTE — Telephone Encounter (Signed)
Patient has known old anterior myocardial infarction with reduced EF.  No ischemia noted on nuclear study.  Clearance given for upcoming abdominal aortic aneurysm EVAR.

## 2017-09-03 ENCOUNTER — Telehealth: Payer: Self-pay | Admitting: *Deleted

## 2017-09-03 NOTE — Telephone Encounter (Signed)
-----   Message from Erlene Quan, [place] sent at 09/03/2017  8:36 AM EDT ----- This pt's records were reviewed by Dr Claiborne Billings and he has cleared the patient for surgery.  Kerin Ransom PA-C 09/03/2017 8:37 AM

## 2017-09-07 NOTE — Pre-Procedure Instructions (Signed)
Stephen [name]  09/07/2017      [id number] - [place], [place] [address] Phone: [phone] Fax: [phone]    Your procedure is scheduled on Friday March 12.  Report to [facility] Admitting at 5:30 A.M.  Call this number if you have problems the morning of surgery:  [phone]   Remember:  Do not eat food or drink liquids after midnight.  Take these medicines the morning of surgery with A SIP OF WATER: Carvedilol (coreg)  7 days prior to surgery STOP taking any Aleve, Naproxen, Ibuprofen, Motrin, Advil, Goody's, BC's, all herbal medications, fish oil, and all vitamins    Do not wear jewelry, make-up or nail polish.  Do not wear lotions, powders, or perfumes, or deodorant.  Do not shave 48 hours prior to surgery.  Men may shave face and neck.  Do not bring valuables to the hospital.  [facility] is not responsible for any belongings or valuables.  Contacts, dentures or bridgework may not be worn into surgery.  Leave your suitcase in the car.  After surgery it may be brought to your room.  For patients admitted to the hospital, discharge time will be determined by your treatment team.  Patients discharged the day of surgery will not be allowed to drive home.    Special instructions:    [place]- Preparing For Surgery  Before surgery, you can play an important role. Because skin is not sterile, your skin needs to be as free of germs as possible. You can reduce the number of germs on your skin by washing with CHG (chlorahexidine gluconate) Soap before surgery.  CHG is an antiseptic cleaner which kills germs and bonds with the skin to continue killing germs even after washing.  Please do not use if you have an allergy to CHG or antibacterial soaps. If your skin becomes reddened/irritated stop using the CHG.  Do not shave (including legs and underarms) for at least 48 hours prior to first CHG shower.  It is OK to shave your face.  Please follow these instructions carefully.   1. Shower the NIGHT BEFORE SURGERY and the MORNING OF SURGERY with CHG.   2. If you chose to wash your hair, wash your hair first as usual with your normal shampoo.  3. After you shampoo, rinse your hair and body thoroughly to remove the shampoo.  4. Use CHG as you would any other liquid soap. You can apply CHG directly to the skin and wash gently with a scrungie or a clean washcloth.   5. Apply the CHG Soap to your body ONLY FROM THE NECK DOWN.  Do not use on open wounds or open sores. Avoid contact with your eyes, ears, mouth and genitals (private parts). Wash Face and genitals (private parts)  with your normal soap.  6. Wash thoroughly, paying special attention to the area where your surgery will be performed.  7. Thoroughly rinse your body with warm water from the neck down.  8. DO NOT shower/wash with your normal soap after using and rinsing off the CHG Soap.  9. Pat yourself dry with a CLEAN TOWEL.  10. Wear CLEAN PAJAMAS to bed the night before surgery, wear comfortable clothes the morning of surgery  11. Place CLEAN SHEETS on your bed the night of your first shower and DO NOT SLEEP WITH PETS.    Day of Surgery: Do not apply any deodorants/lotions. Please wear clean  clothes to the hospital/surgery center.      Please read over the following fact sheets that you were given. Coughing and Deep Breathing, MRSA Information and Surgical Site Infection Prevention

## 2017-09-08 ENCOUNTER — Other Ambulatory Visit: Payer: Self-pay | Admitting: *Deleted

## 2017-09-08 ENCOUNTER — Telehealth: Payer: Self-pay | Admitting: *Deleted

## 2017-09-08 ENCOUNTER — Other Ambulatory Visit: Payer: Self-pay

## 2017-09-08 ENCOUNTER — Encounter (HOSPITAL_COMMUNITY)
Admission: RE | Admit: 2017-09-08 | Discharge: 2017-09-08 | Disposition: A | Discharge status: Home / Self Care | Payer: Medicare Other | Source: Ambulatory Visit | Attending: Surgery | Admitting: Surgery

## 2017-09-08 ENCOUNTER — Encounter (HOSPITAL_COMMUNITY): Payer: Self-pay

## 2017-09-08 LAB — COMPREHENSIVE METABOLIC PANEL
ALT: 12 U/L — AB (ref 17–63)
AST: 22 U/L (ref 15–41)
Albumin: 3.6 g/dL (ref 3.5–5.0)
Alkaline Phosphatase: 96 U/L (ref 38–126)
Anion gap: 10 (ref 5–15)
BUN: 16 mg/dL (ref 6–20)
CHLORIDE: 112 mmol/L — AB (ref 101–111)
CO2: 23 mmol/L (ref 22–32)
CREATININE: 1.4 mg/dL — AB (ref 0.61–1.24)
Calcium: 9.3 mg/dL (ref 8.9–10.3)
GFR calc non Af Amer: 47 mL/min — ABNORMAL LOW (ref >60)
GFR, EST AFRICAN AMERICAN: 54 mL/min — AB (ref >60)
Glucose, Bld: 135 mg/dL — ABNORMAL HIGH (ref 65–99)
POTASSIUM: 3.8 mmol/L (ref 3.5–5.1)
SODIUM: 145 mmol/L (ref 135–145)
Total Bilirubin: 0.6 mg/dL (ref 0.3–1.2)
Total Protein: 6.7 g/dL (ref 6.5–8.1)

## 2017-09-08 LAB — URINALYSIS, ROUTINE W REFLEX MICROSCOPIC
Bilirubin Urine: NEGATIVE
Glucose, UA: NEGATIVE mg/dL
Ketones, ur: 5 mg/dL — AB
Nitrite: NEGATIVE
PH: 5 (ref 5.0–8.0)
Protein, ur: 100 mg/dL — AB
SPECIFIC GRAVITY, URINE: 1.018 (ref 1.005–1.030)
Squamous Epithelial / LPF: NONE SEEN

## 2017-09-08 LAB — TYPE AND SCREEN
ABO/RH(D): A POS
ANTIBODY SCREEN: NEGATIVE

## 2017-09-08 LAB — CBC
HCT: 39.7 % (ref 39.0–52.0)
HEMOGLOBIN: 13.4 g/dL (ref 13.0–17.0)
MCH: 36 pg — ABNORMAL HIGH (ref 26.0–34.0)
MCHC: 33.8 g/dL (ref 30.0–36.0)
MCV: 106.7 fL — ABNORMAL HIGH (ref 78.0–100.0)
Platelets: 142 10*3/uL — ABNORMAL LOW (ref 150–400)
RBC: 3.72 MIL/uL — ABNORMAL LOW (ref 4.22–5.81)
RDW: 13.4 % (ref 11.5–15.5)
WBC: 7 10*3/uL (ref 4.0–10.5)

## 2017-09-08 LAB — APTT: APTT: 33 s (ref 24–36)

## 2017-09-08 LAB — PROTIME-INR
INR: 1.26
Prothrombin Time: 15.7 seconds — ABNORMAL HIGH (ref 11.4–15.2)

## 2017-09-08 LAB — SURGICAL PCR SCREEN
MRSA, PCR: NEGATIVE
STAPHYLOCOCCUS AUREUS: NEGATIVE

## 2017-09-08 MED ORDER — CIPROFLOXACIN HCL 500 MG PO TABS: 500.0000 mg | ORAL_TABLET | Freq: Two times a day (BID) | ORAL | 0 refills | Status: DC

## 2017-09-08 NOTE — Telephone Encounter (Signed)
-----   Message from Willy Eddy, RN sent at 09/08/2017 12:40 PM EDT ----- Regarding: FYI E-Script for Cipro due to abnormal U/A 09/08/17. Patient instructed to start today and plenty of water. Surgery is scheduled for 09/11/17. B

## 2017-09-08 NOTE — Progress Notes (Addendum)
PCP - Dr. Danise Mina Cardiologist - Dr Claiborne Billings  EKG - 08/10/17 Stress Test - 08/13/17 ECHO - 08/14/17 Cardiac Cath - 2013 stents placed by Dr. Claiborne Billings  Anesthesia review: hx MI and CAD, pt reports no problems since stent placed in 2013  Patient denies shortness of breath, fever, cough and chest pain at PAT appointment   Patient verbalized understanding of instructions that were given to them at the PAT appointment. Patient was also instructed that they will need to review over the PAT instructions again at home before surgery.   Spoke with Jacqlyn Larsen at Dr. Stephens Shire office to notify of abnormal U/A

## 2017-09-08 NOTE — Telephone Encounter (Signed)
Patient called and instructed to start antibiotic today and drink plenty of water. Sent to his pharmacy. Abnormal U/A.message to Dr. Trula Slade.

## 2017-09-09 NOTE — Progress Notes (Signed)
Anesthesia Chart Review:  Pt is a 77 year old male scheduled for abdominal aortic endovascular stent graft on 09/11/2017 with Harold Barban, MD  - PCP is [name], MD - Cardiologist is Shelva Majestic, MD who cleared pt for surgery 09/02/17  PMH includes:  CAD (s/p DES x4 to LAD "almost entire LAD system" in 2013), cardiomyopathy, AAA, HTN, hepatitis C. Current smoker. BMI 20.5  Medications include: ASA 81 mg, Lipitor, carvedilol, Zetia, ramipril.  BP (!) 154/74   Pulse 74   Temp 36.6 C (Oral)   Resp 18   Ht 5\' 8"  (1.727 m)   Wt 135 lb (61.2 kg)   SpO2 97%   BMI 20.53 kg/m   Preoperative labs reviewed.   - PT 15.7. PTT normal.  - Platelets 142 - Liver function normal - UA with significant WBCs, RBCs. Cipro started by Dr. Stephens Shire office.   Carotid duplex 08/25/17:  - Right Carotid: The extracranial vessels were near-normal with only minimal wall thickening or plaque. - Left Carotid: The extracranial vessels were near-normal with only minimal wallthickening or plaque.  Echo 08/14/17:  - Procedure narrative: Transthoracic echocardiography. Image quality was poor. The study was technically difficult. Intravenous contrast (Definity) was administered. - Left ventricle: The cavity size was mildly dilated. Systolic function was severely reduced. The estimated ejection fraction was in the range of 20% to 25%. Diffuse hypokinesis. Akinesis of the anteroseptal, inferoseptal, mid-apical inferior, and apical myocardium. Doppler parameters are consistent with abnormal left ventricular relaxation (grade 1 diastolic dysfunction). Doppler parameters are consistent with high ventricular filling pressure. - Aortic valve: Transvalvular velocity was within the normal range. There was no stenosis. There was mild regurgitation. Regurgitation pressure half-time: 922 ms. - Mitral valve: Transvalvular velocity was within the normal range. There was no evidence for stenosis. There was mild  regurgitation. - Right ventricle: The cavity size was normal. Wall thickness was normal. Systolic function was normal. - Atrial septum: Akinesis of the anteroseptal, inferoseptal, mid-apical inferior, and apical myocardium. - Tricuspid valve: There was no regurgitation. - Dr. Claiborne Billings comments: "EP referral for potential elective ICD"  Nuclear stress test 08/13/17:   The left ventricular ejection fraction is severely decreased (<30%).  Nuclear stress EF: 25%.  No T wave inversion was noted during stress.  There was no ST segment deviation noted during stress.  Defect 1: There is a large defect of severe severity.  This is a high risk study. - Dr. Claiborne Billings notes: Old LAD scar without reversible ischemia. High risk based on reduced EF.  Pt with reduced EF at 25%, down from 35-40% in 2013 at time of PCI to LAD.  Cardiologist Dr. Claiborne Billings has cleared pt for surgery. If no changes, I anticipate pt can proceed with surgery as scheduled.   Willeen Cass, FNP-BC [facility]Anesthesiology Phone: [phone] 09/09/2017 12:41 PM

## 2017-09-10 ENCOUNTER — Other Ambulatory Visit: Payer: Self-pay | Admitting: *Deleted

## 2017-09-10 DIAGNOSIS — I255 Ischemic cardiomyopathy: Secondary | ICD-10-CM

## 2017-09-10 NOTE — Anesthesia Preprocedure Evaluation (Addendum)
Anesthesia Evaluation  Patient identified by MRN, date of birth, ID band Patient awake    Reviewed: Allergy & Precautions, H&P , NPO status , Patient's Chart, lab work & pertinent test results, reviewed documented beta blocker date and time   Airway Mallampati: II  TM Distance: >3 FB Neck ROM: Full    Dental no notable dental hx. (+) Partial Lower, Partial Upper, Dental Advisory Given   Pulmonary Current Smoker,    Pulmonary exam normal breath sounds clear to auscultation       Cardiovascular Exercise Tolerance: Good hypertension, Pt. on medications and Pt. on home beta blockers + Past MI, + Cardiac Stents, + Peripheral Vascular Disease and +CHF   Rhythm:Regular Rate:Normal     Neuro/Psych negative neurological ROS  negative psych ROS   GI/Hepatic negative GI ROS, Neg liver ROS,   Endo/Other  negative endocrine ROS  Renal/GU negative Renal ROS  negative genitourinary   Musculoskeletal   Abdominal   Peds  Hematology negative hematology ROS (+)   Anesthesia Other Findings   Reproductive/Obstetrics negative OB ROS                            Anesthesia Physical Anesthesia Plan  ASA: IV  Anesthesia Plan: General   Post-op Pain Management:    Induction: Intravenous  PONV Risk Score and Plan: 2 and Ondansetron and Dexamethasone  Airway Management Planned: Oral ETT  Additional Equipment: Arterial line  Intra-op Plan:   Post-operative Plan: Extubation in OR  Informed Consent: I have reviewed the patients History and Physical, chart, labs and discussed the procedure including the risks, benefits and alternatives for the proposed anesthesia with the patient or authorized representative who has indicated his/her understanding and acceptance.   Dental advisory given  Plan Discussed with: CRNA  Anesthesia Plan Comments:       Anesthesia Quick Evaluation

## 2017-09-11 ENCOUNTER — Inpatient Hospital Stay (HOSPITAL_COMMUNITY): Payer: Medicare Other

## 2017-09-11 ENCOUNTER — Other Ambulatory Visit: Payer: Self-pay

## 2017-09-11 ENCOUNTER — Inpatient Hospital Stay (HOSPITAL_COMMUNITY)
Admission: RE | Admit: 2017-09-11 | Discharge: 2017-09-12 | DRG: 269 | Disposition: A | Discharge status: Home / Self Care | Payer: Medicare Other | Source: Ambulatory Visit | Attending: Surgery | Admitting: Surgery

## 2017-09-11 ENCOUNTER — Inpatient Hospital Stay (HOSPITAL_COMMUNITY): Payer: Medicare Other | Admitting: Emergency Medicine

## 2017-09-11 ENCOUNTER — Inpatient Hospital Stay (HOSPITAL_COMMUNITY): Payer: Medicare Other | Admitting: Anesthesiology

## 2017-09-11 ENCOUNTER — Encounter (HOSPITAL_COMMUNITY): Payer: Self-pay | Admitting: *Deleted

## 2017-09-11 ENCOUNTER — Encounter (HOSPITAL_COMMUNITY)
Admission: RE | Disposition: A | Discharge status: Home / Self Care | Payer: Self-pay | Source: Ambulatory Visit | Attending: Surgery

## 2017-09-11 DIAGNOSIS — I110 Hypertensive heart disease with heart failure: Secondary | ICD-10-CM | POA: Diagnosis not present

## 2017-09-11 DIAGNOSIS — I10 Essential (primary) hypertension: Secondary | ICD-10-CM | POA: Diagnosis present

## 2017-09-11 DIAGNOSIS — Z48812 Encounter for surgical aftercare following surgery on the circulatory system: Secondary | ICD-10-CM

## 2017-09-11 DIAGNOSIS — Z79899 Other long term (current) drug therapy: Secondary | ICD-10-CM

## 2017-09-11 DIAGNOSIS — Z955 Presence of coronary angioplasty implant and graft: Secondary | ICD-10-CM

## 2017-09-11 DIAGNOSIS — I714 Abdominal aortic aneurysm, without rupture, unspecified: Secondary | ICD-10-CM

## 2017-09-11 DIAGNOSIS — F17290 Nicotine dependence, other tobacco product, uncomplicated: Secondary | ICD-10-CM | POA: Diagnosis present

## 2017-09-11 DIAGNOSIS — Z7982 Long term (current) use of aspirin: Secondary | ICD-10-CM

## 2017-09-11 DIAGNOSIS — I252 Old myocardial infarction: Secondary | ICD-10-CM | POA: Diagnosis not present

## 2017-09-11 DIAGNOSIS — E7800 Pure hypercholesterolemia, unspecified: Secondary | ICD-10-CM | POA: Diagnosis present

## 2017-09-11 DIAGNOSIS — Z8619 Personal history of other infectious and parasitic diseases: Secondary | ICD-10-CM

## 2017-09-11 DIAGNOSIS — Z95828 Presence of other vascular implants and grafts: Secondary | ICD-10-CM | POA: Diagnosis not present

## 2017-09-11 DIAGNOSIS — I255 Ischemic cardiomyopathy: Secondary | ICD-10-CM | POA: Diagnosis not present

## 2017-09-11 DIAGNOSIS — Z9889 Other specified postprocedural states: Secondary | ICD-10-CM

## 2017-09-11 DIAGNOSIS — I509 Heart failure, unspecified: Secondary | ICD-10-CM | POA: Diagnosis not present

## 2017-09-11 HISTORY — PX: ABDOMINAL AORTIC ENDOVASCULAR STENT GRAFT: SHX5707

## 2017-09-11 LAB — BASIC METABOLIC PANEL
Anion gap: 8 (ref 5–15)
BUN: 23 mg/dL — AB (ref 6–20)
CHLORIDE: 114 mmol/L — AB (ref 101–111)
CO2: 19 mmol/L — AB (ref 22–32)
CREATININE: 1.21 mg/dL (ref 0.61–1.24)
Calcium: 8.5 mg/dL — ABNORMAL LOW (ref 8.9–10.3)
GFR calc Af Amer: >60 mL/min (ref >60)
GFR calc non Af Amer: 56 mL/min — ABNORMAL LOW (ref >60)
Glucose, Bld: 99 mg/dL (ref 65–99)
Potassium: 4.1 mmol/L (ref 3.5–5.1)
SODIUM: 141 mmol/L (ref 135–145)

## 2017-09-11 LAB — PROTIME-INR
INR: 1.44
Prothrombin Time: 17.4 seconds — ABNORMAL HIGH (ref 11.4–15.2)

## 2017-09-11 LAB — CBC
HCT: 32 % — ABNORMAL LOW (ref 39.0–52.0)
HEMOGLOBIN: 10.6 g/dL — AB (ref 13.0–17.0)
MCH: 34 pg (ref 26.0–34.0)
MCHC: 33.1 g/dL (ref 30.0–36.0)
MCV: 102.6 fL — ABNORMAL HIGH (ref 78.0–100.0)
Platelets: 100 10*3/uL — ABNORMAL LOW (ref 150–400)
RBC: 3.12 MIL/uL — ABNORMAL LOW (ref 4.22–5.81)
RDW: 13.2 % (ref 11.5–15.5)
WBC: 6 10*3/uL (ref 4.0–10.5)

## 2017-09-11 LAB — MAGNESIUM: Magnesium: 1.8 mg/dL (ref 1.7–2.4)

## 2017-09-11 LAB — APTT: aPTT: 52 seconds — ABNORMAL HIGH (ref 24–36)

## 2017-09-11 SURGERY — INSERTION, ENDOVASCULAR STENT GRAFT, AORTA, ABDOMINAL
Anesthesia: General | Site: Abdomen

## 2017-09-11 MED ORDER — PHENYLEPHRINE HCL 10 MG/ML IJ SOLN
INTRAVENOUS | Status: DC | PRN
  Administered 2017-09-11: 25 ug/min via INTRAVENOUS

## 2017-09-11 MED ORDER — GUAIFENESIN-DM 100-10 MG/5ML PO SYRP: 15.0000 mL | ORAL_SOLUTION | ORAL | Status: DC | PRN

## 2017-09-11 MED ORDER — MAGNESIUM SULFATE 2 GM/50ML IV SOLN: 2.0000 g | Freq: Every day | INTRAVENOUS | Status: DC | PRN

## 2017-09-11 MED ORDER — SODIUM CHLORIDE 0.9 % IV SOLN
INTRAVENOUS | Status: DC | PRN
  Administered 2017-09-11: 09:00:00

## 2017-09-11 MED ORDER — SUGAMMADEX SODIUM 200 MG/2ML IV SOLN
INTRAVENOUS | Status: DC | PRN
  Administered 2017-09-11: 244.8 mg via INTRAVENOUS

## 2017-09-11 MED ORDER — ROCURONIUM BROMIDE 100 MG/10ML IV SOLN
INTRAVENOUS | Status: DC | PRN
  Administered 2017-09-11: 50 mg via INTRAVENOUS

## 2017-09-11 MED ORDER — SODIUM CHLORIDE 0.9 % IV SOLN: INTRAVENOUS | Status: DC

## 2017-09-11 MED ORDER — ADULT MULTIVITAMIN W/MINERALS CH
1.0000 | ORAL_TABLET | Freq: Every day | ORAL | Status: DC
  Administered 2017-09-11 – 2017-09-12 (×2): 1 via ORAL
  Filled 2017-09-11 (×2): qty 1

## 2017-09-11 MED ORDER — FENTANYL CITRATE (PF) 250 MCG/5ML IJ SOLN
INTRAMUSCULAR | Status: AC
  Filled 2017-09-11: qty 5

## 2017-09-11 MED ORDER — CARVEDILOL 12.5 MG PO TABS
12.5000 mg | ORAL_TABLET | Freq: Two times a day (BID) | ORAL | Status: DC
  Administered 2017-09-11 – 2017-09-12 (×2): 12.5 mg via ORAL
  Filled 2017-09-11 (×2): qty 1

## 2017-09-11 MED ORDER — CEFAZOLIN SODIUM-DEXTROSE 2-4 GM/100ML-% IV SOLN
2.0000 g | INTRAVENOUS | Status: AC
  Administered 2017-09-11: 2 g via INTRAVENOUS

## 2017-09-11 MED ORDER — DOCUSATE SODIUM 100 MG PO CAPS
100.0000 mg | ORAL_CAPSULE | Freq: Every day | ORAL | Status: DC
  Administered 2017-09-12: 100 mg via ORAL
  Filled 2017-09-11: qty 1

## 2017-09-11 MED ORDER — LIDOCAINE 2% (20 MG/ML) 5 ML SYRINGE
INTRAMUSCULAR | Status: AC
  Filled 2017-09-11: qty 5

## 2017-09-11 MED ORDER — CHLORHEXIDINE GLUCONATE 4 % EX LIQD: 60.0000 mL | Freq: Once | CUTANEOUS | Status: DC

## 2017-09-11 MED ORDER — FENTANYL CITRATE (PF) 100 MCG/2ML IJ SOLN
INTRAMUSCULAR | Status: DC | PRN
  Administered 2017-09-11 (×4): 50 ug via INTRAVENOUS

## 2017-09-11 MED ORDER — LACTATED RINGERS IV SOLN
INTRAVENOUS | Status: DC | PRN
  Administered 2017-09-11: 07:00:00 via INTRAVENOUS

## 2017-09-11 MED ORDER — DEXAMETHASONE SODIUM PHOSPHATE 10 MG/ML IJ SOLN
INTRAMUSCULAR | Status: AC
  Filled 2017-09-11: qty 1

## 2017-09-11 MED ORDER — IODIXANOL 320 MG/ML IV SOLN
INTRAVENOUS | Status: DC | PRN
  Administered 2017-09-11: 26.2 mL via INTRAVENOUS

## 2017-09-11 MED ORDER — OXYCODONE HCL 5 MG PO TABS: 5.0000 mg | ORAL_TABLET | Freq: Four times a day (QID) | ORAL | 0 refills | Status: DC | PRN

## 2017-09-11 MED ORDER — PROPOFOL 10 MG/ML IV BOLUS
INTRAVENOUS | Status: DC | PRN
  Administered 2017-09-11: 50 mg via INTRAVENOUS

## 2017-09-11 MED ORDER — NITROGLYCERIN 0.4 MG SL SUBL: 0.4000 mg | SUBLINGUAL_TABLET | SUBLINGUAL | Status: DC | PRN

## 2017-09-11 MED ORDER — ACETAMINOPHEN 325 MG PO TABS: 325.0000 mg | ORAL_TABLET | ORAL | Status: DC | PRN

## 2017-09-11 MED ORDER — CIPROFLOXACIN HCL 500 MG PO TABS
500.0000 mg | ORAL_TABLET | Freq: Two times a day (BID) | ORAL | Status: DC
  Administered 2017-09-11 – 2017-09-12 (×2): 500 mg via ORAL
  Filled 2017-09-11 (×2): qty 1

## 2017-09-11 MED ORDER — RAMIPRIL 10 MG PO CAPS
10.0000 mg | ORAL_CAPSULE | Freq: Every day | ORAL | Status: DC
  Administered 2017-09-11 – 2017-09-12 (×2): 10 mg via ORAL
  Filled 2017-09-11 (×2): qty 1

## 2017-09-11 MED ORDER — POTASSIUM CHLORIDE CRYS ER 20 MEQ PO TBCR: 20.0000 meq | EXTENDED_RELEASE_TABLET | Freq: Every day | ORAL | Status: DC | PRN

## 2017-09-11 MED ORDER — PROTAMINE SULFATE 10 MG/ML IV SOLN
INTRAVENOUS | Status: DC | PRN
  Administered 2017-09-11: 50 mg via INTRAVENOUS

## 2017-09-11 MED ORDER — HYDRALAZINE HCL 20 MG/ML IJ SOLN
5.0000 mg | INTRAMUSCULAR | Status: DC | PRN
  Administered 2017-09-11: 5 mg via INTRAVENOUS

## 2017-09-11 MED ORDER — FENTANYL CITRATE (PF) 100 MCG/2ML IJ SOLN: 25.0000 ug | INTRAMUSCULAR | Status: DC | PRN

## 2017-09-11 MED ORDER — HEPARIN SODIUM (PORCINE) 5000 UNIT/ML IJ SOLN
INTRAMUSCULAR | Status: AC
  Filled 2017-09-11: qty 1.2

## 2017-09-11 MED ORDER — HYDRALAZINE HCL 20 MG/ML IJ SOLN
INTRAMUSCULAR | Status: AC
  Filled 2017-09-11: qty 1

## 2017-09-11 MED ORDER — PROPOFOL 10 MG/ML IV BOLUS
INTRAVENOUS | Status: AC
  Filled 2017-09-11: qty 20

## 2017-09-11 MED ORDER — LABETALOL HCL 5 MG/ML IV SOLN: 10.0000 mg | INTRAVENOUS | Status: DC | PRN

## 2017-09-11 MED ORDER — ROCURONIUM BROMIDE 10 MG/ML (PF) SYRINGE
PREFILLED_SYRINGE | INTRAVENOUS | Status: AC
  Filled 2017-09-11: qty 5

## 2017-09-11 MED ORDER — SUGAMMADEX SODIUM 500 MG/5ML IV SOLN
INTRAVENOUS | Status: AC
  Filled 2017-09-11: qty 5

## 2017-09-11 MED ORDER — METOPROLOL TARTRATE 5 MG/5ML IV SOLN: 2.0000 mg | INTRAVENOUS | Status: DC | PRN

## 2017-09-11 MED ORDER — SODIUM CHLORIDE 0.9 % IV SOLN
500.0000 mL | INTRAVENOUS | Status: DC
  Administered 2017-09-11: 500 mL via INTRAVENOUS

## 2017-09-11 MED ORDER — ONDANSETRON HCL 4 MG/2ML IJ SOLN: 4.0000 mg | Freq: Four times a day (QID) | INTRAMUSCULAR | Status: DC | PRN

## 2017-09-11 MED ORDER — MORPHINE SULFATE (PF) 2 MG/ML IV SOLN: 1.0000 mg | INTRAVENOUS | Status: DC | PRN

## 2017-09-11 MED ORDER — SODIUM CHLORIDE 0.9 % IV SOLN
INTRAVENOUS | Status: DC
Start: 2017-09-11 — End: 2017-09-12

## 2017-09-11 MED ORDER — ASPIRIN EC 81 MG PO TBEC
81.0000 mg | DELAYED_RELEASE_TABLET | Freq: Every day | ORAL | Status: DC
  Administered 2017-09-11: 81 mg via ORAL
  Filled 2017-09-11: qty 1

## 2017-09-11 MED ORDER — ACETAMINOPHEN 325 MG RE SUPP: 325.0000 mg | RECTAL | Status: DC | PRN

## 2017-09-11 MED ORDER — FLEET ENEMA 7-19 GM/118ML RE ENEM: 1.0000 | ENEMA | Freq: Once | RECTAL | Status: DC | PRN

## 2017-09-11 MED ORDER — CEFAZOLIN SODIUM-DEXTROSE 2-4 GM/100ML-% IV SOLN
INTRAVENOUS | Status: AC
  Filled 2017-09-11: qty 100

## 2017-09-11 MED ORDER — ENOXAPARIN SODIUM 40 MG/0.4ML ~~LOC~~ SOLN: 40.0000 mg | SUBCUTANEOUS | Status: DC

## 2017-09-11 MED ORDER — LIDOCAINE HCL (CARDIAC) 20 MG/ML IV SOLN
INTRAVENOUS | Status: DC | PRN
  Administered 2017-09-11: 50 mg via INTRAVENOUS

## 2017-09-11 MED ORDER — CEFAZOLIN SODIUM-DEXTROSE 2-4 GM/100ML-% IV SOLN
2.0000 g | Freq: Three times a day (TID) | INTRAVENOUS | Status: AC
  Administered 2017-09-11: 2 g via INTRAVENOUS
  Filled 2017-09-11 (×2): qty 100

## 2017-09-11 MED ORDER — SODIUM CHLORIDE 0.9 % IV SOLN: 500.0000 mL | Freq: Once | INTRAVENOUS | Status: DC | PRN

## 2017-09-11 MED ORDER — PHENYLEPHRINE 40 MCG/ML (10ML) SYRINGE FOR IV PUSH (FOR BLOOD PRESSURE SUPPORT)
PREFILLED_SYRINGE | INTRAVENOUS | Status: AC
  Filled 2017-09-11: qty 10

## 2017-09-11 MED ORDER — ALUM & MAG HYDROXIDE-SIMETH 200-200-20 MG/5ML PO SUSP: 15.0000 mL | ORAL | Status: DC | PRN

## 2017-09-11 MED ORDER — EZETIMIBE 10 MG PO TABS
10.0000 mg | ORAL_TABLET | Freq: Every day | ORAL | Status: DC
  Administered 2017-09-11 – 2017-09-12 (×2): 10 mg via ORAL
  Filled 2017-09-11 (×2): qty 1

## 2017-09-11 MED ORDER — SENNOSIDES-DOCUSATE SODIUM 8.6-50 MG PO TABS: 1.0000 | ORAL_TABLET | Freq: Every evening | ORAL | Status: DC | PRN

## 2017-09-11 MED ORDER — ONDANSETRON HCL 4 MG/2ML IJ SOLN
INTRAMUSCULAR | Status: DC | PRN
  Administered 2017-09-11: 4 mg via INTRAVENOUS

## 2017-09-11 MED ORDER — BISACODYL 5 MG PO TBEC: 5.0000 mg | DELAYED_RELEASE_TABLET | Freq: Every day | ORAL | Status: DC | PRN

## 2017-09-11 MED ORDER — PANTOPRAZOLE SODIUM 40 MG PO TBEC
40.0000 mg | DELAYED_RELEASE_TABLET | Freq: Every day | ORAL | Status: DC
  Administered 2017-09-11 – 2017-09-12 (×2): 40 mg via ORAL
  Filled 2017-09-11 (×2): qty 1

## 2017-09-11 MED ORDER — OXYCODONE HCL 5 MG PO TABS
5.0000 mg | ORAL_TABLET | ORAL | Status: DC | PRN
  Filled 2017-09-11: qty 1

## 2017-09-11 MED ORDER — ATORVASTATIN CALCIUM 40 MG PO TABS
40.0000 mg | ORAL_TABLET | Freq: Every evening | ORAL | Status: DC
  Administered 2017-09-11: 40 mg via ORAL
  Filled 2017-09-11: qty 1

## 2017-09-11 MED ORDER — DEXAMETHASONE SODIUM PHOSPHATE 10 MG/ML IJ SOLN
INTRAMUSCULAR | Status: DC | PRN
  Administered 2017-09-11: 10 mg via INTRAVENOUS

## 2017-09-11 MED ORDER — ONDANSETRON HCL 4 MG/2ML IJ SOLN
INTRAMUSCULAR | Status: AC
  Filled 2017-09-11: qty 2

## 2017-09-11 MED ORDER — HEPARIN SODIUM (PORCINE) 1000 UNIT/ML IJ SOLN
INTRAMUSCULAR | Status: DC | PRN
  Administered 2017-09-11: 6000 [IU] via INTRAVENOUS
  Administered 2017-09-11: 3000 [IU] via INTRAVENOUS

## 2017-09-11 MED ORDER — PHENOL 1.4 % MT LIQD: 1.0000 | OROMUCOSAL | Status: DC | PRN

## 2017-09-11 SURGICAL SUPPLY — 72 items
ADH SKN CLS APL DERMABOND .7 (GAUZE/BANDAGES/DRESSINGS) ×1
BAG SNAP BAND KOVER 36X36 (MISCELLANEOUS) ×2 IMPLANT
BLADE CLIPPER SURG (BLADE) ×3 IMPLANT
BLADE SURG 12 STRL SS (BLADE) ×2 IMPLANT
CANISTER SUCT 3000ML PPV (MISCELLANEOUS) ×3 IMPLANT
CATH ANGIO 5F BER2 65CM (CATHETERS) IMPLANT
CATH BEACON 5 .035 65 KMP TIP (CATHETERS) ×2 IMPLANT
CATH BEACON 5.038 65CM KMP-01 (CATHETERS) IMPLANT
CATH OMNI FLUSH .035X70CM (CATHETERS) ×2 IMPLANT
COVER DOME SNAP 22 D (MISCELLANEOUS) ×2 IMPLANT
COVER PROBE W GEL 5X96 (DRAPES) ×3 IMPLANT
DERMABOND ADVANCED (GAUZE/BANDAGES/DRESSINGS) ×2
DERMABOND ADVANCED .7 DNX12 (GAUZE/BANDAGES/DRESSINGS) ×1 IMPLANT
DEVICE CLOSURE PERCLS PRGLD 6F (VASCULAR PRODUCTS) IMPLANT
DRAPE ZERO GRAVITY STERILE (DRAPES) ×5 IMPLANT
DRSG TEGADERM 2-3/8X2-3/4 SM (GAUZE/BANDAGES/DRESSINGS) ×4 IMPLANT
DRYSEAL FLEXSHEATH 20FR 33CM (SHEATH) ×2
ELECT CAUTERY BLADE 6.4 (BLADE) ×3 IMPLANT
ELECT REM PT RETURN 9FT ADLT (ELECTROSURGICAL) ×6
ELECTRODE REM PT RTRN 9FT ADLT (ELECTROSURGICAL) ×2 IMPLANT
ENDOPROSTHESIS THORAC 26X21X10 (Endovascular Graft) IMPLANT
ENDOPROTH THORACIC 26X21X10 (Endovascular Graft) ×3 IMPLANT
GAUZE SPONGE 2X2 8PLY STRL LF (GAUZE/BANDAGES/DRESSINGS) ×2 IMPLANT
GLOVE BIO SURGEON STRL SZ 6.5 (GLOVE) ×1 IMPLANT
GLOVE BIO SURGEON STRL SZ7 (GLOVE) ×2 IMPLANT
GLOVE BIO SURGEONS STRL SZ 6.5 (GLOVE) ×1
GLOVE BIOGEL PI IND STRL 6.5 (GLOVE) IMPLANT
GLOVE BIOGEL PI IND STRL 7.0 (GLOVE) IMPLANT
GLOVE BIOGEL PI IND STRL 7.5 (GLOVE) ×1 IMPLANT
GLOVE BIOGEL PI INDICATOR 6.5 (GLOVE) ×4
GLOVE BIOGEL PI INDICATOR 7.0 (GLOVE) ×2
GLOVE BIOGEL PI INDICATOR 7.5 (GLOVE) ×8
GLOVE ECLIPSE 7.0 STRL STRAW (GLOVE) ×2 IMPLANT
GLOVE SURG SS PI 7.5 STRL IVOR (GLOVE) ×3 IMPLANT
GOWN STRL REUS W/ TWL LRG LVL3 (GOWN DISPOSABLE) ×2 IMPLANT
GOWN STRL REUS W/ TWL XL LVL3 (GOWN DISPOSABLE) ×1 IMPLANT
GOWN STRL REUS W/TWL LRG LVL3 (GOWN DISPOSABLE) ×6
GOWN STRL REUS W/TWL XL LVL3 (GOWN DISPOSABLE) ×3
GRAFT BALLN CATH 65CM (STENTS) IMPLANT
HEMOSTAT SNOW SURGICEL 2X4 (HEMOSTASIS) IMPLANT
KIT BASIN OR (CUSTOM PROCEDURE TRAY) ×3 IMPLANT
KIT TURNOVER KIT B (KITS) ×3 IMPLANT
LEG CONTRALATERAL 23X12 (Endovascular Graft) ×2 IMPLANT
NDL PERC 18GX7CM (NEEDLE) ×1 IMPLANT
NEEDLE PERC 18GX7CM (NEEDLE) ×3 IMPLANT
NS IRRIG 1000ML POUR BTL (IV SOLUTION) ×3 IMPLANT
PACK ENDOVASCULAR (PACKS) ×3 IMPLANT
PAD ARMBOARD 7.5X6 YLW CONV (MISCELLANEOUS) ×6 IMPLANT
PENCIL BUTTON HOLSTER BLD 10FT (ELECTRODE) ×5 IMPLANT
PERCLOSE PROGLIDE 6F (VASCULAR PRODUCTS) ×12
SHEATH AVANTI 11CM 8FR (SHEATH) IMPLANT
SHEATH BRITE TIP 8FR 23CM (SHEATH) ×2 IMPLANT
SHEATH DRYSEAL FLEX 20FR 33CM (SHEATH) IMPLANT
SHEATH PINNACLE 8F 10CM (SHEATH) ×2 IMPLANT
SHIELD RADPAD SCOOP 12X17 (MISCELLANEOUS) ×8 IMPLANT
SPONGE GAUZE 2X2 STER 10/PKG (GAUZE/BANDAGES/DRESSINGS) ×2
STENT GRAFT BALLN CATH 65CM (STENTS) ×2
STOPCOCK MORSE 400PSI 3WAY (MISCELLANEOUS) ×3 IMPLANT
SUT ETHILON 3 0 PS 1 (SUTURE) IMPLANT
SUT PROLENE 5 0 C 1 24 (SUTURE) IMPLANT
SUT VIC AB 2-0 CT1 27 (SUTURE)
SUT VIC AB 2-0 CT1 TAPERPNT 27 (SUTURE) IMPLANT
SUT VIC AB 3-0 SH 27 (SUTURE)
SUT VIC AB 3-0 SH 27X BRD (SUTURE) IMPLANT
SUT VICRYL 4-0 PS2 18IN ABS (SUTURE) IMPLANT
SYR 30ML LL (SYRINGE) ×3 IMPLANT
TOWEL GREEN STERILE (TOWEL DISPOSABLE) ×3 IMPLANT
TRAY FOLEY MTR SLVR 16FR STAT (SET/KITS/TRAYS/PACK) ×3 IMPLANT
TUBING HIGH PRESSURE 120CM (CONNECTOR) ×5 IMPLANT
WIRE AMPLATZ SS-J .035X180CM (WIRE) IMPLANT
WIRE AMPLATZ SS-J .035X260CM (WIRE) ×4 IMPLANT
WIRE BENTSON .035X145CM (WIRE) ×4 IMPLANT

## 2017-09-11 NOTE — Progress Notes (Signed)
Pt started Cipro ABX 2 days ago (out of a 10 day course) for abnormal UA. Repeat UA ordered, pt currently unable to void as he was NPO for surgery and voided prior to arrival. Will need repeat UA post op. Pt aware.

## 2017-09-11 NOTE — Anesthesia Procedure Notes (Signed)
Procedure Name: Intubation Date/Time: 09/11/2017 7:52 AM Performed by: Lance Coon, CRNA Pre-anesthesia Checklist: Patient identified, Emergency Drugs available, Suction available and Patient being monitored Patient Re-evaluated:Patient Re-evaluated prior to induction Oxygen Delivery Method: Circle System Utilized and Circle system utilized Preoxygenation: Pre-oxygenation with 100% oxygen Induction Type: IV induction Ventilation: Mask ventilation without difficulty Laryngoscope Size: Mac and 3 Grade View: Grade II Tube type: Oral Number of attempts: 1 Airway Equipment and Method: Stylet and Oral airway Placement Confirmation: ETT inserted through vocal cords under direct vision,  positive ETCO2 and breath sounds checked- equal and bilateral Secured at: 22 cm Tube secured with: Tape Dental Injury: Teeth and Oropharynx as per pre-operative assessment and Injury to lip

## 2017-09-11 NOTE — Interval H&P Note (Signed)
History and Physical Interval Note:  09/11/2017 7:28 AM  Stephen [name]  has presented today for surgery, with the diagnosis of ABDOMINAL AORTIC ANEURYSM  The various methods of treatment have been discussed with the patient and family. After consideration of risks, benefits and other options for treatment, the patient has consented to  Procedure(s): ABDOMINAL AORTIC ENDOVASCULAR STENT GRAFT (N/A) as a surgical intervention .  The patient's history has been reviewed, patient examined, no change in status, stable for surgery.  I have reviewed the patient's chart and labs.  Questions were answered to the patient's satisfaction.     Annamarie Major

## 2017-09-11 NOTE — Transfer of Care (Signed)
Immediate Anesthesia Transfer of Care Note  Patient: Stephen [name]  Procedure(s) Performed: ABDOMINAL AORTIC ENDOVASCULAR STENT GRAFT (N/A Abdomen)  Patient Location: PACU  Anesthesia Type:General  Level of Consciousness: awake and patient cooperative  Airway & Oxygen Therapy: Patient Spontanous Breathing  Post-op Assessment: Report given to RN and Post -op Vital signs reviewed and stable  Post vital signs: Reviewed and stable  Last Vitals:  Vitals Value Taken Time  BP 153/79 09/11/2017  9:19 AM  Temp    Pulse 58 09/11/2017  9:20 AM  Resp 9 09/11/2017  9:20 AM  SpO2 98 % 09/11/2017  9:20 AM  Vitals shown include unvalidated device data.  Last Pain:  Vitals:   09/11/17 0557  TempSrc: Oral  PainSc:       Patients Stated Pain Goal: 2 (88/64/84 7207)  Complications: No apparent anesthesia complications

## 2017-09-11 NOTE — Discharge Instructions (Signed)
   [facility] [place]   Discharge Instructions  Endovascular Aortic Aneurysm Repair  Please refer to the following instructions for your post-procedure care. Your surgeon or Physician Assistant will discuss any changes with you.  Activity  You are encouraged to walk as much as you can. You can slowly return to normal activities but must avoid strenuous activity and heavy lifting until your doctor tells you it's OK. Avoid activities such as vacuuming or swinging a gold club. It is normal to feel tired for several weeks after your surgery. Do not drive until your doctor gives the OK and you are no longer taking prescription pain medications. It is also normal to have difficulty with sleep habits, eating, and bowel movements after surgery. These will go away with time.  Bathing/Showering  You may shower after you go home. If you have an incision, do not soak in a bathtub, hot tub, or swim until the incision heals completely.  Incision Care  Shower every day. Clean your incision with mild soap and water. Pat the area dry with a clean towel. You do not need a bandage unless otherwise instructed. Do not apply any ointments or creams to your incision. If you clothing is irritating, you may cover your incision with a dry gauze pad.  Diet  Resume your normal diet. There are no special food restrictions following this procedure. A low fat/low cholesterol diet is recommended for all patients with vascular disease. In order to heal from your surgery, it is CRITICAL to get adequate nutrition. Your body requires vitamins, minerals, and protein. Vegetables are the best source of vitamins and minerals. Vegetables also provide the perfect balance of protein. Processed food has little nutritional value, so try to avoid this.  Medications  Resume taking all of your medications unless your doctor or nurse practitioner tells you not to. If your incision is causing pain, you may take  over-the-counter pain relievers such as acetaminophen (Tylenol). If you were prescribed a stronger pain medication, please be aware these medications can cause nausea and constipation. Prevent nausea by taking the medication with a snack or meal. Avoid constipation by drinking plenty of fluids and eating foods with a high amount of fiber, such as fruits, vegetables, and grains. Do not take Tylenol if you are taking prescription pain medications.   Follow up  Our office will schedule a follow-up appointment with a C.T. scan 3-4 weeks after your surgery.  Please call us immediately for any of the following conditions  Severe or worsening pain in your legs or feet or in your abdomen back or chest. Increased pain, redness, drainage (pus) from your incision sit. Increased abdominal pain, bloating, nausea, vomiting or persistent diarrhea. Fever of 101 degrees or higher. Swelling in your leg (s),  Reduce your risk of vascular disease  Stop smoking. If you would like help call [facility] at 1-800-QUIT-NOW (1-800-784-8669) or [place] at [phone]. Manage your cholesterol Maintain a desired weight Control your diabetes Keep your blood pressure down  If you have questions, please call the office at [phone].   

## 2017-09-11 NOTE — Anesthesia Procedure Notes (Signed)
Arterial Line Insertion Start/End4/05/2018 7:00 AM, 09/11/2017 7:10 AM Performed by: Lance Coon, CRNA, CRNA  Patient location: Pre-op. Preanesthetic checklist: patient identified, IV checked, site marked, risks and benefits discussed, surgical consent, monitors and equipment checked, pre-op evaluation, timeout performed and anesthesia consent Lidocaine 1% used for infiltration Left, radial was placed Catheter size: 20 G Hand hygiene performed , maximum sterile barriers used  and Seldinger technique used  Attempts: 2 Procedure performed without using ultrasound guided technique. Following insertion, Biopatch and dressing applied. Post procedure assessment: normal  Patient tolerated the procedure well with no immediate complications.

## 2017-09-11 NOTE — Op Note (Signed)
Patient name: Stephen [name] MRN: [id number] DOB: Apr 21, 1941 Sex: male  09/11/2017 Pre-operative Diagnosis: AAA Post-operative diagnosis:  Same Surgeon:  Annamarie Major Assistants:  Laurence Slate Procedure:   1: Endovascular repair of a infrarenal abdominal aortic aneurysm using an aorto-uni-Iliac device   #2: Abdominal aortogram   #3: Bilateral ultrasound-guided percutaneous access Anesthesia: General minimal none Blood Loss:  minimal Specimens:  none  Findings:  Complete exclusion with type II endoleak  Indications: I have been following the patient for a infrarenal abdominal aortic aneurysm.  Is now 5 cm and the patient strongly wants repair.  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  Devices used: Proximal piece is a Agricultural engineer 24 x 21 x 10.  Distal piece is a iliac lymph Gore 23 x 12  Procedure:  The patient was identified in the holding area and taken to [place] [facility]  The patient was then placed supine on the table. general anesthesia was administered.  The patient was prepped and draped in the usual sterile fashion.  A time out was called and antibiotics were administered.  Ultrasound was used to evaluate the left common femoral artery.  It was widely patent with minimal calcification.  A #11 blade was used to make a skin nick.  Left common femoral artery was then cannulated under ultrasound guidance with an 18-gauge needle.  An 035 wire was advanced using the aid of a Kumpe catheter into the aorta.  The subcutaneous tract was dilated with 8 [place] dilator.  Probe glide devices were placed at the 11:00 and 1 o'clock position for pre-closure an 8 French sheath was placed.  The patient was fully heparinized.  Using a Kumpe catheter and a Berenstein wire, access was gained into the descending thoracic aorta.  The Bentson wire was removed and a Amplatz superstiff wire was placed.  Next, a 29 French sheath was advanced into the  iliac system.  The image detector was rotated to a right anterior oblique position and a retrograde injection was performed through the sheath locating the left hypogastric artery.  The initial piece was then prepared on the back table and inserted.  This was a Gore iliac limb 23 x 12 device.  It was then deployed landing at the level of the left hypogastric artery.  Next the dilator for the sheath was then inserted and the sheath was advanced into the abdominal aorta.  The proximal piece was then prepared on the back table.  This was a Gore CTAG 24 x 21 x 10 device.  It was advanced in the sheath and positioned at the level of L1.  A second access through the dry seal sheath was obtained with a 8 [place] dilator and a Bentson wire.  Over the wire and a Omni Flush catheter was advanced to the level of L1.  15 degrees cranial projection was placed.  An abdominal aortogram was performed was located bilateral renal arteries.  Next, the device was then deployed.  I used a MOB balloon to mold the device including proximal and distal attachment sites as well as device overlap.  A completion arteriogram was then performed which showed successful exclusion of the aneurysm sac with preservation of blood flow through the left hypogastric artery as well as bilateral renal arteries.  There was a late type II endoleak.  I was satisfied with these results.  The Amplatz wire was exchanged out for  Bentson wire and the sheath was removed.  The arteriotomy was secured by closing the probe glide devices.  50 mg of protamine was then given.  Once everything was hemostatic, cautery was used to reapproximate the subtendinous tissue.  The skin was reapproximated with Dermabond.  Patient had brisk Doppler signals in both the dorsalis pedis and posterior tibial arteries.  There were no immediate complications.  He was successfully extubated taken recovery room in stable condition.   Disposition: To PACU stable   V. Annamarie Major,  M.D. [facility] [place] Office: [phone] Pager:  [phone]

## 2017-09-11 NOTE — Anesthesia Postprocedure Evaluation (Signed)
Anesthesia Post Note  Patient: Stephen [name]  Procedure(s) Performed: ABDOMINAL AORTIC ENDOVASCULAR STENT GRAFT (N/A Abdomen)     Patient location during evaluation: PACU Anesthesia Type: General Level of consciousness: awake and alert Pain management: pain level controlled Vital Signs Assessment: post-procedure vital signs reviewed and stable Respiratory status: spontaneous breathing, nonlabored ventilation and respiratory function stable Cardiovascular status: blood pressure returned to baseline and stable Postop Assessment: no apparent nausea or vomiting Anesthetic complications: no    Last Vitals:  Vitals:   09/11/17 1047 09/11/17 1117  BP: (!) 151/76 (!) 161/80  Pulse: (!) 50 (!) 59  Resp: 10 11  Temp:  36.4 C  SpO2: 99% 99%    Last Pain:  Vitals:   09/11/17 1050  TempSrc:   PainSc: 0-No pain    LLE Motor Response: Purposeful movement (09/11/17 1120) LLE Sensation: No numbness;No pain;No tingling;Full sensation (09/11/17 1120)          Aariz Maish,W. EDMOND

## 2017-09-12 LAB — BASIC METABOLIC PANEL
ANION GAP: 8 (ref 5–15)
BUN: 24 mg/dL — ABNORMAL HIGH (ref 6–20)
CHLORIDE: 112 mmol/L — AB (ref 101–111)
CO2: 18 mmol/L — AB (ref 22–32)
Calcium: 8.2 mg/dL — ABNORMAL LOW (ref 8.9–10.3)
Creatinine, Ser: 1.33 mg/dL — ABNORMAL HIGH (ref 0.61–1.24)
GFR calc Af Amer: 58 mL/min — ABNORMAL LOW (ref >60)
GFR calc non Af Amer: 50 mL/min — ABNORMAL LOW (ref >60)
GLUCOSE: 135 mg/dL — AB (ref 65–99)
POTASSIUM: 3.6 mmol/L (ref 3.5–5.1)
Sodium: 138 mmol/L (ref 135–145)

## 2017-09-12 LAB — POCT ACTIVATED CLOTTING TIME
Activated Clotting Time: 125 seconds
Activated Clotting Time: 268 seconds

## 2017-09-12 LAB — CBC
HEMATOCRIT: 29.8 % — AB (ref 39.0–52.0)
HEMOGLOBIN: 10 g/dL — AB (ref 13.0–17.0)
MCH: 33.7 pg (ref 26.0–34.0)
MCHC: 33.6 g/dL (ref 30.0–36.0)
MCV: 100.3 fL — AB (ref 78.0–100.0)
Platelets: 89 10*3/uL — ABNORMAL LOW (ref 150–400)
RBC: 2.97 MIL/uL — AB (ref 4.22–5.81)
RDW: 12.9 % (ref 11.5–15.5)
WBC: 6.6 10*3/uL (ref 4.0–10.5)

## 2017-09-12 NOTE — Progress Notes (Addendum)
     Subjective  - Doing well without complaints.   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  Patent  Graft with palpable left DP.  Tolerating PO's, mobility at baseline and independent voided. Disposition stable for discharge home.  F/U in 4 weeks with CTA abd and pelvis. Slight increase in Cr 1.33 will encourage fluid intake.     Roxy Horseman 09/12/2017 9:17 AM --  Laboratory Lab Results: Recent Labs    09/11/17 0911 09/12/17 0444  WBC 6.0 6.6  HGB 10.6* 10.0*  HCT 32.0* 29.8*  PLT 100* 89*   BMET Recent Labs    09/11/17 0911 09/12/17 0444  NA 141 138  K 4.1 3.6  CL 114* 112*  CO2 19* 18*  GLUCOSE 99 135*  BUN 23* 24*  CREATININE 1.21 1.33*  CALCIUM 8.5* 8.2*    COAG Lab Results  Component Value Date   INR 1.44 09/11/2017   INR 1.26 09/08/2017   INR 1.22 01/05/2014   No results found for: PTT  I have independently interviewed and examined patient and agree with PA assessment and plan above.   Emmett Bracknell C. Donzetta Matters, MD [facility] [place] Office: [phone] Pager: [phone]

## 2017-09-14 ENCOUNTER — Encounter (HOSPITAL_COMMUNITY): Payer: Self-pay | Admitting: Surgery
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
## 2017-09-15 ENCOUNTER — Telehealth: Payer: Self-pay | Admitting: *Deleted

## 2017-09-15 NOTE — Telephone Encounter (Signed)
Call this am to check on patient. States hiccups are gone. Felling good. 80% better by last night and all gone this am. Just moved around and deep breathing yesterday.

## 2017-09-16 ENCOUNTER — Telehealth: Payer: Self-pay | Admitting: Surgery

## 2017-09-16 NOTE — Telephone Encounter (Signed)
Spoke to pt for appts 5/13 CTA and OV here  [place]

## 2017-09-16 NOTE — Telephone Encounter (Signed)
-----   Message from Mena Goes, RN sent at 09/11/2017  9:25 AM EDT ----- Regarding: 4 weeks post EVAR with CTA   ----- Message ----- From: Ulyses Amor, PA-C Sent: 09/11/2017   9:16 AM To: Vvs Charge Pool  S/P EVAR needs CTA Abd/Pe;vis for follow up in 4 weeks [place]

## 2017-09-17 NOTE — Consult Note (Signed)
            [facility] CM Primary Care Navigator  09/17/2017  Stephen [name] 04/20/1941 [id number]   Attempt toseepatient at the bedside to identify possible discharge needsbuthe was already dischargehome.  Per chart review, patient was admitted for abdominal aortic aneurysm (underwent abdominal aortic endovascular stent graft).  Primary care provider's officeis listed as providingtransition of care (TOC)follow-up.   Patient also has discharge instruction to follow-up withvascular surgery, cardiology in  4 weeks.   For additional questions please contact:  Edwena Felty A. Rochell Puett, BSN, RN-BC [facility] PRIMARY CARE Navigator Cell: [phone]

## 2017-09-21 ENCOUNTER — Ambulatory Visit (INDEPENDENT_AMBULATORY_CARE_PROVIDER_SITE_OTHER): Payer: Medicare Other | Admitting: Internal Medicine

## 2017-09-21 ENCOUNTER — Encounter: Payer: Self-pay | Admitting: Internal Medicine

## 2017-09-21 VITALS — BP 114/80 | HR 70 | Ht 68.0 in | Wt 134.0 lb

## 2017-09-21 DIAGNOSIS — I255 Ischemic cardiomyopathy: Secondary | ICD-10-CM | POA: Diagnosis not present

## 2017-09-21 DIAGNOSIS — I2510 Atherosclerotic heart disease of native coronary artery without angina pectoris: Secondary | ICD-10-CM

## 2017-09-21 MED ORDER — SPIRONOLACTONE 25 MG PO TABS: 12.5000 mg | ORAL_TABLET | Freq: Every day | ORAL | 3 refills | Status: DC

## 2017-09-21 NOTE — Patient Instructions (Signed)
Medication Instructions:  Your physician has recommended you make the following change in your medication:   1. Begin Aldactone (spironolactone) 12.5mg , by mouth, once daily  Labwork: Your physician has recommended you have a BMP drawn today  Testing/Procedures: Your physician has requested that you have an echocardiogram in 3 months. Echocardiography is a painless test that uses sound waves to create images of your heart. It provides your doctor with information about the size and shape of your heart and how well your heart's chambers and valves are working. This procedure takes approximately one hour. There are no restrictions for this procedure.    Follow-Up: Your physician recommends that you schedule a follow-up appointment in:   3 months with a [facility] PA or NP for possible Entresto start.   Any Other Special Instructions Will Be Listed Below (If Applicable).     If you need a refill on your cardiac medications before your next appointment, please call your pharmacy.
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
## 2017-09-22 LAB — BASIC METABOLIC PANEL
BUN / CREAT RATIO: 17 (ref 10–24)
BUN: 22 mg/dL (ref 8–27)
CHLORIDE: 106 mmol/L (ref 96–106)
CO2: 21 mmol/L (ref 20–29)
CREATININE: 1.33 mg/dL — AB (ref 0.76–1.27)
Calcium: 9.3 mg/dL (ref 8.6–10.2)
GFR calc non Af Amer: 51 mL/min/{1.73_m2} — ABNORMAL LOW (ref >59)
GFR, EST AFRICAN AMERICAN: 59 mL/min/{1.73_m2} — AB (ref >59)
Glucose: 87 mg/dL (ref 65–99)
Potassium: 4.8 mmol/L (ref 3.5–5.2)
Sodium: 142 mmol/L (ref 134–144)

## 2017-09-23 ENCOUNTER — Telehealth: Payer: Self-pay | Admitting: Internal Medicine

## 2017-09-23 DIAGNOSIS — I255 Ischemic cardiomyopathy: Secondary | ICD-10-CM

## 2017-09-23 DIAGNOSIS — Z79899 Other long term (current) drug therapy: Secondary | ICD-10-CM

## 2017-09-23 NOTE — Telephone Encounter (Signed)
The patient's wife is aware of his lab results (ok per DPR). She states the patient is currently out of town, but did start aldactone this week. I advised her the patient will need to come in next week to have his BMP rechecked due to initiation of the aldactone. She did not know when he will be back, but felt later in the week would be good for him. She is aware I will put the patient on the schedule for Friday 10/02/17 for repeat labs. She is aware he may come in any time on 5/3 and does not have to be fasting. She voices understanding of the above.

## 2017-10-02 ENCOUNTER — Other Ambulatory Visit: Payer: Medicare Other | Admitting: *Deleted

## 2017-10-02 DIAGNOSIS — Z79899 Other long term (current) drug therapy: Secondary | ICD-10-CM

## 2017-10-02 DIAGNOSIS — I255 Ischemic cardiomyopathy: Secondary | ICD-10-CM | POA: Diagnosis not present

## 2017-10-02 LAB — BASIC METABOLIC PANEL
BUN / CREAT RATIO: 18 (ref 10–24)
BUN: 24 mg/dL (ref 8–27)
CHLORIDE: 106 mmol/L (ref 96–106)
CO2: 23 mmol/L (ref 20–29)
Calcium: 8.9 mg/dL (ref 8.6–10.2)
Creatinine, Ser: 1.32 mg/dL — ABNORMAL HIGH (ref 0.76–1.27)
GFR calc Af Amer: 60 mL/min/{1.73_m2} (ref >59)
GFR calc non Af Amer: 52 mL/min/{1.73_m2} — ABNORMAL LOW (ref >59)
Glucose: 104 mg/dL — ABNORMAL HIGH (ref 65–99)
POTASSIUM: 4.6 mmol/L (ref 3.5–5.2)
SODIUM: 142 mmol/L (ref 134–144)

## 2017-10-12 ENCOUNTER — Ambulatory Visit (INDEPENDENT_AMBULATORY_CARE_PROVIDER_SITE_OTHER): Payer: Self-pay | Admitting: Surgery

## 2017-10-12 ENCOUNTER — Encounter: Payer: Self-pay | Admitting: Surgery

## 2017-10-12 ENCOUNTER — Other Ambulatory Visit: Payer: Self-pay

## 2017-10-12 ENCOUNTER — Ambulatory Visit
Admission: RE | Admit: 2017-10-12 | Discharge: 2017-10-12 | Disposition: A | Discharge status: Home / Self Care | Payer: Medicare Other | Source: Ambulatory Visit | Attending: Surgery | Admitting: Surgery

## 2017-10-12 ENCOUNTER — Encounter: Payer: Self-pay | Admitting: *Deleted

## 2017-10-12 ENCOUNTER — Other Ambulatory Visit: Payer: Self-pay | Admitting: *Deleted

## 2017-10-12 VITALS — BP 153/89 | HR 59 | Temp 97.5°F | Resp 20 | Ht 68.0 in | Wt 131.0 lb

## 2017-10-12 DIAGNOSIS — Z48812 Encounter for surgical aftercare following surgery on the circulatory system: Secondary | ICD-10-CM

## 2017-10-12 DIAGNOSIS — I714 Abdominal aortic aneurysm, without rupture, unspecified: Secondary | ICD-10-CM

## 2017-10-12 MED ORDER — IOPAMIDOL (ISOVUE-370) INJECTION 76%
75.0000 mL | Freq: Once | INTRAVENOUS | Status: AC | PRN
  Administered 2017-10-12: 75 mL via INTRAVENOUS

## 2017-10-12 NOTE — Progress Notes (Signed)
Patient name: Stephen [name] MRN: [id number] DOB: Jun 06, 1940 Sex: male  REASON FOR VISIT:     post op  HISTORY OF PRESENT ILLNESS:   Stephen [name] is a 77 y.o. male who is status post endovascular repair of a 6.1 cm abdominal aortic aneurysm on 09/11/2017.  His postop course uncomplicated.  He has no complaints today  Patient is status post MI in 2013. He had PCI intervention at that time. He has a history of hypercholesterolemia which is treated with a statin. He has a history of chronic hepatitis C. He has been treated with Harvoni. He has had complete resolution. He is medicallymanaged for hypertension.he is a smoker.   CURRENT MEDICATIONS:    Current Outpatient Medications  Medication Sig Dispense Refill  . aspirin EC 81 MG tablet Take 81 mg by mouth at bedtime.    [name] atorvastatin (LIPITOR) 40 MG tablet Take 1 tablet (40 mg) by mouth daily in the evening    . carvedilol (COREG) 12.5 MG tablet TAKE 1 TABLET (12.5 MG TOTAL) BY MOUTH 2 (TWO) TIMES DAILY WITH A MEAL. 180 tablet 2  . ezetimibe (ZETIA) 10 MG tablet Take 1 tablet (10 mg) by mouth daily in the evening    . Multiple Vitamin (MULITIVITAMIN WITH MINERALS) TABS Take 1 tablet by mouth daily.    . nitroGLYCERIN (NITROSTAT) 0.4 MG SL tablet Place 1 tablet (0.4 mg total) under the tongue every 5 (five) minutes as needed for chest pain. 25 tablet 3  . ramipril (ALTACE) 10 MG capsule Take 1 capsule (10 mg total) by mouth daily. 90 capsule 1  . spironolactone (ALDACTONE) 25 MG tablet Take 0.5 tablets (12.5 mg total) by mouth daily. 45 tablet 3   Current Facility-Administered Medications  Medication Dose Route Frequency Provider Last Rate Last Dose  . 0.9 %  sodium chloride infusion  500 mL Intravenous Continuous Irene Shipper, MD        REVIEW OF SYSTEMS:   [X]  denotes positive finding, [ ]  denotes negative finding Cardiac  Comments:  Chest pain or chest pressure:    Shortness of  breath upon exertion:    Short of breath when lying flat:    Irregular heart rhythm:    Constitutional    Fever or chills:      PHYSICAL EXAM:   Vitals:   10/12/17 1521  BP: (!) 153/89  Pulse: (!) 59  Resp: 20  Temp: (!) 97.5 F (36.4 C)  TempSrc: Oral  SpO2: 98%  Weight: 131 lb (59.4 kg)  Height: 5\' 8"  (1.727 m)    GENERAL: The patient is a well-nourished male, in no acute distress. The vital signs are documented above. CARDIOVASCULAR: There is a regular rate and rhythm. PULMONARY: Non-labored respirations Left groin incision completely healed  STUDIES:   CTA:  1. Patent aortic stent graft with single limb to the left proximal common iliac artery. 2. Combined type 1/type 2 endoleak related to the distal landing zone and patent IMA. 3. 6.3 cm abdominal aortic aneurysm (previously 6.1) with stable contiguous occluded fusiform 4.5 cm right common iliac artery aneurysm.   MEDICAL ISSUES:   Possible type Ib versus type II endoleak with slight increase in size of aneurysm sac measuring 6.3 cm.  I discussed with the patient that I need to determine the source of the endoleak.  I think the best way to do this is through a diagnostic arteriogram.  I think this would be best done through the left brachial  approach as scheduled this for May 28  Annamarie Major, MD [facility] [phone] Pager [phone]

## 2017-10-12 NOTE — H&P (View-Only) (Signed)
Patient name: Stephen [name] MRN: [id number] DOB: March 03, 1941 Sex: male  REASON FOR VISIT:     post op  HISTORY OF PRESENT ILLNESS:   Stephen [name] is a 77 y.o. male who is status post endovascular repair of a 6.1 cm abdominal aortic aneurysm on 09/11/2017.  His postop course uncomplicated.  He has no complaints today  Patient is status post MI in 2013. He had PCI intervention at that time. He has a history of hypercholesterolemia which is treated with a statin. He has a history of chronic hepatitis C. He has been treated with Harvoni. He has had complete resolution. He is medicallymanaged for hypertension.he is a smoker.   CURRENT MEDICATIONS:    Current Outpatient Medications  Medication Sig Dispense Refill  . aspirin EC 81 MG tablet Take 81 mg by mouth at bedtime.    [name] atorvastatin (LIPITOR) 40 MG tablet Take 1 tablet (40 mg) by mouth daily in the evening    . carvedilol (COREG) 12.5 MG tablet TAKE 1 TABLET (12.5 MG TOTAL) BY MOUTH 2 (TWO) TIMES DAILY WITH A MEAL. 180 tablet 2  . ezetimibe (ZETIA) 10 MG tablet Take 1 tablet (10 mg) by mouth daily in the evening    . Multiple Vitamin (MULITIVITAMIN WITH MINERALS) TABS Take 1 tablet by mouth daily.    . nitroGLYCERIN (NITROSTAT) 0.4 MG SL tablet Place 1 tablet (0.4 mg total) under the tongue every 5 (five) minutes as needed for chest pain. 25 tablet 3  . ramipril (ALTACE) 10 MG capsule Take 1 capsule (10 mg total) by mouth daily. 90 capsule 1  . spironolactone (ALDACTONE) 25 MG tablet Take 0.5 tablets (12.5 mg total) by mouth daily. 45 tablet 3   Current Facility-Administered Medications  Medication Dose Route Frequency Provider Last Rate Last Dose  . 0.9 %  sodium chloride infusion  500 mL Intravenous Continuous Irene Shipper, MD        REVIEW OF SYSTEMS:   [X]  denotes positive finding, [ ]  denotes negative finding Cardiac  Comments:  Chest pain or chest pressure:    Shortness of  breath upon exertion:    Short of breath when lying flat:    Irregular heart rhythm:    Constitutional    Fever or chills:      PHYSICAL EXAM:   Vitals:   10/12/17 1521  BP: (!) 153/89  Pulse: (!) 59  Resp: 20  Temp: (!) 97.5 F (36.4 C)  TempSrc: Oral  SpO2: 98%  Weight: 131 lb (59.4 kg)  Height: 5\' 8"  (1.727 m)    GENERAL: The patient is a well-nourished male, in no acute distress. The vital signs are documented above. CARDIOVASCULAR: There is a regular rate and rhythm. PULMONARY: Non-labored respirations Left groin incision completely healed  STUDIES:   CTA:  1. Patent aortic stent graft with single limb to the left proximal common iliac artery. 2. Combined type 1/type 2 endoleak related to the distal landing zone and patent IMA. 3. 6.3 cm abdominal aortic aneurysm (previously 6.1) with stable contiguous occluded fusiform 4.5 cm right common iliac artery aneurysm.   MEDICAL ISSUES:   Possible type Ib versus type II endoleak with slight increase in size of aneurysm sac measuring 6.3 cm.  I discussed with the patient that I need to determine the source of the endoleak.  I think the best way to do this is through a diagnostic arteriogram.  I think this would be best done through the left brachial  approach as scheduled this for May 28  Annamarie Major, MD [facility] [phone] Pager [phone]

## 2017-10-12 NOTE — H&P (View-Only) (Signed)
Patient name: Stephen [name] MRN: [id number] DOB: 1940-08-01 Sex: male  REASON FOR VISIT:     post op  HISTORY OF PRESENT ILLNESS:   Stephen [name] is a 77 y.o. male who is status post endovascular repair of a 6.1 cm abdominal aortic aneurysm on 09/11/2017.  His postop course uncomplicated.  He has no complaints today  Patient is status post MI in 2013. He had PCI intervention at that time. He has a history of hypercholesterolemia which is treated with a statin. He has a history of chronic hepatitis C. He has been treated with Harvoni. He has had complete resolution. He is medicallymanaged for hypertension.he is a smoker.   CURRENT MEDICATIONS:    Current Outpatient Medications  Medication Sig Dispense Refill  . aspirin EC 81 MG tablet Take 81 mg by mouth at bedtime.    [name] atorvastatin (LIPITOR) 40 MG tablet Take 1 tablet (40 mg) by mouth daily in the evening    . carvedilol (COREG) 12.5 MG tablet TAKE 1 TABLET (12.5 MG TOTAL) BY MOUTH 2 (TWO) TIMES DAILY WITH A MEAL. 180 tablet 2  . ezetimibe (ZETIA) 10 MG tablet Take 1 tablet (10 mg) by mouth daily in the evening    . Multiple Vitamin (MULITIVITAMIN WITH MINERALS) TABS Take 1 tablet by mouth daily.    . nitroGLYCERIN (NITROSTAT) 0.4 MG SL tablet Place 1 tablet (0.4 mg total) under the tongue every 5 (five) minutes as needed for chest pain. 25 tablet 3  . ramipril (ALTACE) 10 MG capsule Take 1 capsule (10 mg total) by mouth daily. 90 capsule 1  . spironolactone (ALDACTONE) 25 MG tablet Take 0.5 tablets (12.5 mg total) by mouth daily. 45 tablet 3   Current Facility-Administered Medications  Medication Dose Route Frequency Provider Last Rate Last Dose  . 0.9 %  sodium chloride infusion  500 mL Intravenous Continuous Irene Shipper, MD        REVIEW OF SYSTEMS:   [X]  denotes positive finding, [ ]  denotes negative finding Cardiac  Comments:  Chest pain or chest pressure:    Shortness of  breath upon exertion:    Short of breath when lying flat:    Irregular heart rhythm:    Constitutional    Fever or chills:      PHYSICAL EXAM:   Vitals:   10/12/17 1521  BP: (!) 153/89  Pulse: (!) 59  Resp: 20  Temp: (!) 97.5 F (36.4 C)  TempSrc: Oral  SpO2: 98%  Weight: 131 lb (59.4 kg)  Height: 5\' 8"  (1.727 m)    GENERAL: The patient is a well-nourished male, in no acute distress. The vital signs are documented above. CARDIOVASCULAR: There is a regular rate and rhythm. PULMONARY: Non-labored respirations Left groin incision completely healed  STUDIES:   CTA:  1. Patent aortic stent graft with single limb to the left proximal common iliac artery. 2. Combined type 1/type 2 endoleak related to the distal landing zone and patent IMA. 3. 6.3 cm abdominal aortic aneurysm (previously 6.1) with stable contiguous occluded fusiform 4.5 cm right common iliac artery aneurysm.   MEDICAL ISSUES:   Possible type Ib versus type II endoleak with slight increase in size of aneurysm sac measuring 6.3 cm.  I discussed with the patient that I need to determine the source of the endoleak.  I think the best way to do this is through a diagnostic arteriogram.  I think this would be best done through the left brachial  approach as scheduled this for May 28  Annamarie Major, MD [facility] [phone] Pager [phone]

## 2017-10-24 ENCOUNTER — Encounter: Payer: Self-pay | Admitting: Family Medicine

## 2017-10-25 ENCOUNTER — Other Ambulatory Visit: Payer: Self-pay | Admitting: Cardiovascular Disease

## 2017-10-27 ENCOUNTER — Other Ambulatory Visit: Payer: Self-pay | Admitting: *Deleted

## 2017-10-27 ENCOUNTER — Ambulatory Visit (HOSPITAL_COMMUNITY)
Admission: RE | Admit: 2017-10-27 | Discharge: 2017-10-27 | Disposition: A | Discharge status: Home / Self Care | Payer: Medicare Other | Source: Ambulatory Visit | Attending: Surgery | Admitting: Surgery

## 2017-10-27 ENCOUNTER — Ambulatory Visit (HOSPITAL_COMMUNITY)
Admission: RE | Disposition: A | Discharge status: Home / Self Care | Payer: Self-pay | Source: Ambulatory Visit | Attending: Surgery

## 2017-10-27 ENCOUNTER — Telehealth: Payer: Self-pay | Admitting: *Deleted

## 2017-10-27 ENCOUNTER — Encounter (HOSPITAL_COMMUNITY): Payer: Self-pay | Admitting: Surgery

## 2017-10-27 DIAGNOSIS — Z7982 Long term (current) use of aspirin: Secondary | ICD-10-CM | POA: Insufficient documentation

## 2017-10-27 DIAGNOSIS — E7800 Pure hypercholesterolemia, unspecified: Secondary | ICD-10-CM | POA: Diagnosis not present

## 2017-10-27 DIAGNOSIS — I714 Abdominal aortic aneurysm, without rupture: Secondary | ICD-10-CM | POA: Insufficient documentation

## 2017-10-27 DIAGNOSIS — I10 Essential (primary) hypertension: Secondary | ICD-10-CM | POA: Diagnosis not present

## 2017-10-27 DIAGNOSIS — Z8619 Personal history of other infectious and parasitic diseases: Secondary | ICD-10-CM | POA: Insufficient documentation

## 2017-10-27 DIAGNOSIS — Y832 Surgical operation with anastomosis, bypass or graft as the cause of abnormal reaction of the patient, or of later complication, without mention of misadventure at the time of the procedure: Secondary | ICD-10-CM | POA: Insufficient documentation

## 2017-10-27 DIAGNOSIS — T82898A Other specified complication of vascular prosthetic devices, implants and grafts, initial encounter: Secondary | ICD-10-CM | POA: Insufficient documentation

## 2017-10-27 DIAGNOSIS — I252 Old myocardial infarction: Secondary | ICD-10-CM | POA: Diagnosis not present

## 2017-10-27 DIAGNOSIS — Z79899 Other long term (current) drug therapy: Secondary | ICD-10-CM | POA: Insufficient documentation

## 2017-10-27 HISTORY — PX: ABDOMINAL AORTOGRAM: CATH118222

## 2017-10-27 LAB — POCT I-STAT, CHEM 8
BUN: 32 mg/dL — AB (ref 6–20)
CALCIUM ION: 1.24 mmol/L (ref 1.15–1.40)
Chloride: 112 mmol/L — ABNORMAL HIGH (ref 101–111)
Creatinine, Ser: 1.5 mg/dL — ABNORMAL HIGH (ref 0.61–1.24)
Glucose, Bld: 96 mg/dL (ref 65–99)
HCT: 30 % — ABNORMAL LOW (ref 39.0–52.0)
Hemoglobin: 10.2 g/dL — ABNORMAL LOW (ref 13.0–17.0)
Potassium: 4.6 mmol/L (ref 3.5–5.1)
SODIUM: 143 mmol/L (ref 135–145)
TCO2: 21 mmol/L — AB (ref 22–32)

## 2017-10-27 LAB — POCT ACTIVATED CLOTTING TIME: ACTIVATED CLOTTING TIME: 158 s

## 2017-10-27 SURGERY — ABDOMINAL AORTOGRAM
Anesthesia: LOCAL

## 2017-10-27 MED ORDER — NITROGLYCERIN 1 MG/10 ML FOR IR/CATH LAB
INTRA_ARTERIAL | Status: DC | PRN
  Administered 2017-10-27: 200 ug via INTRA_ARTERIAL

## 2017-10-27 MED ORDER — FENTANYL CITRATE (PF) 100 MCG/2ML IJ SOLN
INTRAMUSCULAR | Status: DC | PRN
  Administered 2017-10-27 (×2): 25 ug via INTRAVENOUS

## 2017-10-27 MED ORDER — ACETAMINOPHEN 325 MG PO TABS: 650.0000 mg | ORAL_TABLET | ORAL | Status: DC | PRN

## 2017-10-27 MED ORDER — SODIUM CHLORIDE 0.9 % WEIGHT BASED INFUSION: 1.0000 mL/kg/h | INTRAVENOUS | Status: DC

## 2017-10-27 MED ORDER — LABETALOL HCL 5 MG/ML IV SOLN: 10.0000 mg | INTRAVENOUS | Status: DC | PRN

## 2017-10-27 MED ORDER — IODIXANOL 320 MG/ML IV SOLN
INTRAVENOUS | Status: DC | PRN
  Administered 2017-10-27: 105 mL via INTRA_ARTERIAL

## 2017-10-27 MED ORDER — LIDOCAINE HCL (PF) 1 % IJ SOLN
INTRAMUSCULAR | Status: AC
  Filled 2017-10-27: qty 30

## 2017-10-27 MED ORDER — SODIUM CHLORIDE 0.9 % IV SOLN: 250.0000 mL | INTRAVENOUS | Status: DC | PRN

## 2017-10-27 MED ORDER — HEPARIN (PORCINE) IN NACL 1000-0.9 UT/500ML-% IV SOLN
INTRAVENOUS | Status: AC
  Filled 2017-10-27: qty 500

## 2017-10-27 MED ORDER — HEPARIN SODIUM (PORCINE) 1000 UNIT/ML IJ SOLN
INTRAMUSCULAR | Status: DC | PRN
  Administered 2017-10-27: 2000 [IU] via INTRAVENOUS

## 2017-10-27 MED ORDER — SODIUM CHLORIDE 0.9% FLUSH: 3.0000 mL | Freq: Two times a day (BID) | INTRAVENOUS | Status: DC

## 2017-10-27 MED ORDER — HEPARIN SODIUM (PORCINE) 1000 UNIT/ML IJ SOLN
INTRAMUSCULAR | Status: AC
  Filled 2017-10-27: qty 1

## 2017-10-27 MED ORDER — LIDOCAINE HCL (PF) 1 % IJ SOLN
INTRAMUSCULAR | Status: DC | PRN
  Administered 2017-10-27: 2 mL

## 2017-10-27 MED ORDER — SODIUM CHLORIDE 0.9% FLUSH: 3.0000 mL | INTRAVENOUS | Status: DC | PRN

## 2017-10-27 MED ORDER — HYDRALAZINE HCL 20 MG/ML IJ SOLN: 5.0000 mg | INTRAMUSCULAR | Status: DC | PRN

## 2017-10-27 MED ORDER — SODIUM CHLORIDE 0.9 % IV SOLN
INTRAVENOUS | Status: DC
  Administered 2017-10-27: 08:00:00 via INTRAVENOUS

## 2017-10-27 MED ORDER — MORPHINE SULFATE (PF) 10 MG/ML IV SOLN: 1.0000 mg | INTRAVENOUS | Status: DC | PRN

## 2017-10-27 MED ORDER — NITROGLYCERIN 1 MG/10 ML FOR IR/CATH LAB
INTRA_ARTERIAL | Status: AC
  Filled 2017-10-27: qty 10

## 2017-10-27 MED ORDER — HEPARIN (PORCINE) IN NACL 2-0.9 UNITS/ML
INTRAMUSCULAR | Status: AC | PRN
  Administered 2017-10-27 (×2): 500 mL via INTRA_ARTERIAL

## 2017-10-27 MED ORDER — ONDANSETRON HCL 4 MG/2ML IJ SOLN: 4.0000 mg | Freq: Four times a day (QID) | INTRAMUSCULAR | Status: DC | PRN

## 2017-10-27 MED ORDER — OXYCODONE HCL 5 MG PO TABS: 5.0000 mg | ORAL_TABLET | ORAL | Status: DC | PRN

## 2017-10-27 MED ORDER — MIDAZOLAM HCL 2 MG/2ML IJ SOLN
INTRAMUSCULAR | Status: DC | PRN
  Administered 2017-10-27 (×2): 1 mg via INTRAVENOUS

## 2017-10-27 MED ORDER — FENTANYL CITRATE (PF) 100 MCG/2ML IJ SOLN
INTRAMUSCULAR | Status: AC
  Filled 2017-10-27: qty 2

## 2017-10-27 MED ORDER — MIDAZOLAM HCL 2 MG/2ML IJ SOLN
INTRAMUSCULAR | Status: AC
  Filled 2017-10-27: qty 2

## 2017-10-27 SURGICAL SUPPLY — 12 items
CATH ANGIO 5F PIGTAIL 100CM (CATHETERS) ×1 IMPLANT
COVER PRB 48X5XTLSCP FOLD TPE (BAG) IMPLANT
COVER PROBE 5X48 (BAG) ×2
KIT MICROPUNCTURE NIT STIFF (SHEATH) ×1 IMPLANT
KIT PV (KITS) ×2 IMPLANT
SHEATH AVANTI 11CM 5FR (SHEATH) ×1 IMPLANT
STOPCOCK MORSE 400PSI 3WAY (MISCELLANEOUS) ×1 IMPLANT
SYR MEDRAD MARK V 150ML (SYRINGE) ×2 IMPLANT
TRANSDUCER W/STOPCOCK (MISCELLANEOUS) ×2 IMPLANT
TRAY PV CATH (CUSTOM PROCEDURE TRAY) ×2 IMPLANT
TUBING HIGH PRESSURE 120CM (CONNECTOR) ×1 IMPLANT
WIRE BENTSON .035X145CM (WIRE) ×1 IMPLANT

## 2017-10-27 NOTE — Progress Notes (Signed)
Called to see pt prior to his discharge.  He developed a small hematoma earlier today at the left brachial artery access site.  Pressure was held and site still with hematoma but soft and no active bleeding.  When I saw pt, nurse reports hematoma has stayed the same.  The hematoma is soft and there is no active bleeding.  He has an easily palpable left radial and brachial pulse.  He denies any pain.  Instructed pt to hold pressure if bleeding and to call or present to ER if he has any other difficulties.    He states Dr. Trula Slade will take him to the OR later this week.  I instructed his wife to call our office if she hasn't heard from them by lunch time tomorrow.    Leontine Locket, [facility] 10/27/2017 2:14 PM

## 2017-10-27 NOTE — Progress Notes (Signed)
Left brachial sheath removed and pressure held for 20 minutes. Left radial pulse palpable with no edvidence of hematoma. Bedrest begins at 1115 am for 4 hours. There are no apparent complications from procedure. Arm level 0 with a small amount of bruising.

## 2017-10-27 NOTE — Progress Notes (Signed)
Upon arrival to short stay, it was noted that Mr Stephen [name] had developed a small hematoma to left brachial area. Pressure held again for 15 minutes, Patient now has small, soft,  level 2 hematoma. Bedrest starts again at 1150 am for 2 hours.

## 2017-10-27 NOTE — Op Note (Signed)
    Patient name: Stephen [name] MRN: [id number] DOB: [birth year] Sex: male  10/27/2017 Pre-operative Diagnosis:  ? Type 1b endoleak Post-operative diagnosis:  Same Surgeon:  Annamarie Major Procedure Performed:  1.  Ultrasound-guided access, left brachial artery  2.  Abdominal aortogram  3.  Conscious sedation (minutes)   Indications: Patient has previously undergone endovascular repair of an abdominal and iliac aneurysm.  His most recent CT scan showed a possible type Ib endoleak, he is here for further evaluation  Procedure:  The patient was identified in the holding area and taken to room 8.  The patient was then placed supine on the table and prepped and draped in the usual sterile fashion.  A time out was called.  Ultrasound was used to evaluate the left brachial artery.  It was patent .  A digital ultrasound image was acquired.  A micropuncture needle was used to access the left brachial artery under ultrasound guidance.  An 018 wire was advanced without resistance and a micropuncture sheath was placed.  The 018 wire was removed and a benson wire was placed.  The micropuncture sheath was exchanged for a 5 french sheath.  2000 units of heparin and 200 mcg of nitroglycerin were administered through the sheath.  Next using a pigtail catheter and a Bentson wire, the catheter was directed down into the descending thoracic aorta and ultimately into the abdominal aortic stent graft where contrast injections were performed  Findings:   Aortogram: Aortic stent graft is situated in good position below the renal arteries which remained patent.  The left external and internal iliac arteries are widely patent.  There does appear to be a type Ib endoleak.   Intervention: None  Impression:  #1  Type Ib endoleak which will be repaired in the operating room   V. Annamarie Major, M.D. [facility] [place] Office: [phone] Pager:  [phone]

## 2017-10-27 NOTE — Discharge Instructions (Signed)
Brachial Site Care Refer to this sheet in the next few weeks. These instructions provide you with information about caring for yourself after your procedure. Your health care provider may also give you more specific instructions. Your treatment has been planned according to current medical practices, but problems sometimes occur. Call your health care provider if you have any problems or questions after your procedure. What can I expect after the procedure? After your procedure, it is typical to have the following:  Bruising at the brachial site that usually fades within 1-2 weeks.  Blood collecting in the tissue (hematoma) that may be painful to the touch. It should usually decrease in size and tenderness within 1-2 weeks.  Follow these instructions at home:  Take medicines only as directed by your health care provider.  You may shower 24-48 hours after the procedure or as directed by your health care provider. Remove the bandage (dressing) and gently wash the site with plain soap and water. Pat the area dry with a clean towel. Do not rub the site, because this may cause bleeding.  Do not take baths, swim, or use a hot tub until your health care provider approves.  Check your insertion site every day for redness, swelling, or drainage.  Do not apply powder or lotion to the site.  Do not flex or bend the affected arm for 24 hours or as directed by your health care provider.  Do not push or pull heavy objects with the affected arm for 24 hours or as directed by your health care provider.  Do not lift over 10 lb (4.5 kg) for 5 days after your procedure or as directed by your health care provider.  Ask your health care provider when it is okay to: ? Return to work or school. ? Resume usual physical activities or sports. ? Resume sexual activity.  Do not drive home if you are discharged the same day as the procedure. Have someone else drive you.  You may drive 24 hours after the  procedure unless otherwise instructed by your health care provider.  Do not operate machinery or power tools for 24 hours after the procedure.  If your procedure was done as an outpatient procedure, which means that you went home the same day as your procedure, a responsible adult should be with you for the first 24 hours after you arrive home.  Keep all follow-up visits as directed by your health care provider. This is important. Contact a health care provider if:  You have a fever.  You have chills.  You have increased bleeding from the brachial site. Hold pressure on the site. Get help right away if:  You have unusual pain at the brachial site.  You have redness, warmth, or swelling at the brachial  site.  You have drainage (other than a small amount of blood on the dressing) from the brachial site.  The radial site is bleeding, and the bleeding does not stop after 30 minutes of holding steady pressure on the site.  Your arm or hand becomes pale, cool, tingly, or numb. This information is not intended to replace advice given to you by your health care provider. Make sure you discuss any questions you have with your health care provider. Document Released: 06/21/2010 Document Revised: 10/25/2015 Document Reviewed: 12/05/2013 Elsevier Interactive Patient Education  2018 [employer].

## 2017-10-27 NOTE — Telephone Encounter (Signed)
Instructed to be at [facility] admitting department on 10/30/17 at 5:30 am for surgery. NPO past MN night prior and expect a call and follow the detailed instructions received from the pre-admission department about this surgery.

## 2017-10-27 NOTE — Progress Notes (Signed)
Left brachial site soft compared to before Satira Sark held pressure to site, still swollen, had Nydia Bouton from cath lab to check left brachial area and she will notify Dr Trula Slade to come check client's site

## 2017-10-27 NOTE — Interval H&P Note (Signed)
History and Physical Interval Note:  10/27/2017 9:58 AM  Stephen [name]  has presented today for surgery, with the diagnosis of endo-leak post EVAR  The various methods of treatment have been discussed with the patient and family. After consideration of risks, benefits and other options for treatment, the patient has consented to  Procedure(s): ABDOMINAL AORTOGRAM (N/A) as a surgical intervention .  The patient's history has been reviewed, patient examined, no change in status, stable for surgery.  I have reviewed the patient's chart and labs.  Questions were answered to the patient's satisfaction.     Annamarie Major

## 2017-10-28 ENCOUNTER — Encounter (HOSPITAL_COMMUNITY): Payer: Self-pay | Admitting: Surgery

## 2017-10-28 MED FILL — Heparin Sod (Porcine)-NaCl IV Soln 1000 Unit/500ML-0.9%: INTRAVENOUS | Qty: 1000 | Status: AC

## 2017-10-29 ENCOUNTER — Other Ambulatory Visit: Payer: Self-pay

## 2017-10-29 ENCOUNTER — Encounter (HOSPITAL_COMMUNITY): Payer: Self-pay | Admitting: *Deleted

## 2017-10-29 NOTE — Progress Notes (Signed)
Pt denies any acute cardiopulmonary issues. Pt made aware to stop taking vitamins, fish oil and herbal medications. Do not take any NSAIDs ie: Ibuprofen, Advil, Naproxen (Aleve), Motrin, BC and Goody Powder. Pt verbalized understanding of all pre-op instructions.

## 2017-10-30 ENCOUNTER — Inpatient Hospital Stay (HOSPITAL_COMMUNITY): Payer: Medicare Other | Admitting: Anesthesiology

## 2017-10-30 ENCOUNTER — Ambulatory Visit (HOSPITAL_COMMUNITY)
Admission: RE | Admit: 2017-10-30 | Discharge: 2017-10-30 | Disposition: A | Discharge status: Home / Self Care | Payer: Medicare Other | Source: Ambulatory Visit | Attending: Surgery | Admitting: Surgery

## 2017-10-30 ENCOUNTER — Encounter (HOSPITAL_COMMUNITY): Payer: Self-pay | Admitting: *Deleted

## 2017-10-30 ENCOUNTER — Encounter (HOSPITAL_COMMUNITY)
Admission: RE | Disposition: A | Discharge status: Home / Self Care | Payer: Self-pay | Source: Ambulatory Visit | Attending: Surgery

## 2017-10-30 DIAGNOSIS — B182 Chronic viral hepatitis C: Secondary | ICD-10-CM | POA: Diagnosis not present

## 2017-10-30 DIAGNOSIS — T82330A Leakage of aortic (bifurcation) graft (replacement), initial encounter: Secondary | ICD-10-CM | POA: Insufficient documentation

## 2017-10-30 DIAGNOSIS — I723 Aneurysm of iliac artery: Secondary | ICD-10-CM | POA: Insufficient documentation

## 2017-10-30 DIAGNOSIS — E7800 Pure hypercholesterolemia, unspecified: Secondary | ICD-10-CM | POA: Insufficient documentation

## 2017-10-30 DIAGNOSIS — Z7982 Long term (current) use of aspirin: Secondary | ICD-10-CM | POA: Insufficient documentation

## 2017-10-30 DIAGNOSIS — I252 Old myocardial infarction: Secondary | ICD-10-CM | POA: Insufficient documentation

## 2017-10-30 DIAGNOSIS — I714 Abdominal aortic aneurysm, without rupture: Secondary | ICD-10-CM | POA: Diagnosis not present

## 2017-10-30 DIAGNOSIS — Y832 Surgical operation with anastomosis, bypass or graft as the cause of abnormal reaction of the patient, or of later complication, without mention of misadventure at the time of the procedure: Secondary | ICD-10-CM | POA: Diagnosis not present

## 2017-10-30 DIAGNOSIS — F17210 Nicotine dependence, cigarettes, uncomplicated: Secondary | ICD-10-CM | POA: Diagnosis not present

## 2017-10-30 DIAGNOSIS — I10 Essential (primary) hypertension: Secondary | ICD-10-CM | POA: Insufficient documentation

## 2017-10-30 DIAGNOSIS — T82898A Other specified complication of vascular prosthetic devices, implants and grafts, initial encounter: Secondary | ICD-10-CM | POA: Diagnosis not present

## 2017-10-30 DIAGNOSIS — Z8619 Personal history of other infectious and parasitic diseases: Secondary | ICD-10-CM

## 2017-10-30 DIAGNOSIS — Z955 Presence of coronary angioplasty implant and graft: Secondary | ICD-10-CM | POA: Diagnosis not present

## 2017-10-30 HISTORY — PX: ABDOMINAL AORTIC ENDOVASCULAR STENT GRAFT: SHX5707

## 2017-10-30 LAB — URINALYSIS, ROUTINE W REFLEX MICROSCOPIC
BACTERIA UA: NONE SEEN
BILIRUBIN URINE: NEGATIVE
GLUCOSE, UA: NEGATIVE mg/dL
KETONES UR: NEGATIVE mg/dL
NITRITE: NEGATIVE
PROTEIN: 30 mg/dL — AB
Specific Gravity, Urine: 1.017 (ref 1.005–1.030)
pH: 5 (ref 5.0–8.0)

## 2017-10-30 LAB — COMPREHENSIVE METABOLIC PANEL
ALT: 10 U/L — ABNORMAL LOW (ref 17–63)
AST: 15 U/L (ref 15–41)
Albumin: 3.1 g/dL — ABNORMAL LOW (ref 3.5–5.0)
Alkaline Phosphatase: 69 U/L (ref 38–126)
Anion gap: 8 (ref 5–15)
BUN: 26 mg/dL — ABNORMAL HIGH (ref 6–20)
CHLORIDE: 108 mmol/L (ref 101–111)
CO2: 20 mmol/L — ABNORMAL LOW (ref 22–32)
CREATININE: 1.54 mg/dL — AB (ref 0.61–1.24)
Calcium: 8.7 mg/dL — ABNORMAL LOW (ref 8.9–10.3)
GFR calc non Af Amer: 42 mL/min — ABNORMAL LOW (ref >60)
GFR, EST AFRICAN AMERICAN: 48 mL/min — AB (ref >60)
Glucose, Bld: 93 mg/dL (ref 65–99)
POTASSIUM: 4.2 mmol/L (ref 3.5–5.1)
Sodium: 136 mmol/L (ref 135–145)
Total Bilirubin: 0.7 mg/dL (ref 0.3–1.2)
Total Protein: 5.5 g/dL — ABNORMAL LOW (ref 6.5–8.1)

## 2017-10-30 LAB — SURGICAL PCR SCREEN
MRSA, PCR: NEGATIVE
Staphylococcus aureus: NEGATIVE

## 2017-10-30 LAB — TYPE AND SCREEN
ABO/RH(D): A POS
Antibody Screen: NEGATIVE

## 2017-10-30 LAB — CBC
HCT: 27.2 % — ABNORMAL LOW (ref 39.0–52.0)
Hemoglobin: 8.9 g/dL — ABNORMAL LOW (ref 13.0–17.0)
MCH: 33.1 pg (ref 26.0–34.0)
MCHC: 32.7 g/dL (ref 30.0–36.0)
MCV: 101.1 fL — ABNORMAL HIGH (ref 78.0–100.0)
PLATELETS: 80 10*3/uL — AB (ref 150–400)
RBC: 2.69 MIL/uL — ABNORMAL LOW (ref 4.22–5.81)
RDW: 14 % (ref 11.5–15.5)
WBC: 4.9 10*3/uL (ref 4.0–10.5)

## 2017-10-30 LAB — PROTIME-INR
INR: 1.34
PROTHROMBIN TIME: 16.5 s — AB (ref 11.4–15.2)

## 2017-10-30 LAB — APTT: aPTT: 34 seconds (ref 24–36)

## 2017-10-30 SURGERY — INSERTION, ENDOVASCULAR STENT GRAFT, AORTA, ABDOMINAL
Anesthesia: General | Site: Groin

## 2017-10-30 MED ORDER — HYDROMORPHONE HCL 2 MG/ML IJ SOLN: 0.2500 mg | INTRAMUSCULAR | Status: DC | PRN

## 2017-10-30 MED ORDER — LIDOCAINE 2% (20 MG/ML) 5 ML SYRINGE
INTRAMUSCULAR | Status: AC
  Filled 2017-10-30: qty 5

## 2017-10-30 MED ORDER — PROTAMINE SULFATE 10 MG/ML IV SOLN
INTRAVENOUS | Status: AC
  Filled 2017-10-30: qty 5

## 2017-10-30 MED ORDER — PROTAMINE SULFATE 10 MG/ML IV SOLN
INTRAVENOUS | Status: DC | PRN
  Administered 2017-10-30: 10 mg via INTRAVENOUS
  Administered 2017-10-30: 20 mg via INTRAVENOUS
  Administered 2017-10-30 (×2): 10 mg via INTRAVENOUS

## 2017-10-30 MED ORDER — MUPIROCIN 2 % EX OINT
TOPICAL_OINTMENT | CUTANEOUS | Status: AC
  Filled 2017-10-30: qty 22

## 2017-10-30 MED ORDER — MUPIROCIN 2 % EX OINT
1.0000 "application " | TOPICAL_OINTMENT | Freq: Once | CUTANEOUS | Status: AC
  Administered 2017-10-30: 1 via TOPICAL

## 2017-10-30 MED ORDER — CHLORHEXIDINE GLUCONATE 4 % EX LIQD: 60.0000 mL | Freq: Once | CUTANEOUS | Status: DC

## 2017-10-30 MED ORDER — FENTANYL CITRATE (PF) 250 MCG/5ML IJ SOLN
INTRAMUSCULAR | Status: AC
  Filled 2017-10-30: qty 5

## 2017-10-30 MED ORDER — HYDROCODONE-ACETAMINOPHEN 5-325 MG PO TABS
1.0000 | ORAL_TABLET | ORAL | Status: DC | PRN
Start: 2017-10-30 — End: 2017-10-30

## 2017-10-30 MED ORDER — EZETIMIBE 10 MG PO TABS: 10.0000 mg | ORAL_TABLET | Freq: Every evening | ORAL | Status: DC

## 2017-10-30 MED ORDER — EPHEDRINE SULFATE 50 MG/ML IJ SOLN
INTRAMUSCULAR | Status: AC
  Filled 2017-10-30: qty 1

## 2017-10-30 MED ORDER — SODIUM CHLORIDE 0.9 % IV SOLN
INTRAVENOUS | Status: AC
  Filled 2017-10-30: qty 1.2

## 2017-10-30 MED ORDER — PROPOFOL 10 MG/ML IV BOLUS
INTRAVENOUS | Status: DC | PRN
  Administered 2017-10-30: 100 mg via INTRAVENOUS

## 2017-10-30 MED ORDER — LACTATED RINGERS IV SOLN
INTRAVENOUS | Status: DC | PRN
  Administered 2017-10-30 (×2): via INTRAVENOUS

## 2017-10-30 MED ORDER — 0.9 % SODIUM CHLORIDE (POUR BTL) OPTIME
TOPICAL | Status: DC | PRN
  Administered 2017-10-30: 1000 mL

## 2017-10-30 MED ORDER — SPIRONOLACTONE 12.5 MG HALF TABLET: 12.5000 mg | ORAL_TABLET | Freq: Every day | ORAL | Status: DC

## 2017-10-30 MED ORDER — EPHEDRINE SULFATE 50 MG/ML IJ SOLN
INTRAMUSCULAR | Status: DC | PRN
  Administered 2017-10-30: 5 mg via INTRAVENOUS

## 2017-10-30 MED ORDER — ONDANSETRON HCL 4 MG/2ML IJ SOLN
INTRAMUSCULAR | Status: DC | PRN
  Administered 2017-10-30: 4 mg via INTRAVENOUS

## 2017-10-30 MED ORDER — HEPARIN SODIUM (PORCINE) 1000 UNIT/ML IJ SOLN
INTRAMUSCULAR | Status: DC | PRN
  Administered 2017-10-30: 6000 [IU] via INTRAVENOUS

## 2017-10-30 MED ORDER — SUGAMMADEX SODIUM 200 MG/2ML IV SOLN
INTRAVENOUS | Status: AC
  Filled 2017-10-30: qty 2

## 2017-10-30 MED ORDER — DEXAMETHASONE SODIUM PHOSPHATE 10 MG/ML IJ SOLN
INTRAMUSCULAR | Status: AC
  Filled 2017-10-30: qty 1

## 2017-10-30 MED ORDER — NITROGLYCERIN 0.4 MG SL SUBL
0.4000 mg | SUBLINGUAL_TABLET | SUBLINGUAL | Status: DC | PRN
Start: 2017-10-30 — End: 2017-10-30

## 2017-10-30 MED ORDER — MEPERIDINE HCL 50 MG/ML IJ SOLN: 6.2500 mg | INTRAMUSCULAR | Status: DC | PRN

## 2017-10-30 MED ORDER — LIDOCAINE HCL (CARDIAC) PF 100 MG/5ML IV SOSY
PREFILLED_SYRINGE | INTRAVENOUS | Status: DC | PRN
  Administered 2017-10-30: 60 mg via INTRATRACHEAL

## 2017-10-30 MED ORDER — ROCURONIUM BROMIDE 10 MG/ML (PF) SYRINGE
PREFILLED_SYRINGE | INTRAVENOUS | Status: AC
  Filled 2017-10-30: qty 5

## 2017-10-30 MED ORDER — FENTANYL CITRATE (PF) 250 MCG/5ML IJ SOLN
INTRAMUSCULAR | Status: DC | PRN
  Administered 2017-10-30: 150 ug via INTRAVENOUS

## 2017-10-30 MED ORDER — CEFAZOLIN SODIUM-DEXTROSE 2-4 GM/100ML-% IV SOLN
INTRAVENOUS | Status: AC
  Filled 2017-10-30: qty 100

## 2017-10-30 MED ORDER — SUGAMMADEX SODIUM 200 MG/2ML IV SOLN
INTRAVENOUS | Status: DC | PRN
  Administered 2017-10-30: 200 mg via INTRAVENOUS

## 2017-10-30 MED ORDER — HEPARIN SODIUM (PORCINE) 1000 UNIT/ML IJ SOLN
INTRAMUSCULAR | Status: AC
  Filled 2017-10-30: qty 1

## 2017-10-30 MED ORDER — ADULT MULTIVITAMIN W/MINERALS CH: 1.0000 | ORAL_TABLET | Freq: Every day | ORAL | Status: DC

## 2017-10-30 MED ORDER — SODIUM CHLORIDE 0.9 % IV SOLN: Freq: Once | INTRAVENOUS | Status: DC

## 2017-10-30 MED ORDER — MIDAZOLAM HCL 2 MG/2ML IJ SOLN
INTRAMUSCULAR | Status: AC
  Filled 2017-10-30: qty 2

## 2017-10-30 MED ORDER — MIDAZOLAM HCL 5 MG/5ML IJ SOLN
INTRAMUSCULAR | Status: DC | PRN
  Administered 2017-10-30: 2 mg via INTRAVENOUS

## 2017-10-30 MED ORDER — CARVEDILOL 12.5 MG PO TABS: 12.5000 mg | ORAL_TABLET | Freq: Two times a day (BID) | ORAL | Status: DC

## 2017-10-30 MED ORDER — ONDANSETRON HCL 4 MG/2ML IJ SOLN
4.0000 mg | Freq: Once | INTRAMUSCULAR | Status: DC | PRN
Start: 2017-10-30 — End: 2017-10-30

## 2017-10-30 MED ORDER — GLYCOPYRROLATE 0.2 MG/ML IJ SOLN
INTRAMUSCULAR | Status: DC | PRN
  Administered 2017-10-30: 0.2 mg via INTRAVENOUS

## 2017-10-30 MED ORDER — PHENYLEPHRINE HCL 10 MG/ML IJ SOLN
INTRAVENOUS | Status: DC | PRN
  Administered 2017-10-30: 25 ug/min via INTRAVENOUS

## 2017-10-30 MED ORDER — SODIUM CHLORIDE 0.9 % IV SOLN
INTRAVENOUS | Status: DC | PRN
  Administered 2017-10-30: 500 mL

## 2017-10-30 MED ORDER — PHENYLEPHRINE 40 MCG/ML (10ML) SYRINGE FOR IV PUSH (FOR BLOOD PRESSURE SUPPORT)
PREFILLED_SYRINGE | INTRAVENOUS | Status: AC
  Filled 2017-10-30: qty 10

## 2017-10-30 MED ORDER — ASPIRIN EC 81 MG PO TBEC: 81.0000 mg | DELAYED_RELEASE_TABLET | Freq: Every day | ORAL | Status: DC

## 2017-10-30 MED ORDER — RAMIPRIL 10 MG PO CAPS: 10.0000 mg | ORAL_CAPSULE | Freq: Every day | ORAL | Status: DC

## 2017-10-30 MED ORDER — SODIUM CHLORIDE 0.9 % IV SOLN: INTRAVENOUS | Status: DC

## 2017-10-30 MED ORDER — IODIXANOL 320 MG/ML IV SOLN
INTRAVENOUS | Status: DC | PRN
  Administered 2017-10-30: 56 mL via INTRAVENOUS

## 2017-10-30 MED ORDER — CEFAZOLIN SODIUM-DEXTROSE 2-4 GM/100ML-% IV SOLN
2.0000 g | INTRAVENOUS | Status: AC
  Administered 2017-10-30: 2 g via INTRAVENOUS

## 2017-10-30 MED ORDER — ATORVASTATIN CALCIUM 40 MG PO TABS: 40.0000 mg | ORAL_TABLET | Freq: Every evening | ORAL | Status: DC

## 2017-10-30 MED ORDER — ONDANSETRON HCL 4 MG/2ML IJ SOLN
INTRAMUSCULAR | Status: AC
  Filled 2017-10-30: qty 2

## 2017-10-30 MED ORDER — TAMSULOSIN HCL 0.4 MG PO CAPS
0.4000 mg | ORAL_CAPSULE | Freq: Every day | ORAL | Status: DC
  Administered 2017-10-30: 0.4 mg via ORAL
  Filled 2017-10-30: qty 1

## 2017-10-30 MED ORDER — ROCURONIUM BROMIDE 100 MG/10ML IV SOLN
INTRAVENOUS | Status: DC | PRN
  Administered 2017-10-30: 50 mg via INTRAVENOUS

## 2017-10-30 SURGICAL SUPPLY — 63 items
ADH SKN CLS APL DERMABOND .7 (GAUZE/BANDAGES/DRESSINGS) ×2
BAG SNAP BAND KOVER 36X36 (MISCELLANEOUS) ×2 IMPLANT
BLADE CLIPPER SURG (BLADE) ×4 IMPLANT
CANISTER SUCT 3000ML PPV (MISCELLANEOUS) ×4 IMPLANT
CATH ANGIO 5F BER 65CM (CATHETERS) ×2 IMPLANT
CATH ANGIO 5F BER2 65CM (CATHETERS) IMPLANT
CATH BEACON 5.038 65CM KMP-01 (CATHETERS) IMPLANT
CATH OMNI FLUSH .035X70CM (CATHETERS) ×2 IMPLANT
COVER PROBE W GEL 5X96 (DRAPES) ×4 IMPLANT
DERMABOND ADVANCED (GAUZE/BANDAGES/DRESSINGS) ×2
DERMABOND ADVANCED .7 DNX12 (GAUZE/BANDAGES/DRESSINGS) ×2 IMPLANT
DEVICE CLOSURE PERCLS PRGLD 6F (VASCULAR PRODUCTS) IMPLANT
DRAPE ZERO GRAVITY STERILE (DRAPES) ×4 IMPLANT
DRSG TEGADERM 2-3/8X2-3/4 SM (GAUZE/BANDAGES/DRESSINGS) ×6 IMPLANT
DRYSEAL FLEXSHEATH 16FR 33CM (SHEATH) ×2
ELECT CAUTERY BLADE 6.4 (BLADE) ×4 IMPLANT
ELECT REM PT RETURN 9FT ADLT (ELECTROSURGICAL) ×8
ELECTRODE REM PT RTRN 9FT ADLT (ELECTROSURGICAL) ×4 IMPLANT
GAUZE SPONGE 2X2 8PLY STRL LF (GAUZE/BANDAGES/DRESSINGS) ×4 IMPLANT
GLOVE BIOGEL PI IND STRL 6.5 (GLOVE) IMPLANT
GLOVE BIOGEL PI IND STRL 7.5 (GLOVE) ×2 IMPLANT
GLOVE BIOGEL PI INDICATOR 6.5 (GLOVE) ×8
GLOVE BIOGEL PI INDICATOR 7.5 (GLOVE) ×2
GLOVE SURG SS PI 6.5 STRL IVOR (GLOVE) ×2 IMPLANT
GLOVE SURG SS PI 7.5 STRL IVOR (GLOVE) ×4 IMPLANT
GOWN STRL REUS W/ TWL LRG LVL3 (GOWN DISPOSABLE) ×4 IMPLANT
GOWN STRL REUS W/ TWL XL LVL3 (GOWN DISPOSABLE) ×2 IMPLANT
GOWN STRL REUS W/TWL LRG LVL3 (GOWN DISPOSABLE) ×8
GOWN STRL REUS W/TWL XL LVL3 (GOWN DISPOSABLE) ×4
GRAFT BALLN CATH 65CM (STENTS) IMPLANT
HEMOSTAT SNOW SURGICEL 2X4 (HEMOSTASIS) IMPLANT
KIT BASIN OR (CUSTOM PROCEDURE TRAY) ×4 IMPLANT
KIT TURNOVER KIT B (KITS) ×4 IMPLANT
LEG CONTRALATERAL 27X10 (Vascular Products) IMPLANT
LEG CONTRALATERAL 27X12 (Vascular Products) ×4 IMPLANT
NDL PERC 18GX7CM (NEEDLE) ×2 IMPLANT
NEEDLE PERC 18GX7CM (NEEDLE) ×4 IMPLANT
NS IRRIG 1000ML POUR BTL (IV SOLUTION) ×4 IMPLANT
PACK ENDOVASCULAR (PACKS) ×4 IMPLANT
PAD ARMBOARD 7.5X6 YLW CONV (MISCELLANEOUS) ×8 IMPLANT
PENCIL BUTTON HOLSTER BLD 10FT (ELECTRODE) ×4 IMPLANT
PERCLOSE PROGLIDE 6F (VASCULAR PRODUCTS) ×8
SHEATH AVANTI 11CM 8FR (SHEATH) ×2 IMPLANT
SHEATH BRITE TIP 8FR 23CM (SHEATH) IMPLANT
SHEATH DRYSEAL FLEX 16FR 33CM (SHEATH) IMPLANT
SHIELD RADPAD SCOOP 12X17 (MISCELLANEOUS) ×10 IMPLANT
SPONGE GAUZE 2X2 STER 10/PKG (GAUZE/BANDAGES/DRESSINGS) ×2
STENT GRAFT BALLN CATH 65CM (STENTS) ×2
STOPCOCK MORSE 400PSI 3WAY (MISCELLANEOUS) ×4 IMPLANT
SUT ETHILON 3 0 PS 1 (SUTURE) IMPLANT
SUT PROLENE 5 0 C 1 24 (SUTURE) IMPLANT
SUT VIC AB 2-0 CT1 27 (SUTURE)
SUT VIC AB 2-0 CT1 TAPERPNT 27 (SUTURE) IMPLANT
SUT VIC AB 3-0 SH 27 (SUTURE)
SUT VIC AB 3-0 SH 27X BRD (SUTURE) IMPLANT
SUT VICRYL 4-0 PS2 18IN ABS (SUTURE) IMPLANT
SYR 30ML LL (SYRINGE) ×4 IMPLANT
TOWEL GREEN STERILE (TOWEL DISPOSABLE) ×4 IMPLANT
TRAY FOLEY MTR SLVR 16FR STAT (SET/KITS/TRAYS/PACK) ×4 IMPLANT
TUBING HIGH PRESSURE 120CM (CONNECTOR) ×6 IMPLANT
WIRE AMPLATZ SS-J .035X180CM (WIRE) IMPLANT
WIRE AMPLATZ SS-J .035X260CM (WIRE) ×2 IMPLANT
WIRE BENTSON .035X145CM (WIRE) ×2 IMPLANT

## 2017-10-30 NOTE — Anesthesia Procedure Notes (Signed)
Procedure Name: Intubation Date/Time: 10/30/2017 7:45 AM Performed by: Mariea Clonts, CRNA Pre-anesthesia Checklist: Patient identified, Emergency Drugs available, Suction available and Patient being monitored Patient Re-evaluated:Patient Re-evaluated prior to induction Oxygen Delivery Method: Circle System Utilized Preoxygenation: Pre-oxygenation with 100% oxygen Induction Type: IV induction Ventilation: Mask ventilation without difficulty Laryngoscope Size: Miller and 2 Grade View: Grade I Tube type: Oral Tube size: 7.5 mm Number of attempts: 1 Airway Equipment and Method: Stylet and Oral airway Placement Confirmation: ETT inserted through vocal cords under direct vision,  positive ETCO2 and breath sounds checked- equal and bilateral Tube secured with: Tape Dental Injury: Teeth and Oropharynx as per pre-operative assessment

## 2017-10-30 NOTE — Anesthesia Postprocedure Evaluation (Signed)
Anesthesia Post Note  Patient: DELANDO SATTER  Procedure(s) Performed: REVISION ABDOMINAL AORTIC ENDOVASCULAR STENT GRAFT DISTAL EXTENSION X2 FOR AORTA REPAIR. (N/A Groin) ABDOMINAL AORTAGRAM ACCESS  GROIN (Groin)     Patient location during evaluation: PACU Anesthesia Type: General Level of consciousness: awake and alert Pain management: pain level controlled Vital Signs Assessment: post-procedure vital signs reviewed and stable Respiratory status: spontaneous breathing, nonlabored ventilation, respiratory function stable and patient connected to nasal cannula oxygen Cardiovascular status: blood pressure returned to baseline and stable Postop Assessment: no apparent nausea or vomiting Anesthetic complications: no    Last Vitals:  Vitals:   10/30/17 1217 10/30/17 1232  BP: (!) 129/58 (!) 153/56  Pulse: (!) 53 (!) 53  Resp:    Temp:    SpO2: 99% 98%    Last Pain:  Vitals:   10/30/17 1209  TempSrc: Oral  PainSc: 0-No pain                 Braylon Grenda DAVID

## 2017-10-30 NOTE — Transfer of Care (Signed)
Immediate Anesthesia Transfer of Care Note  Patient: Stephen [name]  Procedure(s) Performed: REVISION ABDOMINAL AORTIC ENDOVASCULAR STENT GRAFT DISTAL EXTENSION X2 FOR AORTA REPAIR. (N/A Groin) ABDOMINAL AORTAGRAM ACCESS  GROIN (Groin)  Patient Location: PACU  Anesthesia Type:General  Level of Consciousness: awake, alert  and oriented  Airway & Oxygen Therapy: Patient Spontanous Breathing and Patient connected to nasal cannula oxygen  Post-op Assessment: Report given to RN and Post -op Vital signs reviewed and stable  Post vital signs: Reviewed and stable  Last Vitals:  Vitals Value Taken Time  BP 133/55 10/30/2017  9:52 AM  Temp    Pulse    Resp 13 10/30/2017 10:03 AM  SpO2    Vitals shown include unvalidated device data.  Last Pain:  Vitals:   10/30/17 0649  TempSrc:   PainSc: 0-No pain      Patients Stated Pain Goal: 8 (37/94/32 7614)  Complications: No apparent anesthesia complications

## 2017-10-30 NOTE — Anesthesia Preprocedure Evaluation (Signed)
Anesthesia Evaluation  Patient identified by MRN, date of birth, ID band Patient awake    Reviewed: Allergy & Precautions, NPO status , Patient's Chart, lab work & pertinent test results  Airway Mallampati: I  TM Distance: >3 FB Neck ROM: Full    Dental   Pulmonary Current Smoker,    Pulmonary exam normal        Cardiovascular hypertension, Pt. on medications + Past MI and + Cardiac Stents  Normal cardiovascular exam     Neuro/Psych    GI/Hepatic (+) Hepatitis -, C  Endo/Other    Renal/GU      Musculoskeletal   Abdominal   Peds  Hematology   Anesthesia Other Findings   Reproductive/Obstetrics                             Anesthesia Physical Anesthesia Plan  ASA: III  Anesthesia Plan: General   Post-op Pain Management:    Induction: Intravenous  PONV Risk Score and Plan: 1 and Ondansetron and Treatment may vary due to age or medical condition  Airway Management Planned: Oral ETT  Additional Equipment: Arterial line  Intra-op Plan:   Post-operative Plan: Extubation in OR  Informed Consent: I have reviewed the patients History and Physical, chart, labs and discussed the procedure including the risks, benefits and alternatives for the proposed anesthesia with the patient or authorized representative who has indicated his/her understanding and acceptance.     Plan Discussed with: CRNA and Surgeon  Anesthesia Plan Comments:         Anesthesia Quick Evaluation

## 2017-10-30 NOTE — Progress Notes (Addendum)
Pt unable to void, several attempts, pt was able to get up bedside 1330 and was unable to void. Pt denies being uncomfortable. Left groin level 0, right wrist level 1 with mild bruising and slightly puffy, old dressing was removed and replaced with gauze and op site firmly applied, pressure strip was place proximal to site, pt denies pain at right wrist and left groin. Maureen PA for Dr Trula Slade was contacted and requested a bladder scan. Will report back with outcome. 697 ml @ 1430, maureen pa gave order for in and out cath. 1440 sterile technique per protocol / 2 person team used, 650 ml pale yellow urine, pt had a drop of blood at urethral opening prior to cleaning him. No further complication, pt feeling better.   MAureen pa/ Dr Oneida Alar, wrote orders for admit but may go home if urinates prior to 2000, pt had urinated slight amount prior that was mostly blood and Gwenette Greet PA witnessed. Pt may go after voiding. Pt  Denies pain.

## 2017-10-30 NOTE — Interval H&P Note (Signed)
History and Physical Interval Note:  10/30/2017 7:30 AM  Stephen [name]  has presented today for surgery, with the diagnosis of ENDOLEAK STATUS POST EVAR  The various methods of treatment have been discussed with the patient and family. After consideration of risks, benefits and other options for treatment, the patient has consented to  Procedure(s): REVISION ABDOMINAL AORTIC ENDOVASCULAR STENT GRAFT (N/A) as a surgical intervention .  The patient's history has been reviewed, patient examined, no change in status, stable for surgery.  I have reviewed the patient's chart and labs.  Questions were answered to the patient's satisfaction.     Annamarie Major

## 2017-10-30 NOTE — Discharge Instructions (Signed)

## 2017-10-30 NOTE — Progress Notes (Signed)
Pt stated in PACU he 'really need to pee'.  Attempted with a urinal while on bedrest unsuccessfully. Bladder scan show 472-502 mL in bladder.  RN spoke with [place], [place].  Instructed to not perform an I&O at this time, to wait while patient is on 4-hr bedrest and continue trying to use a urinal. Matt, PA stated he would come by and see patient before Discharge home.

## 2017-10-30 NOTE — Anesthesia Procedure Notes (Signed)
Arterial Line Insertion Start/End5/31/2019 7:45 AM, 10/30/2017 7:46 AM Performed by: CRNA  Patient location: OR. Preanesthetic checklist: patient identified, IV checked, site marked, risks and benefits discussed, surgical consent, monitors and equipment checked, pre-op evaluation, timeout performed and anesthesia consent Lidocaine 1% used for infiltration Right, radial was placed Catheter size: 20 G  Attempts: 3 Procedure performed without using ultrasound guided technique. Following insertion, dressing applied and Biopatch. Post procedure assessment: normal  Patient tolerated the procedure well with no immediate complications.

## 2017-11-02 ENCOUNTER — Encounter (HOSPITAL_COMMUNITY): Payer: Self-pay | Admitting: Surgery

## 2017-11-02 NOTE — Op Note (Signed)
Patient name: Stephen [name] MRN: [id number] DOB: [birth year] Sex: male  10/30/2017 Pre-operative Diagnosis: Type 1b endoleak Post-operative diagnosis:  Same Surgeon:  Annamarie Major Assistants:  Arlee Muslim Procedure:   #1: Ultrasound-guided access, left femoral artery   #2: Abdominal aortogram   #3: Distal extension x2 Anesthesia: General Blood Loss: 50 cc Specimens: None  Findings: Significant recoil of the initial distal extension which necessitated placing a second piece  Devices used: Gore iliac limb 27 x 12 x 2  Indications: The patient has previously undergone endovascular repair of an abdominal aortic aneurysm.  The patient has a occluded right iliac system secondary to trauma many years ago and therefore a thoracic device and iliac limb were placed as an aorto uni-iliac device.  On his follow-up CT scan, he had what was thought to be a type Ib versus a type II endoleak.  He underwent angiography which confirmed a type Ib endoleak.  He comes in today for repair.  Procedure:  The patient was identified in the holding area and taken to [facility] [facility]  The patient was then placed supine on the table. general anesthesia was administered.  The patient was prepped and draped in the usual sterile fashion.  A time out was called and antibiotics were administered.  Ultrasound was used to evaluate the left common femoral artery which was patent with mild calcification.  A digital ultrasound image was acquired and stored.  A #11 blade was used to make a skin nick.  The left common femoral artery was then cannulated under ultrasound guidance with an 18-gauge needle.  An 035 wire was advanced into the aorta under fluoroscopic visualization.  The subcutaneous tissue was dilated with an 8 French sheath dilator.  Probe glide devices were deployed at the 11:00 and 1 o'clock position for pre-closure.  An 8 French sheath was placed.  The patient was then fully heparinized.  An Omni Flush catheter was  then advanced into the main body of the graft and an abdominal aortogram was performed which showed preserved patency of bilateral renal arteries.  The endograft was widely patent.  There was a type Ib endoleak.  Over an Amplatz superstiff wire, a 2 French sheath was advanced into the left limb of the graft.  I selected a iliac limb extension which was a Gore 27 x 12 device.  The image detector was rotated to a right anterior oblique position and a retrograde injection was performed through the sheath locating the left hypogastric artery.  The limb of the graft was then deployed at the level of the left hypogastric artery.  While preparing the MO balloon on the back table,B the left limb of the graft which was just deployed retracted back into the aneurysmal portion of the graft with continued type Ib endoleak.  At this point, I was a little reluctant to place an additional piece however my only other option was to embolize the hypogastric artery and extend into the external iliac artery.  Because the patient has an occluded right hypogastric artery and iliac system I was a little reluctant to do this and so I felt one more attempt at an additional piece would be appropriate.  I prepared a second Gore 27 x 12 device and inserted this.  I located the hypogastric artery on the left to a retrograde sheath injection and then I deployed the limb of the graft at the level of the hypogastric artery.  I then inserted the MOB balloon and  first performed dilatation of the distal part of the graft so that I could get some fixation at the level of the left hypogastric artery.  I then advanced the balloon into the proximal portion of the device and molded the overlap.  I did not balloon the midportion as I did not want the graft to retract.  I then performed a completion arteriogram which showed resolution of the type II endoleak and that the left limb remained in good position at the level of the left hypogastric artery.  I  was satisfied with these results.  I switched the Amplatz superstiff wire out for a Bentson wire and remove the sheath and secured the 2 Pro-glide devices, successfully closing the cannulation site.  50 mg of protamine was then administered.  Manual pressure was held on the groin for 5 minutes.  I then used cautery and the subcutaneous tissue.  The wound was hemostatic and he had excellent Doppler signals in the foot.  Dermabond was then applied.  The patient was successfully extubated and taken to the recovery room in stable condition.   Disposition: To PACU stable   V. Annamarie Major, M.D. [facility] Office: [phone] Pager:  [phone]

## 2017-11-03 ENCOUNTER — Telehealth: Payer: Self-pay | Admitting: Surgery

## 2017-11-03 ENCOUNTER — Encounter (HOSPITAL_COMMUNITY): Payer: Self-pay | Admitting: Surgery

## 2017-11-03 ENCOUNTER — Other Ambulatory Visit: Payer: Self-pay

## 2017-11-03 DIAGNOSIS — Z48812 Encounter for surgical aftercare following surgery on the circulatory system: Secondary | ICD-10-CM

## 2017-11-03 DIAGNOSIS — I714 Abdominal aortic aneurysm, without rupture, unspecified: Secondary | ICD-10-CM

## 2017-11-03 NOTE — Telephone Encounter (Signed)
sch appt spk to pt 12/07/17 1010am CTA 1130am p/o MD

## 2017-11-30 ENCOUNTER — Encounter: Payer: Medicare Other | Admitting: Surgery

## 2017-12-07 ENCOUNTER — Other Ambulatory Visit: Payer: Self-pay | Admitting: Surgery

## 2017-12-07 ENCOUNTER — Ambulatory Visit
Admission: RE | Admit: 2017-12-07 | Discharge: 2017-12-07 | Disposition: A | Discharge status: Home / Self Care | Payer: Medicare Other | Source: Ambulatory Visit | Attending: Surgery | Admitting: Surgery

## 2017-12-07 ENCOUNTER — Encounter: Payer: Medicare Other | Admitting: Surgery

## 2017-12-07 ENCOUNTER — Ambulatory Visit (INDEPENDENT_AMBULATORY_CARE_PROVIDER_SITE_OTHER): Payer: Medicare Other | Admitting: Surgery

## 2017-12-07 ENCOUNTER — Other Ambulatory Visit: Payer: Medicare Other

## 2017-12-07 ENCOUNTER — Other Ambulatory Visit: Payer: Self-pay

## 2017-12-07 ENCOUNTER — Encounter: Payer: Self-pay | Admitting: Surgery

## 2017-12-07 VITALS — BP 141/70 | HR 58 | Resp 96 | Ht 68.0 in | Wt 130.0 lb

## 2017-12-07 DIAGNOSIS — I714 Abdominal aortic aneurysm, without rupture, unspecified: Secondary | ICD-10-CM

## 2017-12-07 DIAGNOSIS — Z48812 Encounter for surgical aftercare following surgery on the circulatory system: Secondary | ICD-10-CM

## 2017-12-07 MED ORDER — IOPAMIDOL (ISOVUE-370) INJECTION 76%
75.0000 mL | Freq: Once | INTRAVENOUS | Status: AC | PRN
  Administered 2017-12-07: 75 mL via INTRAVENOUS

## 2017-12-07 NOTE — Progress Notes (Signed)
Patient name: Stephen [name] MRN: [id number] DOB: [birth year] Sex: male  REASON FOR VISIT:     post op  HISTORY OF PRESENT ILLNESS:   Stephen [name] is a 77 y.o. male who is status post endovascular repair of a 6.1 cm abdominal aortic aneurysm on 09/11/2017.  His postop course uncomplicated.    His follow-up CT scan indicated a possible type Ib endoleak.  This was confirmed with angiography and therefore on 6 10/30/2017 he had a distal extension performed.  He is back today for follow-up.  Patient is status post MI in 2013. He had PCI intervention at that time. He has a history of hypercholesterolemia which is treated with a statin. He has a history of chronic hepatitis C. He has been treated with Harvoni. He has had complete resolution. He is medicallymanaged for hypertension.he is a smoker.  CURRENT MEDICATIONS:    Current Outpatient Medications  Medication Sig Dispense Refill  . aspirin EC 81 MG tablet Take 81 mg by mouth at bedtime.    [name] atorvastatin (LIPITOR) 40 MG tablet Take 40 mg by mouth every evening.     . carvedilol (COREG) 12.5 MG tablet TAKE 1 TABLET (12.5 MG TOTAL) BY MOUTH 2 (TWO) TIMES DAILY WITH A MEAL. 180 tablet 2  . ezetimibe (ZETIA) 10 MG tablet Take 10 mg by mouth every evening.     . Multiple Vitamin (MULITIVITAMIN WITH MINERALS) TABS Take 1 tablet by mouth daily.    . nitroGLYCERIN (NITROSTAT) 0.4 MG SL tablet Place 1 tablet (0.4 mg total) under the tongue every 5 (five) minutes as needed for chest pain. 25 tablet 3  . ramipril (ALTACE) 10 MG capsule TAKE 1 CAPSULE BY MOUTH EVERY DAY 90 capsule 1  . spironolactone (ALDACTONE) 25 MG tablet Take 0.5 tablets (12.5 mg total) by mouth daily. 45 tablet 3   Current Facility-Administered Medications  Medication Dose Route Frequency Provider Last Rate Last Dose  . 0.9 %  sodium chloride infusion  500 mL Intravenous Continuous Irene Shipper, MD        REVIEW OF SYSTEMS:    [X]  denotes positive finding, [ ]  denotes negative finding Cardiac  Comments:  Chest pain or chest pressure:    Shortness of breath upon exertion:    Short of breath when lying flat:    Irregular heart rhythm:    Constitutional    Fever or chills:      PHYSICAL EXAM:   Vitals:   12/07/17 1318 12/07/17 1320  BP: (!) 145/72 (!) 141/70  Pulse: (!) 58   Resp: (!) 96   SpO2: 96%   Weight: 130 lb (59 kg)   Height: 5\' 8"  (1.727 m)     GENERAL: The patient is a well-nourished male, in no acute distress. The vital signs are documented above. CARDIOVASCULAR: There is a regular rate and rhythm. PULMONARY: Non-labored respirations Left groin incision has healed  STUDIES:   I have reviewed his CT scan.  It has not formally been read by radiology.  There is no evidence of a type Ib endoleak.  He does have a type II leak from the inferior mesenteric artery.  I do not see a significant change in his aneurysm.   MEDICAL ISSUES:   Status post endovascular aneurysm repair: He remains stable.  He will follow-up in 6 months to evaluate his endoleak.  I will get a CT angiogram at that time.  Annamarie Major, MD [facility] [phone]  [phone] Pager [phone]

## 2017-12-16 DIAGNOSIS — K7469 Other cirrhosis of liver: Secondary | ICD-10-CM | POA: Diagnosis not present

## 2017-12-21 ENCOUNTER — Other Ambulatory Visit: Payer: Self-pay

## 2017-12-21 ENCOUNTER — Ambulatory Visit (HOSPITAL_COMMUNITY): Payer: Medicare Other | Attending: Internal Medicine

## 2017-12-21 DIAGNOSIS — I110 Hypertensive heart disease with heart failure: Secondary | ICD-10-CM | POA: Diagnosis not present

## 2017-12-21 DIAGNOSIS — I219 Acute myocardial infarction, unspecified: Secondary | ICD-10-CM | POA: Insufficient documentation

## 2017-12-21 DIAGNOSIS — I080 Rheumatic disorders of both mitral and aortic valves: Secondary | ICD-10-CM | POA: Insufficient documentation

## 2017-12-21 DIAGNOSIS — I255 Ischemic cardiomyopathy: Secondary | ICD-10-CM | POA: Diagnosis not present

## 2017-12-21 DIAGNOSIS — Z720 Tobacco use: Secondary | ICD-10-CM | POA: Insufficient documentation

## 2017-12-21 DIAGNOSIS — I714 Abdominal aortic aneurysm, without rupture: Secondary | ICD-10-CM | POA: Diagnosis not present

## 2017-12-21 DIAGNOSIS — I509 Heart failure, unspecified: Secondary | ICD-10-CM | POA: Diagnosis not present

## 2017-12-21 DIAGNOSIS — I2510 Atherosclerotic heart disease of native coronary artery without angina pectoris: Secondary | ICD-10-CM | POA: Diagnosis not present

## 2017-12-21 MED ORDER — PERFLUTREN LIPID MICROSPHERE
1.0000 mL | INTRAVENOUS | Status: AC | PRN
  Administered 2017-12-21: 2 mL via INTRAVENOUS

## 2017-12-23 ENCOUNTER — Ambulatory Visit (INDEPENDENT_AMBULATORY_CARE_PROVIDER_SITE_OTHER): Payer: Medicare Other | Admitting: Physician Assistant

## 2017-12-23 ENCOUNTER — Encounter: Payer: Self-pay | Admitting: Physician Assistant

## 2017-12-23 VITALS — BP 102/72 | HR 68 | Ht 68.0 in | Wt 132.6 lb

## 2017-12-23 DIAGNOSIS — I714 Abdominal aortic aneurysm, without rupture, unspecified: Secondary | ICD-10-CM

## 2017-12-23 DIAGNOSIS — I2510 Atherosclerotic heart disease of native coronary artery without angina pectoris: Secondary | ICD-10-CM

## 2017-12-23 DIAGNOSIS — I255 Ischemic cardiomyopathy: Secondary | ICD-10-CM | POA: Diagnosis not present

## 2017-12-23 DIAGNOSIS — I10 Essential (primary) hypertension: Secondary | ICD-10-CM

## 2017-12-23 NOTE — Patient Instructions (Signed)
Medication Instructions:   Your physician recommends that you continue on your current medications as directed. Please refer to the Current Medication list given to you today.   If you need a refill on your cardiac medications before your next appointment, please call your pharmacy.  Labwork: NONE ORDERED  TODAY    Testing/Procedures:  NONE ORDERED  TODAY    Follow-Up: WITH DR KELLY IN 3 TO 4 MONTHS   WITH PHARM D IN  THE MIDDLE SEPT. FOR ENTRESTO START    Any Other Special Instructions Will Be Listed Below (If Applicable).
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
## 2017-12-25 ENCOUNTER — Encounter: Payer: Self-pay | Admitting: Physician Assistant

## 2018-01-03 ENCOUNTER — Encounter: Payer: Self-pay | Admitting: Family Medicine

## 2018-01-03 DIAGNOSIS — IMO0002 Reserved for concepts with insufficient information to code with codable children: Secondary | ICD-10-CM | POA: Insufficient documentation

## 2018-01-03 DIAGNOSIS — T82330A Leakage of aortic (bifurcation) graft (replacement), initial encounter: Secondary | ICD-10-CM | POA: Insufficient documentation

## 2018-01-03 DIAGNOSIS — K5790 Diverticulosis of intestine, part unspecified, without perforation or abscess without bleeding: Secondary | ICD-10-CM | POA: Insufficient documentation

## 2018-03-17 DIAGNOSIS — Z23 Encounter for immunization: Secondary | ICD-10-CM | POA: Diagnosis not present

## 2018-04-16 ENCOUNTER — Other Ambulatory Visit: Payer: Self-pay | Admitting: Cardiovascular Disease

## 2018-04-21 ENCOUNTER — Other Ambulatory Visit: Payer: Self-pay

## 2018-04-23 ENCOUNTER — Ambulatory Visit: Payer: No Typology Code available for payment source

## 2018-04-23 ENCOUNTER — Other Ambulatory Visit: Payer: Self-pay | Admitting: Family Medicine

## 2018-04-23 ENCOUNTER — Ambulatory Visit (INDEPENDENT_AMBULATORY_CARE_PROVIDER_SITE_OTHER): Payer: Medicare Other

## 2018-04-23 ENCOUNTER — Other Ambulatory Visit: Payer: Self-pay | Admitting: Cardiovascular Disease

## 2018-04-23 VITALS — BP 122/70 | HR 60 | Temp 97.8°F | Ht 66.0 in | Wt 133.2 lb

## 2018-04-23 DIAGNOSIS — E785 Hyperlipidemia, unspecified: Secondary | ICD-10-CM

## 2018-04-23 DIAGNOSIS — N402 Nodular prostate without lower urinary tract symptoms: Secondary | ICD-10-CM

## 2018-04-23 DIAGNOSIS — Z0000 Encounter for general adult medical examination without abnormal findings: Secondary | ICD-10-CM | POA: Diagnosis not present

## 2018-04-23 DIAGNOSIS — D696 Thrombocytopenia, unspecified: Secondary | ICD-10-CM | POA: Diagnosis not present

## 2018-04-23 LAB — CBC WITH DIFFERENTIAL/PLATELET
BASOS ABS: 0 10*3/uL (ref 0.0–0.1)
Basophils Relative: 0.8 % (ref 0.0–3.0)
Eosinophils Absolute: 0.4 10*3/uL (ref 0.0–0.7)
Eosinophils Relative: 7.4 % — ABNORMAL HIGH (ref 0.0–5.0)
HCT: 32.1 % — ABNORMAL LOW (ref 39.0–52.0)
Hemoglobin: 11.1 g/dL — ABNORMAL LOW (ref 13.0–17.0)
LYMPHS ABS: 1.3 10*3/uL (ref 0.7–4.0)
LYMPHS PCT: 23 % (ref 12.0–46.0)
MCHC: 34.4 g/dL (ref 30.0–36.0)
MCV: 102.5 fl — AB (ref 78.0–100.0)
MONOS PCT: 7.9 % (ref 3.0–12.0)
Monocytes Absolute: 0.4 10*3/uL (ref 0.1–1.0)
NEUTROS PCT: 60.9 % (ref 43.0–77.0)
Neutro Abs: 3.5 10*3/uL (ref 1.4–7.7)
Platelets: 114 10*3/uL — ABNORMAL LOW (ref 150.0–400.0)
RBC: 3.13 Mil/uL — AB (ref 4.22–5.81)
RDW: 15.2 % (ref 11.5–15.5)
WBC: 5.7 10*3/uL (ref 4.0–10.5)

## 2018-04-23 LAB — COMPREHENSIVE METABOLIC PANEL
ALT: 8 U/L (ref 0–53)
AST: 14 U/L (ref 0–37)
Albumin: 3.8 g/dL (ref 3.5–5.2)
Alkaline Phosphatase: 76 U/L (ref 39–117)
BUN: 34 mg/dL — AB (ref 6–23)
CO2: 24 mEq/L (ref 19–32)
CREATININE: 1.59 mg/dL — AB (ref 0.40–1.50)
Calcium: 9 mg/dL (ref 8.4–10.5)
Chloride: 112 mEq/L (ref 96–112)
GFR: 44.97 mL/min — AB (ref >60.00)
GLUCOSE: 106 mg/dL — AB (ref 70–99)
Potassium: 4.7 mEq/L (ref 3.5–5.1)
Sodium: 142 mEq/L (ref 135–145)
TOTAL PROTEIN: 6.4 g/dL (ref 6.0–8.3)
Total Bilirubin: 0.6 mg/dL (ref 0.2–1.2)

## 2018-04-23 LAB — LIPID PANEL
CHOL/HDL RATIO: 3
CHOLESTEROL: 108 mg/dL (ref 0–200)
HDL: 33.7 mg/dL — AB (ref >39.00)
LDL CALC: 59 mg/dL (ref 0–99)
NonHDL: 74.58
TRIGLYCERIDES: 79 mg/dL (ref 0.0–149.0)
VLDL: 15.8 mg/dL (ref 0.0–40.0)

## 2018-04-23 NOTE — Progress Notes (Signed)
PCP notes:   Health maintenance:  No gaps identified.  Abnormal screenings:   Hearing - failed  Hearing Screening   125Hz  250Hz  500Hz  1000Hz  2000Hz  3000Hz  4000Hz  6000Hz  8000Hz   Right ear:   40 0 40  0    Left ear:   40 0 40  0     Fall risk - hx of multiple falls Fall Risk  04/23/2018 04/16/2017 10/24/2015  Falls in the past year? 1 No Yes  Comment multiple falls due to loss of balance - -  Number falls in past yr: 1 - 2 or more  Injury with Fall? 0 - No  Risk Factor Category  - - High Fall Risk  Risk for fall due to : Impaired balance/gait;History of fall(s);Impaired mobility - Impaired mobility;Impaired balance/gait  Risk for fall due to: Comment - - R AKA   Patient concerns:   None  Nurse concerns:  None  Next PCP appt:   05/03/2018 @ 0830
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
## 2018-04-23 NOTE — Patient Instructions (Signed)
Stephen [name] , Thank you for taking time to come for your Medicare Wellness Visit. I appreciate your ongoing commitment to your health goals. Please review the following plan we discussed and let me know if I can assist you in the future.   These are the goals we discussed: Goals    . Healthy Lifestyle     Starting 04/23/2018, I will attempt to eat at least 4-5 servings of fresh fruits and vegetables daily.        This is a list of the screening recommended for you and due dates:  Health Maintenance  Topic Date Due  . DTaP/Tdap/Td vaccine (1 - Tdap) 06/01/2049*  . Tetanus Vaccine  06/01/2049*  . Flu Shot  Completed  . Pneumonia vaccines  Completed  *Topic was postponed. The date shown is not the original due date.   Preventive Care for Adults  A healthy lifestyle and preventive care can promote health and wellness. Preventive health guidelines for adults include the following key practices.  . A routine yearly physical is a good way to check with your health care provider about your health and preventive screening. It is a chance to share any concerns and updates on your health and to receive a thorough exam.  . Visit your dentist for a routine exam and preventive care every 6 months. Brush your teeth twice a day and floss once a day. Good oral hygiene prevents tooth decay and gum disease.  . The frequency of eye exams is based on your age, health, family medical history, use  of contact lenses, and other factors. Follow your health care provider's recommendations for frequency of eye exams.  . Eat a healthy diet. Foods like vegetables, fruits, whole grains, low-fat dairy products, and lean protein foods contain the nutrients you need without too many calories. Decrease your intake of foods high in solid fats, added sugars, and salt. Eat the right amount of calories for you. Get information about a proper diet from your health care provider, if necessary.  . Regular physical exercise  is one of the most important things you can do for your health. Most adults should get at least 150 minutes of moderate-intensity exercise (any activity that increases your heart rate and causes you to sweat) each week. In addition, most adults need muscle-strengthening exercises on 2 or more days a week.  Silver Sneakers may be a benefit available to you. To determine eligibility, you may visit the website: www.silversneakers.com or contact program at 2318362537 Mon-Fri between 8AM-8PM.   . Maintain a healthy weight. The body mass index (BMI) is a screening tool to identify possible weight problems. It provides an estimate of body fat based on height and weight. Your health care provider can find your BMI and can help you achieve or maintain a healthy weight.   For adults 20 years and older: ? A BMI below 18.5 is considered underweight. ? A BMI of 18.5 to 24.9 is normal. ? A BMI of 25 to 29.9 is considered overweight. ? A BMI of 30 and above is considered obese.   . Maintain normal blood lipids and cholesterol levels by exercising and minimizing your intake of saturated fat. Eat a balanced diet with plenty of fruit and vegetables. Blood tests for lipids and cholesterol should begin at age 52 and be repeated every 5 years. If your lipid or cholesterol levels are high, you are over 50, or you are at high risk for heart disease, you may need your cholesterol  levels checked more frequently. Ongoing high lipid and cholesterol levels should be treated with medicines if diet and exercise are not working.  . If you smoke, find out from your health care provider how to quit. If you do not use tobacco, please do not start.  . If you choose to drink alcohol, please do not consume more than 2 drinks per day. One drink is considered to be 12 ounces (355 mL) of beer, 5 ounces (148 mL) of wine, or 1.5 ounces (44 mL) of liquor.  . If you are 11-22 years old, ask your health care provider if you should take  aspirin to prevent strokes.  . Use sunscreen. Apply sunscreen liberally and repeatedly throughout the day. You should seek shade when your shadow is shorter than you. Protect yourself by wearing long sleeves, pants, a wide-brimmed hat, and sunglasses year round, whenever you are outdoors.  . Once a month, do a whole body skin exam, using a mirror to look at the skin on your back. Tell your health care provider of new moles, moles that have irregular borders, moles that are larger than a pencil eraser, or moles that have changed in shape or color.

## 2018-04-26 ENCOUNTER — Ambulatory Visit (INDEPENDENT_AMBULATORY_CARE_PROVIDER_SITE_OTHER): Payer: Medicare Other | Admitting: Cardiovascular Disease

## 2018-04-26 ENCOUNTER — Encounter: Payer: Self-pay | Admitting: Cardiovascular Disease

## 2018-04-26 VITALS — BP 147/74 | HR 61 | Ht 66.0 in | Wt 133.8 lb

## 2018-04-26 DIAGNOSIS — I10 Essential (primary) hypertension: Secondary | ICD-10-CM | POA: Diagnosis not present

## 2018-04-26 DIAGNOSIS — E785 Hyperlipidemia, unspecified: Secondary | ICD-10-CM | POA: Diagnosis not present

## 2018-04-26 DIAGNOSIS — I714 Abdominal aortic aneurysm, without rupture, unspecified: Secondary | ICD-10-CM

## 2018-04-26 DIAGNOSIS — Z89611 Acquired absence of right leg above knee: Secondary | ICD-10-CM | POA: Diagnosis not present

## 2018-04-26 DIAGNOSIS — I255 Ischemic cardiomyopathy: Secondary | ICD-10-CM | POA: Diagnosis not present

## 2018-04-26 DIAGNOSIS — I252 Old myocardial infarction: Secondary | ICD-10-CM | POA: Diagnosis not present

## 2018-04-26 DIAGNOSIS — Z955 Presence of coronary angioplasty implant and graft: Secondary | ICD-10-CM | POA: Diagnosis not present

## 2018-04-26 DIAGNOSIS — B182 Chronic viral hepatitis C: Secondary | ICD-10-CM
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
## 2018-04-26 NOTE — Patient Instructions (Signed)
Medication Instructions:  Your physician recommends that you continue on your current medications as directed. Please refer to the Current Medication list given to you today.  If you need a refill on your cardiac medications before your next appointment, please call your pharmacy.   Follow-Up: At [facility], you and your health needs are our priority.  As part of our continuing mission to provide you with exceptional heart care, we have created designated Provider Care Teams.  These Care Teams include your primary Cardiologist (physician) and Advanced Practice Providers (APPs -  Physician Assistants and Nurse Practitioners) who all work together to provide you with the care you need, when you need it. You will need a follow up appointment in 6 months.  Please call our office 2 months in advance to schedule this appointment.  You may see Thomas Kelly, MD or one of the following Advanced Practice Providers on your designated Care Team: Hao Meng, PA-C . Angela Duke, PA-C   

## 2018-04-28 ENCOUNTER — Encounter: Payer: Self-pay | Admitting: Cardiovascular Disease

## 2018-04-30 ENCOUNTER — Encounter: Payer: No Typology Code available for payment source | Admitting: Family Medicine

## 2018-05-02 ENCOUNTER — Encounter: Payer: Self-pay | Admitting: Family Medicine

## 2018-05-02 NOTE — Progress Notes (Signed)
BP 120/70 (BP Location: Left Arm, Patient Position: Sitting, Cuff Size: Normal)   Pulse 65   Temp 97.8 F (36.6 C) (Oral)   Ht 5\' 6"  (1.676 m)   Wt 134 lb 8 oz (61 kg)   SpO2 96%   BMI 21.71 kg/m    CC: AMW f/u visit Subjective:    Patient ID: Stephen [name], male    DOB: 19-Jan-1941, 76 y.o.   MRN: [id number]  HPI: Stephen [name] is a 77 y.o. male presenting on 05/03/2018 for Annual Exam (Pt 2. )   Saw Katha Cabal 04/2018 for medicare wellness visit, note reviewed.   Known AAA s/p endovascular aortic stent graft 08/6960 complicated by endoleak followed regularly by [facility].   Cold intolerance.  Notes more trouble with balance.  Chronic R AKA.   Preventative: Colonoscopy 2009 per pt normal (?Amedeo Plenty) never received records.  Prostate cancer screening - age out.  Lung cancer screening - cigar smoker, never cigarettes Flu shot -yearly  Tetanus shot - defer Pneumovax 2018, prevnar 2017 shingrix - discussed Advanced directive discussion - has at home. HCPOA would be wife. Will bring me copy to update chart. Seat belt use discussed Sunscreen use discussed. No changing moles on skin.  Occasional cigar smoker  Alcohol - none Dentist - q6 mo Eye exam - doesn't regularly see  Moved to [facility] 2006 Lives with wife and son, no pets Occupation: retired, was [employer]  Activity: no regular exercise Diet: some water, fruits/vegetables daily   Relevant past medical, surgical, family and social history reviewed and updated as indicated. Interim medical history since our last visit reviewed. Allergies and medications reviewed and updated. Outpatient Medications Prior to Visit  Medication Sig Dispense Refill  . aspirin EC 81 MG tablet Take 81 mg by mouth at bedtime.    [name] atorvastatin (LIPITOR) 40 MG tablet TAKE 1 TABLET BY MOUTH EVERY DAY 90 tablet 3  . carvedilol (COREG) 12.5 MG tablet TAKE 1 TABLET (12.5 MG TOTAL) BY MOUTH 2 (TWO) TIMES DAILY WITH A MEAL. 180 tablet 1  .  ezetimibe (ZETIA) 10 MG tablet Take 10 mg by mouth every evening.     . Multiple Vitamin (MULITIVITAMIN WITH MINERALS) TABS Take 1 tablet by mouth daily.    . nitroGLYCERIN (NITROSTAT) 0.4 MG SL tablet Place 1 tablet (0.4 mg total) under the tongue every 5 (five) minutes as needed for chest pain. 25 tablet 3  . ramipril (ALTACE) 10 MG capsule TAKE 1 CAPSULE BY MOUTH EVERY DAY 90 capsule 1  . spironolactone (ALDACTONE) 25 MG tablet Take 0.5 tablets (12.5 mg total) by mouth daily. 45 tablet 3   Facility-Administered Medications Prior to Visit  Medication Dose Route Frequency Provider Last Rate Last Dose  . 0.9 %  sodium chloride infusion  500 mL Intravenous Continuous Irene Shipper, MD         Per HPI unless specifically indicated in ROS section below Review of Systems     Objective:    BP 120/70 (BP Location: Left Arm, Patient Position: Sitting, Cuff Size: Normal)   Pulse 65   Temp 97.8 F (36.6 C) (Oral)   Ht 5\' 6"  (1.676 m)   Wt 134 lb 8 oz (61 kg)   SpO2 96%   BMI 21.71 kg/m   Wt Readings from Last 3 Encounters:  05/03/18 134 lb 8 oz (61 kg)  04/26/18 133 lb 12.8 oz (60.7 kg)  04/23/18 133 lb 4 oz (60.4 kg)  Physical Exam  Constitutional: He is oriented to person, place, and time. He appears well-developed and well-nourished. No distress.  HENT:  Head: Normocephalic and atraumatic.  Right Ear: Hearing, tympanic membrane, external ear and ear canal normal.  Left Ear: Hearing, tympanic membrane, external ear and ear canal normal.  Nose: Nose normal.  Mouth/Throat: Uvula is midline, oropharynx is clear and moist and mucous membranes are normal. No oropharyngeal exudate, posterior oropharyngeal edema or posterior oropharyngeal erythema.  Eyes: Pupils are equal, round, and reactive to light. Conjunctivae and EOM are normal. No scleral icterus.  Neck: Normal range of motion. Neck supple. Carotid bruit is not present. No thyromegaly present.  Cardiovascular: Normal rate, regular  rhythm, normal heart sounds and intact distal pulses.  No murmur heard. Pulses:      Radial pulses are 2+ on the right side, and 2+ on the left side.  Pulmonary/Chest: Effort normal and breath sounds normal. No respiratory distress. He has no wheezes. He has no rales.  Abdominal: Soft. Bowel sounds are normal. He exhibits no distension and no mass. There is no tenderness. There is no rebound and no guarding.  Musculoskeletal: Normal range of motion. He exhibits no edema.  Chronic R AKA  Lymphadenopathy:    He has no cervical adenopathy.  Neurological: He is alert and oriented to person, place, and time.  CN grossly intact, station and gait intact  Skin: Skin is warm and dry. No rash noted.  Psychiatric: He has a normal mood and affect. His behavior is normal. Judgment and thought content normal.  Nursing note and vitals reviewed.  Results for orders placed or performed in visit on 04/23/18  CBC with Differential/Platelet  Result Value Ref Range   WBC 5.7 4.0 - 10.5 K/uL   RBC 3.13 (L) 4.22 - 5.81 Mil/uL   Hemoglobin 11.1 (L) 13.0 - 17.0 g/dL   HCT 32.1 (L) 39.0 - 52.0 %   MCV 102.5 (H) 78.0 - 100.0 fl   MCHC 34.4 30.0 - 36.0 g/dL   RDW 15.2 11.5 - 15.5 %   Platelets 114.0 (L) 150.0 - 400.0 K/uL   Neutrophils Relative % 60.9 43.0 - 77.0 %   Lymphocytes Relative 23.0 12.0 - 46.0 %   Monocytes Relative 7.9 3.0 - 12.0 %   Eosinophils Relative 7.4 (H) 0.0 - 5.0 %   Basophils Relative 0.8 0.0 - 3.0 %   Neutro Abs 3.5 1.4 - 7.7 K/uL   Lymphs Abs 1.3 0.7 - 4.0 K/uL   Monocytes Absolute 0.4 0.1 - 1.0 K/uL   Eosinophils Absolute 0.4 0.0 - 0.7 K/uL   Basophils Absolute 0.0 0.0 - 0.1 K/uL  Comprehensive metabolic panel  Result Value Ref Range   Sodium 142 135 - 145 mEq/L   Potassium 4.7 3.5 - 5.1 mEq/L   Chloride 112 96 - 112 mEq/L   CO2 24 19 - 32 mEq/L   Glucose, Bld 106 (H) 70 - 99 mg/dL   BUN 34 (H) 6 - 23 mg/dL   Creatinine, Ser 1.59 (H) 0.40 - 1.50 mg/dL   Total Bilirubin 0.6  0.2 - 1.2 mg/dL   Alkaline Phosphatase 76 39 - 117 U/L   AST 14 0 - 37 U/L   ALT 8 0 - 53 U/L   Total Protein 6.4 6.0 - 8.3 g/dL   Albumin 3.8 3.5 - 5.2 g/dL   Calcium 9.0 8.4 - 10.5 mg/dL   GFR 44.97 (L) >60.00 mL/min  Lipid panel  Result Value Ref Range  Cholesterol 108 0 - 200 mg/dL   Triglycerides 79.0 0.0 - 149.0 mg/dL   HDL 33.70 (L) >39.00 mg/dL   VLDL 15.8 0.0 - 40.0 mg/dL   LDL Cholesterol 59 0 - 99 mg/dL   Total CHOL/HDL Ratio 3    NonHDL 74.58       Assessment & Plan:   Problem List Items Addressed This Visit      History of   HTN (hypertension)    Chronic, stable. Continue current regimen.       AAA (abdominal aortic aneurysm) without rupture (HCC)    Appreciate [facility] care - has had EVAR 08/2017, endoleak repair 09/2017.         Other   Tobacco use    Rare cigar.       Thrombocytopenia (HCC)    Chronic, cirrhosis related.       Renal insufficiency - Primary    New in the past year - he has had several contrasted studies. Reviewed with patient, encouraged increased water intake.       Relevant Orders   Renal function panel   Macrocytosis    Update B12 next month labs.       Relevant Orders   Vitamin B12   Hx of AKA (above knee amputation) (HCC) (Chronic)   Gait instability    Ongoing. Offered PT referral for balance training - pt declines.       Dyslipidemia (Chronic)    Chronic, stable on current regimen. Continue. The ASCVD Risk score Mikey Bussing DC Jr., et al., 2013) failed to calculate for the following reasons:   The patient has a prior MI or stroke diagnosis       Cold intolerance   Relevant Orders   TSH   T4, free   Chronic hepatitis C with cirrhosis ([place])    Completed harvoni 2017. Appreciate liver clinic care of patient.  He has known thrombocytopenia.  Check PT/INR for MELD at next month labs.       Relevant Orders   Protime-INR    Other Visit Diagnoses    Low serum vitamin B12       Relevant Orders   Vitamin B12        No orders of the defined types were placed in this encounter.  Orders Placed This Encounter  Procedures  . Renal function panel    Standing Status:   Future    Standing Expiration Date:   05/04/2019  . Vitamin B12    Standing Status:   Future    Standing Expiration Date:   05/04/2019  . Protime-INR    Standing Status:   Future    Standing Expiration Date:   05/04/2019  . TSH    Standing Status:   Future    Standing Expiration Date:   05/04/2019  . T4, free    Standing Status:   Future    Standing Expiration Date:   05/04/2019    Follow up plan: Return in about 1 year (around 05/04/2019) for medicare wellness visit, follow up visit.  [name], MD

## 2018-05-03 ENCOUNTER — Encounter: Payer: Self-pay | Admitting: Family Medicine

## 2018-05-03 ENCOUNTER — Ambulatory Visit (INDEPENDENT_AMBULATORY_CARE_PROVIDER_SITE_OTHER): Payer: Medicare Other | Admitting: Family Medicine

## 2018-05-03 VITALS — BP 120/70 | HR 65 | Temp 97.8°F | Ht 66.0 in | Wt 134.5 lb

## 2018-05-03 DIAGNOSIS — D696 Thrombocytopenia, unspecified: Secondary | ICD-10-CM | POA: Diagnosis not present

## 2018-05-03 DIAGNOSIS — R2681 Unsteadiness on feet: Secondary | ICD-10-CM | POA: Diagnosis not present

## 2018-05-03 DIAGNOSIS — D7589 Other specified diseases of blood and blood-forming organs: Secondary | ICD-10-CM

## 2018-05-03 DIAGNOSIS — B182 Chronic viral hepatitis C: Secondary | ICD-10-CM

## 2018-05-03 DIAGNOSIS — E785 Hyperlipidemia, unspecified: Secondary | ICD-10-CM

## 2018-05-03 DIAGNOSIS — N183 Chronic kidney disease, stage 3 unspecified: Secondary | ICD-10-CM | POA: Insufficient documentation

## 2018-05-03 DIAGNOSIS — I255 Ischemic cardiomyopathy: Secondary | ICD-10-CM

## 2018-05-03 DIAGNOSIS — E538 Deficiency of other specified B group vitamins: Secondary | ICD-10-CM

## 2018-05-03 DIAGNOSIS — N289 Disorder of kidney and ureter, unspecified: Secondary | ICD-10-CM | POA: Insufficient documentation

## 2018-05-03 DIAGNOSIS — I10 Essential (primary) hypertension: Secondary | ICD-10-CM | POA: Diagnosis not present

## 2018-05-03 DIAGNOSIS — Z720 Tobacco use: Secondary | ICD-10-CM | POA: Diagnosis not present

## 2018-05-03 DIAGNOSIS — Z89611 Acquired absence of right leg above knee: Secondary | ICD-10-CM

## 2018-05-03 DIAGNOSIS — I714 Abdominal aortic aneurysm, without rupture, unspecified: Secondary | ICD-10-CM

## 2018-05-03 DIAGNOSIS — R6889 Other general symptoms and signs: Secondary | ICD-10-CM | POA: Diagnosis not present

## 2018-05-03 DIAGNOSIS — K7460 Unspecified cirrhosis of liver: Secondary | ICD-10-CM

## 2018-05-03 NOTE — Assessment & Plan Note (Signed)
Update B12 next month labs.

## 2018-05-03 NOTE — Assessment & Plan Note (Signed)
Rare cigar.

## 2018-05-03 NOTE — Assessment & Plan Note (Signed)
Appreciate [facility] care - has had EVAR 08/2017, endoleak repair 09/2017.

## 2018-05-03 NOTE — Assessment & Plan Note (Signed)
Ongoing. Offered PT referral for balance training - pt declines.

## 2018-05-03 NOTE — Assessment & Plan Note (Addendum)
Chronic, cirrhosis related.

## 2018-05-03 NOTE — Assessment & Plan Note (Addendum)
Chronic, stable on current regimen. Continue. The ASCVD Risk score Stephen Bussing DC Jr., et al., 2013) failed to calculate for the following reasons:   The patient has a prior MI or stroke diagnosis

## 2018-05-03 NOTE — Assessment & Plan Note (Signed)
Chronic, stable. Continue current regimen. 

## 2018-05-03 NOTE — Assessment & Plan Note (Signed)
New in the past year - he has had several contrasted studies. Reviewed with patient, encouraged increased water intake.

## 2018-05-03 NOTE — Patient Instructions (Addendum)
Sign release for records from Dr Amedeo Plenty for colonoscopy ~2009 If interested, check with pharmacy about new 2 shot shingles series (shingrix).  Kidney function was a bit impaired compared to last year - increase water intake. Return in 1 month for lab visit to recheck this. You are doing well today, return as needed or in 1 year for next wellness visit.

## 2018-05-03 NOTE — Assessment & Plan Note (Signed)
Completed harvoni 2017. Appreciate liver clinic care of patient.  He has known thrombocytopenia.  Check PT/INR for MELD at next month labs.

## 2018-05-11 ENCOUNTER — Other Ambulatory Visit: Payer: Self-pay

## 2018-05-11 DIAGNOSIS — I714 Abdominal aortic aneurysm, without rupture, unspecified: Secondary | ICD-10-CM

## 2018-05-16 ENCOUNTER — Other Ambulatory Visit: Payer: Self-pay | Admitting: Cardiovascular Disease

## 2018-05-30 NOTE — Progress Notes (Signed)
I reviewed health advisor's note, was available for consultation, and agree with documentation and plan.  

## 2018-06-08 ENCOUNTER — Other Ambulatory Visit (INDEPENDENT_AMBULATORY_CARE_PROVIDER_SITE_OTHER): Payer: Medicare Other

## 2018-06-08 DIAGNOSIS — N289 Disorder of kidney and ureter, unspecified: Secondary | ICD-10-CM | POA: Diagnosis not present

## 2018-06-08 DIAGNOSIS — D7589 Other specified diseases of blood and blood-forming organs: Secondary | ICD-10-CM | POA: Diagnosis not present

## 2018-06-08 DIAGNOSIS — R6889 Other general symptoms and signs: Secondary | ICD-10-CM | POA: Diagnosis not present

## 2018-06-08 DIAGNOSIS — E538 Deficiency of other specified B group vitamins: Secondary | ICD-10-CM | POA: Diagnosis not present

## 2018-06-08 DIAGNOSIS — B182 Chronic viral hepatitis C: Secondary | ICD-10-CM | POA: Diagnosis not present

## 2018-06-08 DIAGNOSIS — K7460 Unspecified cirrhosis of liver: Secondary | ICD-10-CM | POA: Diagnosis not present

## 2018-06-08 LAB — RENAL FUNCTION PANEL
Albumin: 3.7 g/dL (ref 3.5–5.2)
BUN: 26 mg/dL — AB (ref 6–23)
CHLORIDE: 110 meq/L (ref 96–112)
CO2: 24 mEq/L (ref 19–32)
CREATININE: 1.44 mg/dL (ref 0.40–1.50)
Calcium: 9 mg/dL (ref 8.4–10.5)
GFR: 50.4 mL/min — AB (ref >60.00)
Glucose, Bld: 99 mg/dL (ref 70–99)
Phosphorus: 2.9 mg/dL (ref 2.3–4.6)
Potassium: 4.5 mEq/L (ref 3.5–5.1)
Sodium: 140 mEq/L (ref 135–145)

## 2018-06-08 LAB — PROTIME-INR
INR: 1.2 ratio — AB (ref 0.8–1.0)
Prothrombin Time: 14 s — ABNORMAL HIGH (ref 9.6–13.1)

## 2018-06-08 LAB — TSH: TSH: 0.94 u[IU]/mL (ref 0.35–4.50)

## 2018-06-08 LAB — VITAMIN B12: Vitamin B-12: 635 pg/mL (ref 211–911)

## 2018-06-08 LAB — T4, FREE: Free T4: 0.86 ng/dL (ref 0.60–1.60)

## 2018-06-10 ENCOUNTER — Ambulatory Visit
Admission: RE | Admit: 2018-06-10 | Discharge: 2018-06-10 | Disposition: A | Discharge status: Home / Self Care | Payer: Medicare Other | Source: Ambulatory Visit | Attending: Surgery | Admitting: Surgery

## 2018-06-10 DIAGNOSIS — I714 Abdominal aortic aneurysm, without rupture, unspecified: Secondary | ICD-10-CM

## 2018-06-10 MED ORDER — IOPAMIDOL (ISOVUE-370) INJECTION 76%
80.0000 mL | Freq: Once | INTRAVENOUS | Status: AC | PRN
  Administered 2018-06-10: 60 mL via INTRAVENOUS

## 2018-06-14 ENCOUNTER — Other Ambulatory Visit: Payer: Self-pay

## 2018-06-14 ENCOUNTER — Encounter: Payer: Self-pay | Admitting: Surgery

## 2018-06-14 ENCOUNTER — Ambulatory Visit (INDEPENDENT_AMBULATORY_CARE_PROVIDER_SITE_OTHER): Payer: Medicare Other | Admitting: Surgery

## 2018-06-14 VITALS — BP 147/76 | HR 66 | Temp 97.8°F | Resp 16 | Ht 67.0 in | Wt 133.0 lb

## 2018-06-14 DIAGNOSIS — I714 Abdominal aortic aneurysm, without rupture, unspecified: Secondary | ICD-10-CM
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
## 2018-07-01 DIAGNOSIS — K7469 Other cirrhosis of liver: Secondary | ICD-10-CM | POA: Diagnosis not present

## 2018-08-23 ENCOUNTER — Other Ambulatory Visit: Payer: Self-pay

## 2018-08-23 MED ORDER — SPIRONOLACTONE 25 MG PO TABS: 12.5000 mg | ORAL_TABLET | Freq: Every day | ORAL | 0 refills | Status: DC

## 2018-10-28 ENCOUNTER — Other Ambulatory Visit: Payer: Self-pay | Admitting: Cardiovascular Disease

## 2018-11-02 ENCOUNTER — Telehealth: Payer: Self-pay | Admitting: Cardiovascular Disease

## 2018-11-02 NOTE — Telephone Encounter (Signed)
call home phone/ consent/ my chart active/ pre reg completed

## 2018-11-04 ENCOUNTER — Encounter: Payer: Self-pay | Admitting: Cardiovascular Disease

## 2018-11-04 ENCOUNTER — Telehealth (INDEPENDENT_AMBULATORY_CARE_PROVIDER_SITE_OTHER): Payer: Medicare Other | Admitting: Cardiovascular Disease

## 2018-11-04 VITALS — BP 122/65 | HR 52 | Temp 97.8°F | Ht 67.0 in | Wt 131.0 lb

## 2018-11-04 DIAGNOSIS — I714 Abdominal aortic aneurysm, without rupture, unspecified: Secondary | ICD-10-CM

## 2018-11-04 DIAGNOSIS — I255 Ischemic cardiomyopathy: Secondary | ICD-10-CM

## 2018-11-04 DIAGNOSIS — I10 Essential (primary) hypertension: Secondary | ICD-10-CM

## 2018-11-04 DIAGNOSIS — E785 Hyperlipidemia, unspecified: Secondary | ICD-10-CM

## 2018-11-04 DIAGNOSIS — B182 Chronic viral hepatitis C: Secondary | ICD-10-CM

## 2018-11-04 DIAGNOSIS — Z89611 Acquired absence of right leg above knee: Secondary | ICD-10-CM

## 2018-11-04 DIAGNOSIS — Z955 Presence of coronary angioplasty implant and graft: Secondary | ICD-10-CM | POA: Diagnosis not present

## 2018-11-04 DIAGNOSIS — I252 Old myocardial infarction: Secondary | ICD-10-CM | POA: Diagnosis not present

## 2018-11-04 MED ORDER — SPIRONOLACTONE 25 MG PO TABS: 12.5000 mg | ORAL_TABLET | Freq: Every day | ORAL | 3 refills | Status: DC

## 2018-11-04 NOTE — Patient Instructions (Addendum)
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
## 2018-11-05 ENCOUNTER — Telehealth: Payer: Self-pay | Admitting: Cardiovascular Disease

## 2018-11-05 NOTE — Telephone Encounter (Signed)
Disregard opened in error °

## 2018-11-11 IMAGING — DX DG CHEST 1V PORT
1 series · 1 of 1 positions shown · non-contrast
Comparison: Chest CT, 08/31/2017.  Chest radiograph, 09/22/2011.

CLINICAL DATA: Status post abdominal aortic aneurysm stent
placement.

EXAM:
PORTABLE CHEST 1 VIEW

[chest ap]
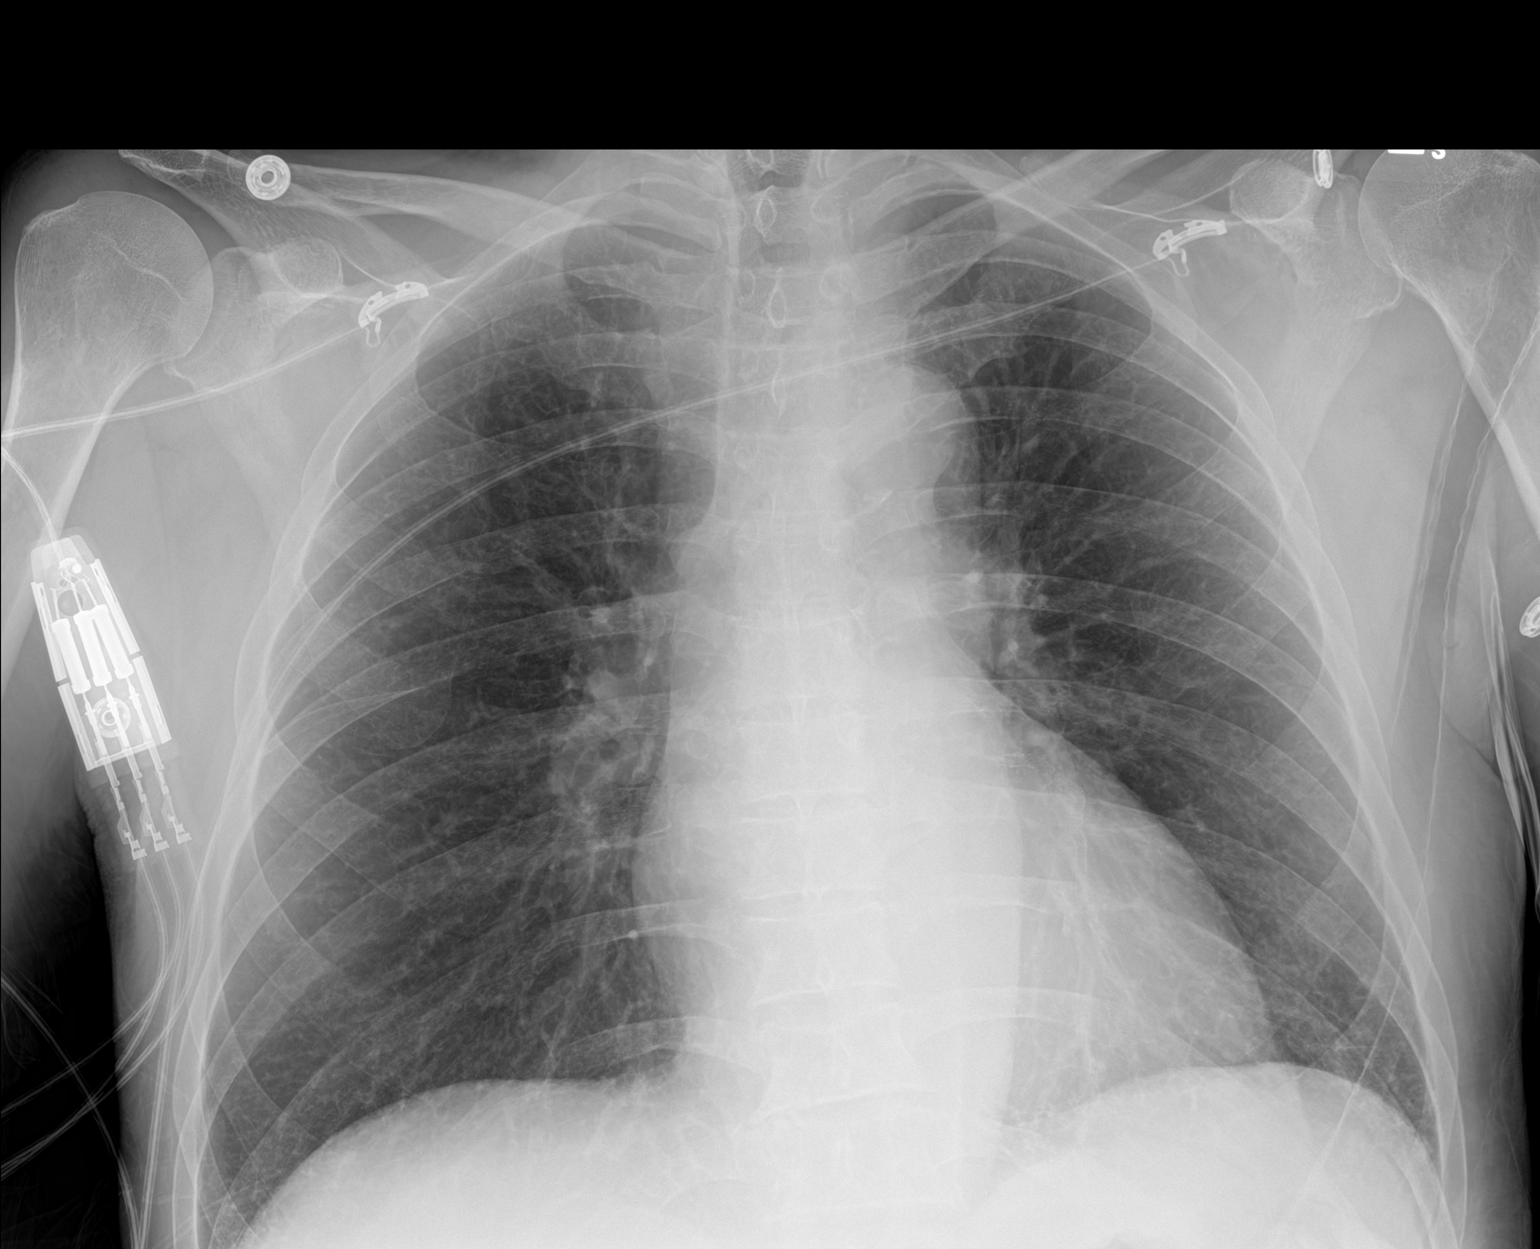

[1 of 1 positions shown; findings below may reference images not displayed]

FINDINGS: Cardiac silhouette is normal in size and configuration. There are
left coronary artery stents. No mediastinal or hilar masses. No
evidence of adenopathy.

Clear lungs.  No pleural effusion or pneumothorax.

Skeletal structures are intact.
IMPRESSION: No active disease.

## 2018-11-19 ENCOUNTER — Other Ambulatory Visit: Payer: Self-pay | Admitting: Cardiovascular Disease

## 2019-03-01 DIAGNOSIS — Z23 Encounter for immunization: Secondary | ICD-10-CM | POA: Diagnosis not present

## 2019-04-20 ENCOUNTER — Other Ambulatory Visit: Payer: Self-pay | Admitting: Cardiovascular Disease

## 2019-04-25 ENCOUNTER — Other Ambulatory Visit: Payer: Self-pay

## 2019-04-30 ENCOUNTER — Other Ambulatory Visit: Payer: Self-pay | Admitting: Family Medicine

## 2019-04-30 DIAGNOSIS — D696 Thrombocytopenia, unspecified: Secondary | ICD-10-CM

## 2019-04-30 DIAGNOSIS — E785 Hyperlipidemia, unspecified: Secondary | ICD-10-CM

## 2019-04-30 DIAGNOSIS — B182 Chronic viral hepatitis C: Secondary | ICD-10-CM

## 2019-04-30 DIAGNOSIS — N1831 Chronic kidney disease, stage 3a: Secondary | ICD-10-CM

## 2019-05-02 ENCOUNTER — Other Ambulatory Visit: Payer: Self-pay | Admitting: Cardiovascular Disease

## 2019-05-03 ENCOUNTER — Other Ambulatory Visit: Payer: Self-pay

## 2019-05-03 ENCOUNTER — Other Ambulatory Visit (INDEPENDENT_AMBULATORY_CARE_PROVIDER_SITE_OTHER): Payer: Medicare Other

## 2019-05-03 DIAGNOSIS — D696 Thrombocytopenia, unspecified: Secondary | ICD-10-CM

## 2019-05-03 DIAGNOSIS — K7460 Unspecified cirrhosis of liver: Secondary | ICD-10-CM | POA: Diagnosis not present

## 2019-05-03 DIAGNOSIS — N1831 Chronic kidney disease, stage 3a: Secondary | ICD-10-CM

## 2019-05-03 DIAGNOSIS — E785 Hyperlipidemia, unspecified: Secondary | ICD-10-CM

## 2019-05-03 DIAGNOSIS — B182 Chronic viral hepatitis C: Secondary | ICD-10-CM | POA: Diagnosis not present

## 2019-05-03 LAB — LIPID PANEL
Cholesterol: 118 mg/dL (ref 0–200)
HDL: 34 mg/dL — ABNORMAL LOW (ref >39.00)
LDL Cholesterol: 65 mg/dL (ref 0–99)
NonHDL: 84.23
Total CHOL/HDL Ratio: 3
Triglycerides: 96 mg/dL (ref 0.0–149.0)
VLDL: 19.2 mg/dL (ref 0.0–40.0)

## 2019-05-03 LAB — COMPREHENSIVE METABOLIC PANEL
ALT: 12 U/L (ref 0–53)
AST: 17 U/L (ref 0–37)
Albumin: 3.8 g/dL (ref 3.5–5.2)
Alkaline Phosphatase: 97 U/L (ref 39–117)
BUN: 31 mg/dL — ABNORMAL HIGH (ref 6–23)
CO2: 22 mEq/L (ref 19–32)
Calcium: 9.3 mg/dL (ref 8.4–10.5)
Chloride: 111 mEq/L (ref 96–112)
Creatinine, Ser: 1.55 mg/dL — ABNORMAL HIGH (ref 0.40–1.50)
GFR: 43.46 mL/min — ABNORMAL LOW (ref >60.00)
Glucose, Bld: 95 mg/dL (ref 70–99)
Potassium: 4.7 mEq/L (ref 3.5–5.1)
Sodium: 142 mEq/L (ref 135–145)
Total Bilirubin: 0.8 mg/dL (ref 0.2–1.2)
Total Protein: 6.7 g/dL (ref 6.0–8.3)

## 2019-05-03 LAB — PROTIME-INR
INR: 1.2 ratio — ABNORMAL HIGH (ref 0.8–1.0)
Prothrombin Time: 14 s — ABNORMAL HIGH (ref 9.6–13.1)

## 2019-05-03 LAB — CBC WITH DIFFERENTIAL/PLATELET
Basophils Absolute: 0 10*3/uL (ref 0.0–0.1)
Basophils Relative: 0.9 % (ref 0.0–3.0)
Eosinophils Absolute: 0.4 10*3/uL (ref 0.0–0.7)
Eosinophils Relative: 7.3 % — ABNORMAL HIGH (ref 0.0–5.0)
HCT: 33.8 % — ABNORMAL LOW (ref 39.0–52.0)
Hemoglobin: 11.2 g/dL — ABNORMAL LOW (ref 13.0–17.0)
Lymphocytes Relative: 21 % (ref 12.0–46.0)
Lymphs Abs: 1.1 10*3/uL (ref 0.7–4.0)
MCHC: 33.2 g/dL (ref 30.0–36.0)
MCV: 103.7 fl — ABNORMAL HIGH (ref 78.0–100.0)
Monocytes Absolute: 0.4 10*3/uL (ref 0.1–1.0)
Monocytes Relative: 7.4 % (ref 3.0–12.0)
Neutro Abs: 3.3 10*3/uL (ref 1.4–7.7)
Neutrophils Relative %: 63.4 % (ref 43.0–77.0)
Platelets: 135 10*3/uL — ABNORMAL LOW (ref 150.0–400.0)
RBC: 3.26 Mil/uL — ABNORMAL LOW (ref 4.22–5.81)
RDW: 14.9 % (ref 11.5–15.5)
WBC: 5.2 10*3/uL (ref 4.0–10.5)

## 2019-05-03 LAB — VITAMIN D 25 HYDROXY (VIT D DEFICIENCY, FRACTURES): VITD: 47.39 ng/mL (ref 30.00–100.00)

## 2019-05-04 ENCOUNTER — Ambulatory Visit: Payer: Medicare Other

## 2019-05-04 ENCOUNTER — Other Ambulatory Visit: Payer: Self-pay

## 2019-05-04 ENCOUNTER — Ambulatory Visit (INDEPENDENT_AMBULATORY_CARE_PROVIDER_SITE_OTHER): Payer: Medicare Other

## 2019-05-04 VITALS — BP 122/68 | Wt 130.0 lb

## 2019-05-04 DIAGNOSIS — Z0000 Encounter for general adult medical examination without abnormal findings: Secondary | ICD-10-CM

## 2019-05-04 NOTE — Patient Instructions (Signed)
Stephen [name] , Thank you for taking time to come for your Medicare Wellness Visit. I appreciate your ongoing commitment to your health goals. Please review the following plan we discussed and let me know if I can assist you in the future.   Screening recommendations/referrals: Colonoscopy: no longer required Recommended yearly ophthalmology/optometry visit for glaucoma screening and checkup Recommended yearly dental visit for hygiene and checkup  Vaccinations: Influenza vaccine: Up to date, completed 03/01/2019 Pneumococcal vaccine: Completed series Tdap vaccine: decline Shingles vaccine: decline    Advanced directives: Please bring a copy of your POA (Power of Attorney) and/or Living Will to your next appointment.   Conditions/risks identified: hypertension, hyperlipidemia  Next appointment: 05/10/2019 @ 8:30 am   Preventive Care 79 Years and Older, Male Preventive care refers to lifestyle choices and visits with your health care provider that can promote health and wellness. What does preventive care include?  A yearly physical exam. This is also called an annual well check.  Dental exams once or twice a year.  Routine eye exams. Ask your health care provider how often you should have your eyes checked.  Personal lifestyle choices, including:  Daily care of your teeth and gums.  Regular physical activity.  Eating a healthy diet.  Avoiding tobacco and drug use.  Limiting alcohol use.  Practicing safe sex.  Taking low doses of aspirin every day.  Taking vitamin and mineral supplements as recommended by your health care provider. What happens during an annual well check? The services and screenings done by your health care provider during your annual well check will depend on your age, overall health, lifestyle risk factors, and family history of disease. Counseling  Your health care provider may ask you questions about your:  Alcohol use.  Tobacco use.  Drug use.   Emotional well-being.  Home and relationship well-being.  Sexual activity.  Eating habits.  History of falls.  Memory and ability to understand (cognition).  Work and work Statistician. Screening  You may have the following tests or measurements:  Height, weight, and BMI.  Blood pressure.  Lipid and cholesterol levels. These may be checked every 5 years, or more frequently if you are over 79 years old.  Skin check.  Lung cancer screening. You may have this screening every year starting at age 79 if you have a 30-pack-year history of smoking and currently smoke or have quit within the past 15 years.  Fecal occult blood test (FOBT) of the stool. You may have this test every year starting at age 79.  Flexible sigmoidoscopy or colonoscopy. You may have a sigmoidoscopy every 5 years or a colonoscopy every 10 years starting at age 79.  Prostate cancer screening. Recommendations will vary depending on your family history and other risks.  Hepatitis C blood test.  Hepatitis B blood test.  Sexually transmitted disease (STD) testing.  Diabetes screening. This is done by checking your blood sugar (glucose) after you have not eaten for a while (fasting). You may have this done every 1-3 years.  Abdominal aortic aneurysm (AAA) screening. You may need this if you are a current or former smoker.  Osteoporosis. You may be screened starting at age 79 if you are at high risk. Talk with your health care provider about your test results, treatment options, and if necessary, the need for more tests. Vaccines  Your health care provider may recommend certain vaccines, such as:  Influenza vaccine. This is recommended every year.  Tetanus, diphtheria, and acellular pertussis (Tdap,  Td) vaccine. You may need a Td booster every 10 years.  Zoster vaccine. You may need this after age 15.  Pneumococcal 13-valent conjugate (PCV13) vaccine. One dose is recommended after age 79.  Pneumococcal  polysaccharide (PPSV23) vaccine. One dose is recommended after age 79. Talk to your health care provider about which screenings and vaccines you need and how often you need them. This information is not intended to replace advice given to you by your health care provider. Make sure you discuss any questions you have with your health care provider. Document Released: 06/15/2015 Document Revised: 02/06/2016 Document Reviewed: 03/20/2015 Elsevier Interactive Patient Education  2017 [employer] Prevention in the Home Falls can cause injuries. They can happen to people of all ages. There are many things you can do to make your home safe and to help prevent falls. What can I do on the outside of my home?  Regularly fix the edges of walkways and driveways and fix any cracks.  Remove anything that might make you trip as you walk through a door, such as a raised step or threshold.  Trim any bushes or trees on the path to your home.  Use bright outdoor lighting.  Clear any walking paths of anything that might make someone trip, such as rocks or tools.  Regularly check to see if handrails are loose or broken. Make sure that both sides of any steps have handrails.  Any raised decks and porches should have guardrails on the edges.  Have any leaves, snow, or ice cleared regularly.  Use sand or salt on walking paths during winter.  Clean up any spills in your garage right away. This includes oil or grease spills. What can I do in the bathroom?  Use night lights.  Install grab bars by the toilet and in the tub and shower. Do not use towel bars as grab bars.  Use non-skid mats or decals in the tub or shower.  If you need to sit down in the shower, use a plastic, non-slip stool.  Keep the floor dry. Clean up any water that spills on the floor as soon as it happens.  Remove soap buildup in the tub or shower regularly.  Attach bath mats securely with double-sided non-slip rug tape.   Do not have throw rugs and other things on the floor that can make you trip. What can I do in the bedroom?  Use night lights.  Make sure that you have a light by your bed that is easy to reach.  Do not use any sheets or blankets that are too big for your bed. They should not hang down onto the floor.  Have a firm chair that has side arms. You can use this for support while you get dressed.  Do not have throw rugs and other things on the floor that can make you trip. What can I do in the kitchen?  Clean up any spills right away.  Avoid walking on wet floors.  Keep items that you use a lot in easy-to-reach places.  If you need to reach something above you, use a strong step stool that has a grab bar.  Keep electrical cords out of the way.  Do not use floor polish or wax that makes floors slippery. If you must use wax, use non-skid floor wax.  Do not have throw rugs and other things on the floor that can make you trip. What can I do with my stairs?  Do not  leave any items on the stairs.  Make sure that there are handrails on both sides of the stairs and use them. Fix handrails that are broken or loose. Make sure that handrails are as long as the stairways.  Check any carpeting to make sure that it is firmly attached to the stairs. Fix any carpet that is loose or worn.  Avoid having throw rugs at the top or bottom of the stairs. If you do have throw rugs, attach them to the floor with carpet tape.  Make sure that you have a light switch at the top of the stairs and the bottom of the stairs. If you do not have them, ask someone to add them for you. What else can I do to help prevent falls?  Wear shoes that:  Do not have high heels.  Have rubber bottoms.  Are comfortable and fit you well.  Are closed at the toe. Do not wear sandals.  If you use a stepladder:  Make sure that it is fully opened. Do not climb a closed stepladder.  Make sure that both sides of the  stepladder are locked into place.  Ask someone to hold it for you, if possible.  Clearly mark and make sure that you can see:  Any grab bars or handrails.  First and last steps.  Where the edge of each step is.  Use tools that help you move around (mobility aids) if they are needed. These include:  Canes.  Walkers.  Scooters.  Crutches.  Turn on the lights when you go into a dark area. Replace any light bulbs as soon as they burn out.  Set up your furniture so you have a clear path. Avoid moving your furniture around.  If any of your floors are uneven, fix them.  If there are any pets around you, be aware of where they are.  Review your medicines with your doctor. Some medicines can make you feel dizzy. This can increase your chance of falling. Ask your doctor what other things that you can do to help prevent falls. This information is not intended to replace advice given to you by your health care provider. Make sure you discuss any questions you have with your health care provider. Document Released: 03/15/2009 Document Revised: 10/25/2015 Document Reviewed: 06/23/2014 Elsevier Interactive Patient Education  2017 [employer].

## 2019-05-04 NOTE — Progress Notes (Signed)
PCP notes:  Health Maintenance: Decline shingrix- insurance/financial   Abnormal Screenings: none   Patient concerns: none   Nurse concerns: none   Next PCP appt.: 05/10/2019 @ 8:30 am
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
## 2019-05-09 ENCOUNTER — Other Ambulatory Visit: Payer: Self-pay | Admitting: Cardiovascular Disease

## 2019-05-10 ENCOUNTER — Encounter: Payer: Self-pay | Admitting: Family Medicine

## 2019-05-10 ENCOUNTER — Ambulatory Visit (INDEPENDENT_AMBULATORY_CARE_PROVIDER_SITE_OTHER): Payer: Medicare Other | Admitting: Family Medicine

## 2019-05-10 ENCOUNTER — Telehealth: Payer: Self-pay

## 2019-05-10 ENCOUNTER — Other Ambulatory Visit: Payer: Self-pay

## 2019-05-10 VITALS — BP 134/78 | HR 65 | Temp 97.6°F

## 2019-05-10 DIAGNOSIS — Z89611 Acquired absence of right leg above knee: Secondary | ICD-10-CM

## 2019-05-10 DIAGNOSIS — K766 Portal hypertension: Secondary | ICD-10-CM | POA: Diagnosis not present

## 2019-05-10 DIAGNOSIS — N1832 Chronic kidney disease, stage 3b: Secondary | ICD-10-CM

## 2019-05-10 DIAGNOSIS — D539 Nutritional anemia, unspecified: Secondary | ICD-10-CM

## 2019-05-10 DIAGNOSIS — Z7189 Other specified counseling: Secondary | ICD-10-CM | POA: Diagnosis not present

## 2019-05-10 DIAGNOSIS — D696 Thrombocytopenia, unspecified: Secondary | ICD-10-CM

## 2019-05-10 DIAGNOSIS — I255 Ischemic cardiomyopathy: Secondary | ICD-10-CM | POA: Diagnosis not present

## 2019-05-10 DIAGNOSIS — I714 Abdominal aortic aneurysm, without rupture, unspecified: Secondary | ICD-10-CM

## 2019-05-10 DIAGNOSIS — B182 Chronic viral hepatitis C: Secondary | ICD-10-CM | POA: Diagnosis not present

## 2019-05-10 DIAGNOSIS — K3189 Other diseases of stomach and duodenum: Secondary | ICD-10-CM

## 2019-05-10 DIAGNOSIS — R6889 Other general symptoms and signs: Secondary | ICD-10-CM

## 2019-05-10 DIAGNOSIS — E785 Hyperlipidemia, unspecified: Secondary | ICD-10-CM

## 2019-05-10 DIAGNOSIS — I10 Essential (primary) hypertension: Secondary | ICD-10-CM

## 2019-05-10 DIAGNOSIS — K7460 Unspecified cirrhosis of liver: Secondary | ICD-10-CM

## 2019-05-10 NOTE — Telephone Encounter (Signed)
Received faxed order from Lydia Guiles of [facility] needing details.  Placed form in Dr. Synthia Innocent box.

## 2019-05-10 NOTE — Assessment & Plan Note (Signed)
Well compensated. Completed harvoni 2017. Sees liver clinic yearly. Continue to monitor.  MELD-Na score: 13 at 05/03/2019  8:48 AM MELD score: 13 at 05/03/2019  8:48 AM Calculated from: Serum Creatinine: 1.55 mg/dL at 05/03/2019  8:48 AM Serum Sodium: 142 mEq/L (Rounded to 137 mEq/L) at 05/03/2019  8:48 AM Total Bilirubin: 0.8 mg/dL (Rounded to 1 mg/dL) at 05/03/2019  8:48 AM INR(ratio): 1.2 ratio at 05/03/2019  8:48 AM Age: 78 years 1 month

## 2019-05-10 NOTE — Assessment & Plan Note (Signed)
Chronic, stable. Continue current regimen. 

## 2019-05-10 NOTE — Assessment & Plan Note (Signed)
Reviewed need for yearly eval by [facility] after endovascular repair (08/2017)

## 2019-05-10 NOTE — Assessment & Plan Note (Addendum)
Advanced directive discussion - has at home. HCPOA would be wife. Will bring me copy to update chart. 

## 2019-05-10 NOTE — Progress Notes (Signed)
This visit was conducted in person.  BP 134/78 (BP Location: Right Arm, Patient Position: Sitting, Cuff Size: Normal)   Pulse 65   Temp 97.6 F (36.4 C) (Temporal)   SpO2 98%    CC: AMW Subjective:    Patient ID: Stephen [name], male    DOB: [birth year], 78 y.o.   MRN: [id number]  HPI: Stephen [name] is a 78 y.o. male presenting on 05/10/2019 for Annual Exam (Prt 2. )   Saw health advisor last week for medicare wellness visit. Note reviewed.   No exam data present    Clinical Support from 05/04/2019 in [place] at [facility] Total Score  0      Fall Risk  05/04/2019 04/23/2018 04/21/2018 04/16/2017 10/24/2015  Falls in the past year? 1 1 0 No Yes  Comment Patient is a amputee. multiple falls due to loss of balance Emmi Telephone Survey: data to providers prior to load - -  Number falls in past yr: 1 1 - - 2 or more  Injury with Fall? 0 0 - - No  Risk Factor Category  - - - - High Fall Risk  Risk for fall due to : History of fall(s);Impaired balance/gait;Medication side effect Impaired balance/gait;History of fall(s);Impaired mobility - - Impaired mobility;Impaired balance/gait  Risk for fall due to: Comment - - - - R AKA  Follow up Falls evaluation completed;Falls prevention discussed - - - -      Known AAA s/p endovascular aortic stent graft [id number] complicated by endoleak followed regularly by [facility].   Chronic R AKA. Broke prosthesis socket - requests new Rx to [facility].   Ongoing cold intolerance. Ongoing itchy skin - foot, back of knee, lower back. This goes on throughout the year. No rash or skin changes.   Preventative: Colonoscopy 2009 per pt normal (?Amedeo Plenty) never received records. declines repeat at this time.  Prostate cancer screening - age out.  Lung cancer screening - cigar smoker, never cigarettes Flu shot -yearly  Tetanus shot - defer Pneumovax 2018, prevnar 2017 Shingrix - discussed, declines due to cost Advanced directive  discussion - has at home. HCPOA would be wife. Will bring me copy to update chart. Seat belt use discussed Sunscreen use discussed. No changing moles on skin.  Occasional cigar smoker  Alcohol - none  Dentist - q6 mo  Eye exam - doesn't regularly see  Bowel - no constipation Bladder - no incontinence  Moved to [place] 2006 Lives with wife and son, no pets Occupation: retired, was [employer]  Activity: no regular exercise Diet:some water,fruits/vegetables daily      Relevant past medical, surgical, family and social history reviewed and updated as indicated. Interim medical history since our last visit reviewed. Allergies and medications reviewed and updated. Outpatient Medications Prior to Visit  Medication Sig Dispense Refill  . aspirin EC 81 MG tablet Take 81 mg by mouth at bedtime.    [name] atorvastatin (LIPITOR) 40 MG tablet TAKE 1 TABLET BY MOUTH EVERY DAY 90 tablet 1  . carvedilol (COREG) 12.5 MG tablet TAKE 1 TABLET (12.5 MG TOTAL) BY MOUTH 2 (TWO) TIMES DAILY WITH A MEAL. 180 tablet 0  . ezetimibe (ZETIA) 10 MG tablet TAKE 1 TABLET BY MOUTH EVERY DAY 90 tablet 2  . Multiple Vitamin (MULITIVITAMIN WITH MINERALS) TABS Take 1 tablet by mouth daily.    . nitroGLYCERIN (NITROSTAT) 0.4 MG SL tablet Place 1 tablet (0.4 mg total) under the tongue every 5 (  five) minutes as needed for chest pain. 25 tablet 3  . ramipril (ALTACE) 10 MG capsule TAKE 1 CAPSULE BY MOUTH EVERY DAY 90 capsule 0  . spironolactone (ALDACTONE) 25 MG tablet Take 0.5 tablets (12.5 mg total) by mouth daily. 45 tablet 3  . 0.9 %  sodium chloride infusion      No facility-administered medications prior to visit.      Per HPI unless specifically indicated in ROS section below Review of Systems Objective:    BP 134/78 (BP Location: Right Arm, Patient Position: Sitting, Cuff Size: Normal)   Pulse 65   Temp 97.6 F (36.4 C) (Temporal)   SpO2 98%   Wt Readings from Last 3 Encounters:  05/04/19 130 lb (59  kg)  11/04/18 131 lb (59.4 kg)  06/14/18 133 lb (60.3 kg)    Physical Exam Vitals signs and nursing note reviewed.  Constitutional:      General: He is not in acute distress.    Appearance: Normal appearance. He is well-developed. He is not ill-appearing.  HENT:     Head: Normocephalic and atraumatic.     Right Ear: Hearing, tympanic membrane, ear canal and external ear normal.     Left Ear: Hearing, tympanic membrane, ear canal and external ear normal.     Nose: Nose normal.     Mouth/Throat:     Pharynx: Uvula midline.  Eyes:     General: No scleral icterus.    Extraocular Movements: Extraocular movements intact.     Conjunctiva/sclera: Conjunctivae normal.     Pupils: Pupils are equal, round, and reactive to light.  Neck:     Musculoskeletal: Normal range of motion and neck supple.     Thyroid: No thyromegaly or thyroid tenderness.     Vascular: No carotid bruit.     Comments: R thyroid ?nodule Cardiovascular:     Rate and Rhythm: Normal rate and regular rhythm.     Pulses: Normal pulses.          Radial pulses are 2+ on the right side and 2+ on the left side.     Heart sounds: Normal heart sounds. No murmur.  Pulmonary:     Effort: Pulmonary effort is normal. No respiratory distress.     Breath sounds: Normal breath sounds. No wheezing, rhonchi or rales.  Abdominal:     General: Abdomen is flat. Bowel sounds are normal. There is no distension.     Palpations: Abdomen is soft. There is no mass.     Tenderness: There is no abdominal tenderness. There is no guarding or rebound.     Hernia: No hernia is present.  Musculoskeletal:     Left lower leg: No edema.     Comments: Chronic R AKA  Lymphadenopathy:     Cervical: No cervical adenopathy.  Skin:    General: Skin is warm and dry.     Findings: No rash.  Neurological:     General: No focal deficit present.     Mental Status: He is alert and oriented to person, place, and time.     Comments: CN grossly intact,  station and gait intact  Psychiatric:        Mood and Affect: Mood normal.        Behavior: Behavior normal.        Thought Content: Thought content normal.        Judgment: Judgment normal.       Results for orders placed or performed in visit  on 05/03/19  vit d  Result Value Ref Range   VITD 47.39 30.00 - 100.00 ng/mL  Protime-INR  Result Value Ref Range   INR 1.2 (H) 0.8 - 1.0 ratio   Prothrombin Time 14.0 (H) 9.6 - 13.1 sec  CBC with Differential  Result Value Ref Range   WBC 5.2 4.0 - 10.5 K/uL   RBC 3.26 (L) 4.22 - 5.81 Mil/uL   Hemoglobin 11.2 (L) 13.0 - 17.0 g/dL   HCT 33.8 (L) 39.0 - 52.0 %   MCV 103.7 (H) 78.0 - 100.0 fl   MCHC 33.2 30.0 - 36.0 g/dL   RDW 14.9 11.5 - 15.5 %   Platelets 135.0 (L) 150.0 - 400.0 K/uL   Neutrophils Relative % 63.4 43.0 - 77.0 %   Lymphocytes Relative 21.0 12.0 - 46.0 %   Monocytes Relative 7.4 3.0 - 12.0 %   Eosinophils Relative 7.3 (H) 0.0 - 5.0 %   Basophils Relative 0.9 0.0 - 3.0 %   Neutro Abs 3.3 1.4 - 7.7 K/uL   Lymphs Abs 1.1 0.7 - 4.0 K/uL   Monocytes Absolute 0.4 0.1 - 1.0 K/uL   Eosinophils Absolute 0.4 0.0 - 0.7 K/uL   Basophils Absolute 0.0 0.0 - 0.1 K/uL  Comprehensive metabolic panel  Result Value Ref Range   Sodium 142 135 - 145 mEq/L   Potassium 4.7 3.5 - 5.1 mEq/L   Chloride 111 96 - 112 mEq/L   CO2 22 19 - 32 mEq/L   Glucose, Bld 95 70 - 99 mg/dL   BUN 31 (H) 6 - 23 mg/dL   Creatinine, Ser 1.55 (H) 0.40 - 1.50 mg/dL   Total Bilirubin 0.8 0.2 - 1.2 mg/dL   Alkaline Phosphatase 97 39 - 117 U/L   AST 17 0 - 37 U/L   ALT 12 0 - 53 U/L   Total Protein 6.7 6.0 - 8.3 g/dL   Albumin 3.8 3.5 - 5.2 g/dL   GFR 43.46 (L) >60.00 mL/min   Calcium 9.3 8.4 - 10.5 mg/dL  Lipid panel  Result Value Ref Range   Cholesterol 118 0 - 200 mg/dL   Triglycerides 96.0 0.0 - 149.0 mg/dL   HDL 34.00 (L) >39.00 mg/dL   VLDL 19.2 0.0 - 40.0 mg/dL   LDL Cholesterol 65 0 - 99 mg/dL   Total CHOL/HDL Ratio 3    NonHDL 84.23    Lab  Results  Component Value Date   TSH 0.94 06/08/2018    Lab Results  Component Value Date   VITAMINB12 635 06/08/2018    Assessment & Plan:  This visit occurred during the SARS-CoV-2 public health emergency.  Safety protocols were in place, including screening questions prior to the visit, additional usage of staff PPE, and extensive cleaning of exam room while observing appropriate contact time as indicated for disinfecting solutions.   Problem List Items Addressed This Visit      History of   HTN (hypertension)    Chronic, stable. Continue current regimen.       AAA (abdominal aortic aneurysm) without rupture ([place])    Reviewed need for yearly eval by [facility] after endovascular repair (08/2017)        Other   Thrombocytopenia ([place])    Presumed cirrhosis related - actually improvement noted.       Portal hypertensive gastropathy (HCC)    Continue carvedilol.      Macrocytic anemia    RTC 2-3 mo lab work to further evaluate anemia. Anticipate cirrhosis related.  Relevant Orders   CBC with Differential   Folate   IBC panel   Ferritin   Vitamin B12   Ischemic cardiomyopathy, EF 20-25% with apical AK    Chronic, sees cards, upcoming appt.       Hx of AKA (above knee amputation) (HCC) (Chronic)    Prosthesis socket broke - he requests new Rx sent to [facility] - this was faxed today.       Dyslipidemia (Chronic)    Chronic, stable. Continue zetia and lipitor.  The ASCVD Risk score Mikey Bussing DC Jr., et al., 2013) failed to calculate for the following reasons:   The patient has a prior MI or stroke diagnosis       Cold intolerance    Ongoing. Anticipate due to mild anemia + aspirin use. Prior thyroid function normal (2019)      Relevant Orders   TSH   T4, Free   CKD (chronic kidney disease) stage 3, GFR 30-59 ml/min    Reviewed with patient, encouraged good water intake.       Chronic hepatitis C with cirrhosis ([place])    Well compensated. Completed harvoni 2017.  Sees liver clinic yearly. Continue to monitor.  MELD-Na score: 13 at 05/03/2019  8:48 AM MELD score: 13 at 05/03/2019  8:48 AM Calculated from: Serum Creatinine: 1.55 mg/dL at 05/03/2019  8:48 AM Serum Sodium: 142 mEq/L (Rounded to 137 mEq/L) at 05/03/2019  8:48 AM Total Bilirubin: 0.8 mg/dL (Rounded to 1 mg/dL) at 05/03/2019  8:48 AM INR(ratio): 1.2 ratio at 05/03/2019  8:48 AM Age: 69 years 1 month       Advanced care planning/counseling discussion - Primary    Advanced directive discussion - has at home. HCPOA would be wife. Will bring me copy to update chart.          No orders of the defined types were placed in this encounter.  Orders Placed This Encounter  Procedures  . CBC with Differential    Standing Status:   Future    Standing Expiration Date:   05/09/2020  . Folate    Standing Status:   Future    Standing Expiration Date:   05/09/2020  . IBC panel    Standing Status:   Future    Standing Expiration Date:   05/09/2020  . Ferritin    Standing Status:   Future    Standing Expiration Date:   05/09/2020  . Vitamin B12    Standing Status:   Future    Standing Expiration Date:   05/09/2020  . TSH    Standing Status:   Future    Standing Expiration Date:   05/09/2020  . T4, Free    Standing Status:   Future    Standing Expiration Date:   05/09/2020    Patient instructions: Bring [place] a copy of your living will to update your chart.  Call vascular surgeons to schedule follow up visit ([place]) Try moisturizing cream to skin especially in winter months.  You are doing great today. Return as needed or in 1 year for next wellness visit.  Schedule lab visit in 2-3 months to check anemia again.   Follow up plan: Return in about 1 year (around 05/09/2020) for medicare wellness visit, follow up visit.  [name], MD

## 2019-05-10 NOTE — Assessment & Plan Note (Addendum)
Ongoing. Anticipate due to mild anemia + aspirin use. Prior thyroid function normal (2019)

## 2019-05-10 NOTE — Assessment & Plan Note (Addendum)
Chronic, sees cards, upcoming appt.

## 2019-05-10 NOTE — Assessment & Plan Note (Signed)
Continue carvedilol 

## 2019-05-10 NOTE — Telephone Encounter (Signed)
Filled and in Lisa's box 

## 2019-05-10 NOTE — Patient Instructions (Addendum)
Bring [place] a copy of your living will to update your chart.  Call vascular surgeons to schedule follow up visit ([place]) Try moisturizing cream to skin especially in winter months.  You are doing great today. Return as needed or in 1 year for next wellness visit.  Schedule lab visit in 2-3 months to check anemia again.   Health Maintenance After Age 79 After age 8, you are at a higher risk for certain long-term diseases and infections as well as injuries from falls. Falls are a major cause of broken bones and head injuries in people who are older than age 66. Getting regular preventive care can help to keep you healthy and well. Preventive care includes getting regular testing and making lifestyle changes as recommended by your health care provider. Talk with your health care provider about:  Which screenings and tests you should have. A screening is a test that checks for a disease when you have no symptoms.  A diet and exercise plan that is right for you. What should I know about screenings and tests to prevent falls? Screening and testing are the best ways to find a health problem early. Early diagnosis and treatment give you the best chance of managing medical conditions that are common after age 27. Certain conditions and lifestyle choices may make you more likely to have a fall. Your health care provider may recommend:  Regular vision checks. Poor vision and conditions such as cataracts can make you more likely to have a fall. If you wear glasses, make sure to get your prescription updated if your vision changes.  Medicine review. Work with your health care provider to regularly review all of the medicines you are taking, including over-the-counter medicines. Ask your health care provider about any side effects that may make you more likely to have a fall. Tell your health care provider if any medicines that you take make you feel dizzy or sleepy.  Osteoporosis screening. Osteoporosis is a  condition that causes the bones to get weaker. This can make the bones weak and cause them to break more easily.  Blood pressure screening. Blood pressure changes and medicines to control blood pressure can make you feel dizzy.  Strength and balance checks. Your health care provider may recommend certain tests to check your strength and balance while standing, walking, or changing positions.  Foot health exam. Foot pain and numbness, as well as not wearing proper footwear, can make you more likely to have a fall.  Depression screening. You may be more likely to have a fall if you have a fear of falling, feel emotionally low, or feel unable to do activities that you used to do.  Alcohol use screening. Using too much alcohol can affect your balance and may make you more likely to have a fall. What actions can I take to lower my risk of falls? General instructions  Talk with your health care provider about your risks for falling. Tell your health care provider if: ? You fall. Be sure to tell your health care provider about all falls, even ones that seem minor. ? You feel dizzy, sleepy, or off-balance.  Take over-the-counter and prescription medicines only as told by your health care provider. These include any supplements.  Eat a healthy diet and maintain a healthy weight. A healthy diet includes low-fat dairy products, low-fat (lean) meats, and fiber from whole grains, beans, and lots of fruits and vegetables. Home safety  Remove any tripping hazards, such as rugs, cords,  and clutter.  Install safety equipment such as grab bars in bathrooms and safety rails on stairs.  Keep rooms and walkways well-lit. Activity   Follow a regular exercise program to stay fit. This will help you maintain your balance. Ask your health care provider what types of exercise are appropriate for you.  If you need a cane or walker, use it as recommended by your health care provider.  Wear supportive shoes  that have nonskid soles. Lifestyle  Do not drink alcohol if your health care provider tells you not to drink.  If you drink alcohol, limit how much you have: ? 0-1 drink a day for women. ? 0-2 drinks a day for men.  Be aware of how much alcohol is in your drink. In the [place] one drink equals one typical bottle of beer (12 oz), one-half glass of wine (5 oz), or one shot of hard liquor (1 oz).  Do not use any products that contain nicotine or tobacco, such as cigarettes and e-cigarettes. If you need help quitting, ask your health care provider. Summary  Having a healthy lifestyle and getting preventive care can help to protect your health and wellness after age 75.  Screening and testing are the best way to find a health problem early and help you avoid having a fall. Early diagnosis and treatment give you the best chance for managing medical conditions that are more common for people who are older than age 30.  Falls are a major cause of broken bones and head injuries in people who are older than age 102. Take precautions to prevent a fall at home.  Work with your health care provider to learn what changes you can make to improve your health and wellness and to prevent falls. This information is not intended to replace advice given to you by your health care provider. Make sure you discuss any questions you have with your health care provider. Document Released: 04/01/2017 Document Revised: 09/09/2018 Document Reviewed: 04/01/2017 Elsevier Patient Education  2020 [employer].

## 2019-05-10 NOTE — Assessment & Plan Note (Signed)
Chronic, stable. Continue zetia and lipitor.  The ASCVD Risk score Stephen Bussing DC Jr., et al., 2013) failed to calculate for the following reasons:   The patient has a prior MI or stroke diagnosis

## 2019-05-10 NOTE — Assessment & Plan Note (Signed)
Prosthesis socket broke - he requests new Rx sent to [facility] - this was faxed today.

## 2019-05-10 NOTE — Assessment & Plan Note (Signed)
Reviewed with patient, encouraged good water intake.

## 2019-05-10 NOTE — Assessment & Plan Note (Signed)
Presumed cirrhosis related - actually improvement noted.

## 2019-05-10 NOTE — Assessment & Plan Note (Signed)
RTC 2-3 mo lab work to further evaluate anemia. Anticipate cirrhosis related.

## 2019-05-11 NOTE — Telephone Encounter (Signed)
Faxed form.

## 2019-05-13 ENCOUNTER — Other Ambulatory Visit: Payer: Self-pay

## 2019-05-13 ENCOUNTER — Encounter: Payer: Self-pay | Admitting: Cardiovascular Disease

## 2019-05-13 ENCOUNTER — Ambulatory Visit (INDEPENDENT_AMBULATORY_CARE_PROVIDER_SITE_OTHER): Payer: Medicare Other | Admitting: Cardiovascular Disease

## 2019-05-13 VITALS — BP 118/60 | HR 61 | Ht 67.0 in | Wt 131.0 lb

## 2019-05-13 DIAGNOSIS — E785 Hyperlipidemia, unspecified: Secondary | ICD-10-CM | POA: Diagnosis not present

## 2019-05-13 DIAGNOSIS — I255 Ischemic cardiomyopathy: Secondary | ICD-10-CM

## 2019-05-13 DIAGNOSIS — I714 Abdominal aortic aneurysm, without rupture, unspecified: Secondary | ICD-10-CM

## 2019-05-13 DIAGNOSIS — Z955 Presence of coronary angioplasty implant and graft: Secondary | ICD-10-CM

## 2019-05-13 DIAGNOSIS — B182 Chronic viral hepatitis C: Secondary | ICD-10-CM | POA: Diagnosis not present

## 2019-05-13 NOTE — Patient Instructions (Signed)
Medication Instructions:  Your physician recommends that you continue on your current medications as directed. Please refer to the Current Medication list given to you today.  *If you need a refill on your cardiac medications before your next appointment, please call your pharmacy*  Lab Work: NONE   Testing/Procedures: Your physician has requested that you have an echocardiogram. Echocardiography is a painless test that uses sound waves to create images of your heart. It provides your doctor with information about the size and shape of your heart and how well your heart's chambers and valves are working. This procedure takes approximately one hour. There are no restrictions for this procedure. IN 6 MONTHS   Follow-Up: At [facility], you and your health needs are our priority.  As part of our continuing mission to provide you with exceptional heart care, we have created designated Provider Care Teams.  These Care Teams include your primary Cardiologist (physician) and Advanced Practice Providers (APPs -  Physician Assistants and Nurse Practitioners) who all work together to provide you with the care you need, when you need it.  Your next appointment:   6 month(s)  The format for your next appointment:   In Person  Provider:   You may see Shelva Majestic, MD or one of the following Advanced Practice Providers on your designated Care Team:    Almyra Deforest, PA-C  Fabian Sharp, PA-C or   Roby Lofts, [place]
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
## 2019-05-15 ENCOUNTER — Encounter: Payer: Self-pay | Admitting: Cardiovascular Disease

## 2019-07-17 ENCOUNTER — Ambulatory Visit: Payer: Medicare Other | Attending: Internal Medicine

## 2019-07-17 DIAGNOSIS — Z23 Encounter for immunization: Secondary | ICD-10-CM

## 2019-07-17 NOTE — Progress Notes (Signed)
   Covid-19 Vaccination Clinic  Name:  Stephen [name]    MRN: [id number] DOB: 1941-04-11  07/17/2019  [name] was observed post Covid-19 immunization for 15 minutes without incidence. He was provided with Vaccine Information Sheet and instruction to access the V-Safe system.   [name] was instructed to call 911 with any severe reactions post vaccine: [name] Difficulty breathing  . Swelling of your face and throat  . A fast heartbeat  . A bad rash all over your body  . Dizziness and weakness    Immunizations Administered    Name Date Dose VIS Date Route   Pfizer COVID-19 Vaccine 07/17/2019  9:49 AM 0.3 mL 05/13/2019 Intramuscular   Manufacturer: [place]   Lot: [id number]   [place]: [id number]

## 2019-07-25 ENCOUNTER — Other Ambulatory Visit: Payer: Self-pay | Admitting: Cardiovascular Disease

## 2019-07-27 ENCOUNTER — Telehealth: Payer: Self-pay | Admitting: Cardiovascular Disease

## 2019-07-27 MED ORDER — CARVEDILOL 12.5 MG PO TABS: 12.5000 mg | ORAL_TABLET | Freq: Two times a day (BID) | ORAL | 3 refills | Status: DC

## 2019-07-27 MED ORDER — RAMIPRIL 10 MG PO CAPS: ORAL_CAPSULE | ORAL | 3 refills | Status: DC

## 2019-07-27 NOTE — Telephone Encounter (Signed)
rx sent to pharmacy.patient aware. 

## 2019-07-27 NOTE — Telephone Encounter (Signed)
New Message  Pt c/o medication issue:  1. Name of Medication: carvedilol (COREG) 12.5 MG tablet, ramipril (ALTACE) 10 MG capsule  2. How are you currently taking this medication (dosage and times per day)? As written  3. Are you having a reaction (difficulty breathing--STAT)? No  4. What is your medication issue? Pt is currently out of medication and refills for both medicines. Both medications need new prescriptions

## 2019-08-09 ENCOUNTER — Ambulatory Visit: Payer: Medicare Other | Attending: Internal Medicine

## 2019-08-09 DIAGNOSIS — Z23 Encounter for immunization: Secondary | ICD-10-CM

## 2019-08-09 NOTE — Progress Notes (Signed)
   Covid-19 Vaccination Clinic  Name:  Stephen [name]    MRN: [id number] DOB: Oct 30, 1940  08/09/2019  Stephen [name] was observed post Covid-19 immunization for 15 minutes without incident. He was provided with Vaccine Information Sheet and instruction to access the V-Safe system.   Stephen [name] was instructed to call 911 with any severe reactions post vaccine: [name] Difficulty breathing  . Swelling of face and throat  . A fast heartbeat  . A bad rash all over body  . Dizziness and weakness   Immunizations Administered    Name Date Dose VIS Date Route   Pfizer COVID-19 Vaccine 08/09/2019 10:39 AM 0.3 mL 05/13/2019 Intramuscular   Manufacturer: [place]   Lot: [id number]   [place]: [id number]

## 2019-08-15 ENCOUNTER — Telehealth: Payer: Self-pay | Admitting: *Deleted

## 2019-08-15 NOTE — Telephone Encounter (Signed)
Patient's wife called stating that he went last Tuesday to get his second covid vaccine and he fell while he was there. Mrs. Hickox stated that Tuesday night he was concerned about being constipated for about 5-6 days, so he took a laxative Ex lax.. Mrs. Zappia stated that he started with diarrhea that night and had it for 3 days. Patient's wife stated that she is sure the diarrhea was caused by the laxative he took. Patient's wife denies that he has had a fever, cough or any other  covid symptoms. Patient's wife stated that he did have a bowel movement that was more formed today. Patient's wife stated that he has been complaining of his back hurting since the fall Tuesday. Mrs. Delrossi stated that he has been falling a lot and he needs an order for a wheelchair.  Appointment scheduled for tomorrow with Dr. Danise Mina.

## 2019-08-16 ENCOUNTER — Ambulatory Visit (INDEPENDENT_AMBULATORY_CARE_PROVIDER_SITE_OTHER)
Admission: RE | Admit: 2019-08-16 | Discharge: 2019-08-16 | Disposition: A | Discharge status: Home / Self Care | Payer: Medicare Other | Source: Ambulatory Visit | Attending: Family Medicine | Admitting: Family Medicine

## 2019-08-16 ENCOUNTER — Other Ambulatory Visit: Payer: Self-pay

## 2019-08-16 ENCOUNTER — Ambulatory Visit (INDEPENDENT_AMBULATORY_CARE_PROVIDER_SITE_OTHER): Payer: Medicare Other | Admitting: Family Medicine

## 2019-08-16 ENCOUNTER — Telehealth: Payer: Self-pay | Admitting: Family Medicine

## 2019-08-16 ENCOUNTER — Encounter: Payer: Self-pay | Admitting: Family Medicine

## 2019-08-16 VITALS — BP 128/64 | HR 67 | Temp 97.6°F | Ht 67.0 in | Wt 120.3 lb

## 2019-08-16 DIAGNOSIS — K766 Portal hypertension: Secondary | ICD-10-CM

## 2019-08-16 DIAGNOSIS — D696 Thrombocytopenia, unspecified: Secondary | ICD-10-CM | POA: Diagnosis not present

## 2019-08-16 DIAGNOSIS — R296 Repeated falls: Secondary | ICD-10-CM

## 2019-08-16 DIAGNOSIS — K7460 Unspecified cirrhosis of liver: Secondary | ICD-10-CM | POA: Diagnosis not present

## 2019-08-16 DIAGNOSIS — M545 Low back pain, unspecified: Secondary | ICD-10-CM | POA: Insufficient documentation

## 2019-08-16 DIAGNOSIS — N1832 Chronic kidney disease, stage 3b: Secondary | ICD-10-CM

## 2019-08-16 DIAGNOSIS — D539 Nutritional anemia, unspecified: Secondary | ICD-10-CM | POA: Diagnosis not present

## 2019-08-16 DIAGNOSIS — R2681 Unsteadiness on feet: Secondary | ICD-10-CM | POA: Diagnosis not present

## 2019-08-16 DIAGNOSIS — Z89611 Acquired absence of right leg above knee: Secondary | ICD-10-CM

## 2019-08-16 DIAGNOSIS — R5383 Other fatigue: Secondary | ICD-10-CM | POA: Diagnosis not present

## 2019-08-16 DIAGNOSIS — B182 Chronic viral hepatitis C: Secondary | ICD-10-CM | POA: Diagnosis not present

## 2019-08-16 DIAGNOSIS — K3189 Other diseases of stomach and duodenum: Secondary | ICD-10-CM

## 2019-08-16 DIAGNOSIS — I714 Abdominal aortic aneurysm, without rupture, unspecified: Secondary | ICD-10-CM

## 2019-08-16 DIAGNOSIS — R6889 Other general symptoms and signs: Secondary | ICD-10-CM | POA: Diagnosis not present

## 2019-08-16 DIAGNOSIS — R6881 Early satiety: Secondary | ICD-10-CM

## 2019-08-16 DIAGNOSIS — I255 Ischemic cardiomyopathy: Secondary | ICD-10-CM | POA: Diagnosis not present

## 2019-08-16 LAB — CBC WITH DIFFERENTIAL/PLATELET
Basophils Absolute: 0 10*3/uL (ref 0.0–0.1)
Basophils Relative: 0.4 % (ref 0.0–3.0)
Eosinophils Absolute: 0.2 10*3/uL (ref 0.0–0.7)
Eosinophils Relative: 2.2 % (ref 0.0–5.0)
HCT: 26.6 % — ABNORMAL LOW (ref 39.0–52.0)
Hemoglobin: 9.2 g/dL — ABNORMAL LOW (ref 13.0–17.0)
Lymphocytes Relative: 4.3 % — ABNORMAL LOW (ref 12.0–46.0)
Lymphs Abs: 0.4 10*3/uL — ABNORMAL LOW (ref 0.7–4.0)
MCHC: 34.5 g/dL (ref 30.0–36.0)
MCV: 99.7 fl (ref 78.0–100.0)
Monocytes Absolute: 0.7 10*3/uL (ref 0.1–1.0)
Monocytes Relative: 6.7 % (ref 3.0–12.0)
Neutro Abs: 8.6 10*3/uL — ABNORMAL HIGH (ref 1.4–7.7)
Neutrophils Relative %: 86.4 % — ABNORMAL HIGH (ref 43.0–77.0)
Platelets: 228 10*3/uL (ref 150.0–400.0)
RBC: 2.67 Mil/uL — ABNORMAL LOW (ref 4.22–5.81)
RDW: 14.7 % (ref 11.5–15.5)
WBC: 9.9 10*3/uL (ref 4.0–10.5)

## 2019-08-16 LAB — COMPREHENSIVE METABOLIC PANEL
ALT: 117 U/L — ABNORMAL HIGH (ref 0–53)
AST: 113 U/L — ABNORMAL HIGH (ref 0–37)
Albumin: 2.7 g/dL — ABNORMAL LOW (ref 3.5–5.2)
Alkaline Phosphatase: 104 U/L (ref 39–117)
BUN: 71 mg/dL — ABNORMAL HIGH (ref 6–23)
CO2: 17 mEq/L — ABNORMAL LOW (ref 19–32)
Calcium: 8.2 mg/dL — ABNORMAL LOW (ref 8.4–10.5)
Chloride: 107 mEq/L (ref 96–112)
Creatinine, Ser: 3.5 mg/dL — ABNORMAL HIGH (ref 0.40–1.50)
GFR: 16.96 mL/min — ABNORMAL LOW (ref >60.00)
Glucose, Bld: 128 mg/dL — ABNORMAL HIGH (ref 70–99)
Potassium: 4.9 mEq/L (ref 3.5–5.1)
Sodium: 134 mEq/L — ABNORMAL LOW (ref 135–145)
Total Bilirubin: 0.7 mg/dL (ref 0.2–1.2)
Total Protein: 5.6 g/dL — ABNORMAL LOW (ref 6.0–8.3)

## 2019-08-16 LAB — IBC PANEL
Iron: 22 ug/dL — ABNORMAL LOW (ref 42–165)
Saturation Ratios: 14.6 % — ABNORMAL LOW (ref 20.0–50.0)
Transferrin: 108 mg/dL — ABNORMAL LOW (ref 212.0–360.0)

## 2019-08-16 LAB — FOLATE: Folate: >23.7 ng/mL (ref >5.9)

## 2019-08-16 LAB — TSH: TSH: 0.82 u[IU]/mL (ref 0.35–4.50)

## 2019-08-16 LAB — VITAMIN B12: Vitamin B-12: >1500 pg/mL — ABNORMAL HIGH (ref 211–911)

## 2019-08-16 LAB — FERRITIN: Ferritin: 628.1 ng/mL — ABNORMAL HIGH (ref 22.0–322.0)

## 2019-08-16 LAB — T4, FREE: Free T4: 1.16 ng/dL (ref 0.60–1.60)

## 2019-08-16 NOTE — Progress Notes (Addendum)
This visit was conducted in person.  BP 128/64 (BP Location: Right Arm, Patient Position: Sitting, Cuff Size: Normal)   Pulse 67   Temp 97.6 F (36.4 C) (Temporal)   Ht 5\' 7"  (1.702 m)   Wt 120 lb 5 oz (54.6 kg)   SpO2 98%   BMI 18.84 kg/m    CC: falls, constipation Subjective:    Patient ID: Stephen [name], male    DOB: June 23, 1940, 79 y.o.   MRN: [id number]  HPI: RITVIK MCZEAL is a 79 y.o. male presenting on 08/16/2019 for Fall (Has had multiple falls, 6-7 within last month. Most recent fall, 08/09/19.  Suffered laceration to lower arm.  Also, c/o low back pain, due to the falls, which is disturbing sleep.  States his leg can now only hold his weight for so long, then gives out.  Pt accompanied by wife, Santiago Glad- temp 97.9.) and Constipation (C/o episodes of constipation.  Wife is concerned that pt is not eating. )   3 wks ago had episode of lower abdominal pain treated with hot tea and this resolved after a BM. 2 wks ago started having constipation. Last week took a laxative - which caused diarrhea. Denies blood in stool. No appetite over the past week. 10 lb weight loss. Wife thinks he's lethargic. No diet or med changes. No abd pain, chest pain, HA, lightheadedness, palpitations. Stays cold. Wife thinks he's moving overall slowed. Mentation is intact. No urinary symptoms or leg swelling.   Progressive weakness noted - had a bad fall at covid vaccine clinic.  Worsening R leg pain since fall. Easily gets weak. Feels L leg can't hold his weight any longer. Persistent lower back pain since fall. Uses heating pad.  Has had 7 falls in the past 2 months.  States he instantly loses his balance when he stands up but denies vertigo, dizziness, paresthesias.  Last week had severe phantom pains.  They request a Rx for wheelchair.   Endorses early satiety.   Known hep C compensated cirrhosis completed harvoni 2017 followed by liver [facility]cinic.  H/o AKA due ot trauma (1965).   Completed Pfizer  vaccine series 08/09/2019.   Did not return for labs 05/2019.      Relevant past medical, surgical, family and social history reviewed and updated as indicated. Interim medical history since our last visit reviewed. Allergies and medications reviewed and updated. Outpatient Medications Prior to Visit  Medication Sig Dispense Refill  . aspirin EC 81 MG tablet Take 81 mg by mouth at bedtime.    [name] atorvastatin (LIPITOR) 40 MG tablet TAKE 1 TABLET BY MOUTH EVERY DAY 90 tablet 1  . carvedilol (COREG) 12.5 MG tablet Take 1 tablet (12.5 mg total) by mouth 2 (two) times daily with a meal. 180 tablet 3  . ezetimibe (ZETIA) 10 MG tablet TAKE 1 TABLET BY MOUTH EVERY DAY 90 tablet 2  . Multiple Vitamin (MULITIVITAMIN WITH MINERALS) TABS Take 1 tablet by mouth daily.    . ramipril (ALTACE) 10 MG capsule TAKE 1 CAPSULE BY MOUTH EVERY DAY 90 capsule 3  . nitroGLYCERIN (NITROSTAT) 0.4 MG SL tablet Place 1 tablet (0.4 mg total) under the tongue every 5 (five) minutes as needed for chest pain. 25 tablet 3  . spironolactone (ALDACTONE) 25 MG tablet Take 0.5 tablets (12.5 mg total) by mouth daily. 45 tablet 3   No facility-administered medications prior to visit.     Per HPI unless specifically indicated in ROS section below Review of  Systems Objective:    BP 128/64 (BP Location: Right Arm, Patient Position: Sitting, Cuff Size: Normal)   Pulse 67   Temp 97.6 F (36.4 C) (Temporal)   Ht 5\' 7"  (1.702 m)   Wt 120 lb 5 oz (54.6 kg)   SpO2 98%   BMI 18.84 kg/m   Wt Readings from Last 3 Encounters:  08/16/19 120 lb 5 oz (54.6 kg)  05/13/19 131 lb (59.4 kg)  05/04/19 130 lb (59 kg)    Physical Exam Vitals and nursing note reviewed.  Constitutional:      Appearance: Normal appearance. He is underweight.     Comments: Sitting in wheelchair  Cardiovascular:     Rate and Rhythm: Normal rate and regular rhythm.     Pulses: Normal pulses.     Heart sounds: Normal heart sounds. No murmur.     Comments:  Distant heart sounds Pulmonary:     Effort: Pulmonary effort is normal. No respiratory distress.     Breath sounds: Normal breath sounds. No wheezing, rhonchi or rales.  Musculoskeletal:     Left lower leg: No edema.     Comments:  S/p R AKA Discomfort lower lumbar midline spine Tenderness to bilateral lumbar paraspinous mm   Skin:    General: Skin is warm and dry.     Findings: No rash.     Comments: Abrasions and scabbed laceration to R forearm  Neurological:     Mental Status: He is alert.     Sensory: Sensation is intact.     Motor: Motor function is intact.     Coordination: Coordination normal. Finger-Nose-Finger Test normal. Rapid alternating movements normal.     Comments:  5/5 strength BUE, LLE Grip strength intact Sensation intact BUE  Psychiatric:        Mood and Affect: Mood normal.        Behavior: Behavior normal.       Results for orders placed or performed in visit on 05/03/19  vit d  Result Value Ref Range   VITD 47.39 30.00 - 100.00 ng/mL  Protime-INR  Result Value Ref Range   INR 1.2 (H) 0.8 - 1.0 ratio   Prothrombin Time 14.0 (H) 9.6 - 13.1 sec  CBC with Differential  Result Value Ref Range   WBC 5.2 4.0 - 10.5 K/uL   RBC 3.26 (L) 4.22 - 5.81 Mil/uL   Hemoglobin 11.2 (L) 13.0 - 17.0 g/dL   HCT 33.8 (L) 39.0 - 52.0 %   MCV 103.7 (H) 78.0 - 100.0 fl   MCHC 33.2 30.0 - 36.0 g/dL   RDW 14.9 11.5 - 15.5 %   Platelets 135.0 (L) 150.0 - 400.0 K/uL   Neutrophils Relative % 63.4 43.0 - 77.0 %   Lymphocytes Relative 21.0 12.0 - 46.0 %   Monocytes Relative 7.4 3.0 - 12.0 %   Eosinophils Relative 7.3 (H) 0.0 - 5.0 %   Basophils Relative 0.9 0.0 - 3.0 %   Neutro Abs 3.3 1.4 - 7.7 K/uL   Lymphs Abs 1.1 0.7 - 4.0 K/uL   Monocytes Absolute 0.4 0.1 - 1.0 K/uL   Eosinophils Absolute 0.4 0.0 - 0.7 K/uL   Basophils Absolute 0.0 0.0 - 0.1 K/uL  Comprehensive metabolic panel  Result Value Ref Range   Sodium 142 135 - 145 mEq/L   Potassium 4.7 3.5 - 5.1 mEq/L     Chloride 111 96 - 112 mEq/L   CO2 22 19 - 32 mEq/L   Glucose, Bld  95 70 - 99 mg/dL   BUN 31 (H) 6 - 23 mg/dL   Creatinine, Ser 1.55 (H) 0.40 - 1.50 mg/dL   Total Bilirubin 0.8 0.2 - 1.2 mg/dL   Alkaline Phosphatase 97 39 - 117 U/L   AST 17 0 - 37 U/L   ALT 12 0 - 53 U/L   Total Protein 6.7 6.0 - 8.3 g/dL   Albumin 3.8 3.5 - 5.2 g/dL   GFR 43.46 (L) >60.00 mL/min   Calcium 9.3 8.4 - 10.5 mg/dL  Lipid panel  Result Value Ref Range   Cholesterol 118 0 - 200 mg/dL   Triglycerides 96.0 0.0 - 149.0 mg/dL   HDL 34.00 (L) >39.00 mg/dL   VLDL 19.2 0.0 - 40.0 mg/dL   LDL Cholesterol 65 0 - 99 mg/dL   Total CHOL/HDL Ratio 3    NonHDL 84.23    Assessment & Plan:  This visit occurred during the SARS-CoV-2 public health emergency.  Safety protocols were in place, including screening questions prior to the visit, additional usage of staff PPE, and extensive cleaning of exam room while observing appropriate contact time as indicated for disinfecting solutions.   Problem List Items Addressed This Visit      History of   AAA (abdominal aortic aneurysm) without rupture ([place])    Overdue for [facility] f/u - last saw Dr Trula Slade 06/2018        Other   Thrombocytopenia ([place])    Cirrhosis related. Update labs.       Recurrent falls - Primary    Progressive weakness acutely worse over the past year - affecting ambulation. Previously was very mobile despite remote traumatic AKA. Will set up wit [facility] PT. Will provide Rx for lightweight portable wheelchair. Will check further labs today.       Relevant Orders   Ambulatory referral to [facility]   DG Lumbar Spine Complete   Portal hypertensive gastropathy (HCC)    Continue carvedilol      Macrocytic anemia   Lumbar pain    After fall last week. Check lumbar films r/o vertebral fracture.      Relevant Orders   Ambulatory referral to [place] Lumbar Spine Complete   Ischemic cardiomyopathy, EF 20-25% with apical AK   Hx of AKA (above  knee amputation) (HCC) (Chronic)   Relevant Orders   Ambulatory referral to [place] weakness    Acutely worsened fatigue, cold intolerance. Check anemia, thyroid function.       Gait instability    Acutely worse the past 1-2 months. Check labwork. Refer for HHPT. See above.       Relevant Orders   Ambulatory referral to Home Health   Early satiety    With 10 lb weight loss. Await labwork, if unrevealing low threshold to refer to GI vs order abdominal imaging. Reviewed latest CTA 06/2018 - normal pancreas.       Cold intolerance    Update TFTs and anemia panel.       CKD (chronic kidney disease) stage 3, GFR 30-59 ml/min    Update Cr in setting of weakness, falls, failure to thrive.       Chronic hepatitis C with cirrhosis ([place])    Followed by liver clinic but seems last seen 06/2018 - due for f/u. Update labs. Unable to add INR to blood already drawn today so cannot calculate MELD score.       Relevant Orders   Comprehensive metabolic  panel   Ambulatory referral to [place] ===> Stony has a mobility limitation (leg amputation) that impairs their ability to do one or more MRADLs in customary locations in the home, patient cannot use a cane, crutches, or walker to sufficiently resolve the issue, patient has a caregiver than can provide assistance. Due to heavy weight and size, patient and/or wife would not be able to handle a wheelchair.   No orders of the defined types were placed in this encounter.  Orders Placed This Encounter  Procedures  . DG Lumbar Spine Complete    Standing Status:   Future    Number of Occurrences:   1    Standing Expiration Date:   10/15/2020    Order Specific Question:   Reason for Exam (SYMPTOM  OR DIAGNOSIS REQUIRED)    Answer:   lower midline lumbar pain after multiple falls r/o compression fracture    Order Specific Question:   Preferred imaging location?    Answer:   [facility]    Order Specific  Question:   Radiology Contrast Protocol - do NOT remove file path    Answer:   [email or web address]  . Comprehensive metabolic panel  . Ambulatory referral to Home Health    Referral Priority:   Routine    Referral Type:   Home Health Care    Referral Reason:   Specialty Services Required    Requested Specialty:   [place]    Number of Visits Requested:   1    Follow up plan: Return if symptoms worsen or fail to improve.  [name], MD

## 2019-08-16 NOTE — Assessment & Plan Note (Addendum)
With 10 lb weight loss. Await labwork, if unrevealing low threshold to refer to GI vs order abdominal imaging. Reviewed latest CTA 06/2018 - normal pancreas.

## 2019-08-16 NOTE — Assessment & Plan Note (Signed)
After fall last week. Check lumbar films r/o vertebral fracture.

## 2019-08-16 NOTE — Assessment & Plan Note (Signed)
Update Cr in setting of weakness, falls, failure to thrive.

## 2019-08-16 NOTE — Assessment & Plan Note (Signed)
Overdue for VVS f/u - last saw Dr Trula Slade 06/2018

## 2019-08-16 NOTE — Assessment & Plan Note (Addendum)
Acutely worsened fatigue, cold intolerance. Check anemia, thyroid function.

## 2019-08-16 NOTE — Assessment & Plan Note (Addendum)
Continue carvedilol 

## 2019-08-16 NOTE — Assessment & Plan Note (Addendum)
Acutely worse the past 1-2 months. Check labwork. Refer for HHPT. See above.

## 2019-08-16 NOTE — Assessment & Plan Note (Signed)
Progressive weakness acutely worse over the past year - affecting ambulation. Previously was very mobile despite remote traumatic AKA. Will set up wit [facility] PT. Will provide Rx for lightweight portable wheelchair. Will check further labs today.

## 2019-08-16 NOTE — Assessment & Plan Note (Signed)
Cirrhosis related. Update labs.

## 2019-08-16 NOTE — Patient Instructions (Signed)
Labs today Xray today Home health referral placed Wheelchair Rx provided today - to take to local medical supply store. Let us know if any questions or not feeling better over next few days.

## 2019-08-16 NOTE — Addendum Note (Signed)
Addended by: Ellamae Sia on: 08/16/2019 02:16 PM   Modules accepted: Orders

## 2019-08-16 NOTE — Assessment & Plan Note (Signed)
Update TFTs and anemia panel.

## 2019-08-16 NOTE — Telephone Encounter (Signed)
Patient's wife, Santiago Glad, called. Patient was given a rx for a lightweight wheelchair. The lightweight chair has wheels that are too large and it's heavier. Karen's requesting a rx for a transport chair. It would be much easier.  The rx can be faxed to [facility] fax # [phone].

## 2019-08-16 NOTE — Telephone Encounter (Signed)
Rx written and in Lisa's box.  

## 2019-08-16 NOTE — Assessment & Plan Note (Signed)
Followed by liver clinic but seems last seen 06/2018 - due for f/u. Update labs. Unable to add INR to blood already drawn today so cannot calculate MELD score.

## 2019-08-17 ENCOUNTER — Telehealth: Payer: Self-pay

## 2019-08-17 LAB — PATHOLOGIST SMEAR REVIEW

## 2019-08-17 NOTE — Telephone Encounter (Signed)
Noted! Thank you

## 2019-08-17 NOTE — Telephone Encounter (Signed)
Faxed rx

## 2019-08-17 NOTE — Telephone Encounter (Signed)
Darlene with [facility] left v/m that a referral was received for pt and when Carlyon Shadow called today to do visit and pts family requested that [facility] not come out until 08/22/19, Monday. This start of care will be outside the 48 hour window of time. At this time pt is scheduled for [facility] visit on 08/22/19. I f Dr Danise Mina disagrees with this appt scheduled at family's request please call Darlene.

## 2019-08-18 ENCOUNTER — Telehealth: Payer: Self-pay | Admitting: Family Medicine

## 2019-08-18 NOTE — Telephone Encounter (Signed)
Stephen [name] with [employer] calling requesting additional information be faxed  They need most recent OV notes and insurance information faxed over before they can process the order for the lift chair.   Fax # [phone]  ATTN: Marya Amsler  Please advise, thanks.

## 2019-08-18 NOTE — Telephone Encounter (Signed)
Faxed recent OV notes and ins info to [employer] [facility] [place].

## 2019-08-19 ENCOUNTER — Inpatient Hospital Stay (HOSPITAL_COMMUNITY)
Admission: EM | Admit: 2019-08-19 | Discharge: 2019-08-23 | DRG: 641 | Disposition: A | Discharge status: Home / Self Care | Payer: Medicare Other | Attending: Student | Admitting: Student

## 2019-08-19 ENCOUNTER — Emergency Department (HOSPITAL_COMMUNITY): Payer: Medicare Other

## 2019-08-19 ENCOUNTER — Encounter (HOSPITAL_COMMUNITY): Payer: Self-pay | Admitting: *Deleted

## 2019-08-19 ENCOUNTER — Telehealth: Payer: Self-pay | Admitting: Family Medicine

## 2019-08-19 ENCOUNTER — Other Ambulatory Visit: Payer: Self-pay

## 2019-08-19 DIAGNOSIS — E875 Hyperkalemia: Secondary | ICD-10-CM | POA: Diagnosis present

## 2019-08-19 DIAGNOSIS — I5022 Chronic systolic (congestive) heart failure: Secondary | ICD-10-CM | POA: Diagnosis not present

## 2019-08-19 DIAGNOSIS — E860 Dehydration: Principal | ICD-10-CM | POA: Diagnosis present

## 2019-08-19 DIAGNOSIS — Z800 Family history of malignant neoplasm of digestive organs: Secondary | ICD-10-CM

## 2019-08-19 DIAGNOSIS — I714 Abdominal aortic aneurysm, without rupture, unspecified: Secondary | ICD-10-CM

## 2019-08-19 DIAGNOSIS — N136 Pyonephrosis: Secondary | ICD-10-CM | POA: Diagnosis present

## 2019-08-19 DIAGNOSIS — E861 Hypovolemia: Secondary | ICD-10-CM | POA: Diagnosis present

## 2019-08-19 DIAGNOSIS — R0602 Shortness of breath: Secondary | ICD-10-CM

## 2019-08-19 DIAGNOSIS — K7460 Unspecified cirrhosis of liver: Secondary | ICD-10-CM | POA: Diagnosis present

## 2019-08-19 DIAGNOSIS — Z20822 Contact with and (suspected) exposure to covid-19: Secondary | ICD-10-CM | POA: Diagnosis not present

## 2019-08-19 DIAGNOSIS — I509 Heart failure, unspecified: Secondary | ICD-10-CM

## 2019-08-19 DIAGNOSIS — Z8249 Family history of ischemic heart disease and other diseases of the circulatory system: Secondary | ICD-10-CM

## 2019-08-19 DIAGNOSIS — N1831 Chronic kidney disease, stage 3a: Secondary | ICD-10-CM | POA: Diagnosis present

## 2019-08-19 DIAGNOSIS — K5730 Diverticulosis of large intestine without perforation or abscess without bleeding: Secondary | ICD-10-CM | POA: Diagnosis not present

## 2019-08-19 DIAGNOSIS — Z89611 Acquired absence of right leg above knee: Secondary | ICD-10-CM

## 2019-08-19 DIAGNOSIS — Z823 Family history of stroke: Secondary | ICD-10-CM

## 2019-08-19 DIAGNOSIS — E440 Moderate protein-calorie malnutrition: Secondary | ICD-10-CM | POA: Diagnosis not present

## 2019-08-19 DIAGNOSIS — N179 Acute kidney failure, unspecified: Secondary | ICD-10-CM | POA: Diagnosis not present

## 2019-08-19 DIAGNOSIS — Z79899 Other long term (current) drug therapy: Secondary | ICD-10-CM

## 2019-08-19 DIAGNOSIS — E876 Hypokalemia: Secondary | ICD-10-CM | POA: Diagnosis not present

## 2019-08-19 DIAGNOSIS — Z7982 Long term (current) use of aspirin: Secondary | ICD-10-CM

## 2019-08-19 DIAGNOSIS — I255 Ischemic cardiomyopathy: Secondary | ICD-10-CM | POA: Diagnosis present

## 2019-08-19 DIAGNOSIS — I130 Hypertensive heart and chronic kidney disease with heart failure and stage 1 through stage 4 chronic kidney disease, or unspecified chronic kidney disease: Secondary | ICD-10-CM | POA: Diagnosis not present

## 2019-08-19 DIAGNOSIS — Z955 Presence of coronary angioplasty implant and graft: Secondary | ICD-10-CM

## 2019-08-19 DIAGNOSIS — Z681 Body mass index (BMI) 19 or less, adult: Secondary | ICD-10-CM

## 2019-08-19 DIAGNOSIS — R296 Repeated falls: Secondary | ICD-10-CM | POA: Diagnosis present

## 2019-08-19 DIAGNOSIS — Z87891 Personal history of nicotine dependence: Secondary | ICD-10-CM

## 2019-08-19 DIAGNOSIS — E872 Acidosis: Secondary | ICD-10-CM | POA: Diagnosis present

## 2019-08-19 DIAGNOSIS — I252 Old myocardial infarction: Secondary | ICD-10-CM

## 2019-08-19 DIAGNOSIS — Z87442 Personal history of urinary calculi: Secondary | ICD-10-CM

## 2019-08-19 DIAGNOSIS — D638 Anemia in other chronic diseases classified elsewhere: Secondary | ICD-10-CM | POA: Diagnosis present

## 2019-08-19 DIAGNOSIS — I2510 Atherosclerotic heart disease of native coronary artery without angina pectoris: Secondary | ICD-10-CM | POA: Diagnosis present

## 2019-08-19 LAB — CBC
HCT: 28.3 % — ABNORMAL LOW (ref 39.0–52.0)
HCT: 28 % — ABNORMAL LOW (ref 39.0–52.0)
Hemoglobin: 9.1 g/dL — ABNORMAL LOW (ref 13.0–17.0)
Hemoglobin: 8.9 g/dL — ABNORMAL LOW (ref 13.0–17.0)
MCH: 33.6 pg (ref 26.0–34.0)
MCH: 32.5 pg (ref 26.0–34.0)
MCHC: 32.2 g/dL (ref 30.0–36.0)
MCHC: 31.8 g/dL (ref 30.0–36.0)
MCV: 104.4 fL — ABNORMAL HIGH (ref 80.0–100.0)
MCV: 102.2 fL — ABNORMAL HIGH (ref 80.0–100.0)
Platelets: 321 10*3/uL (ref 150–400)
Platelets: 309 10*3/uL (ref 150–400)
RBC: 2.71 MIL/uL — ABNORMAL LOW (ref 4.22–5.81)
RBC: 2.74 MIL/uL — ABNORMAL LOW (ref 4.22–5.81)
RDW: 14.3 % (ref 11.5–15.5)
RDW: 14.3 % (ref 11.5–15.5)
WBC: 10.7 10*3/uL — ABNORMAL HIGH (ref 4.0–10.5)
WBC: 9.7 10*3/uL (ref 4.0–10.5)
nRBC: 0 % (ref 0.0–0.2)
nRBC: 0 % (ref 0.0–0.2)

## 2019-08-19 LAB — URINALYSIS, ROUTINE W REFLEX MICROSCOPIC
Bilirubin Urine: NEGATIVE
Glucose, UA: NEGATIVE mg/dL
Hgb urine dipstick: NEGATIVE
Ketones, ur: NEGATIVE mg/dL
Nitrite: NEGATIVE
Protein, ur: 30 mg/dL — AB
Specific Gravity, Urine: 1.015 (ref 1.005–1.030)
pH: 5 (ref 5.0–8.0)

## 2019-08-19 LAB — DIFFERENTIAL
Abs Immature Granulocytes: 0.09 10*3/uL — ABNORMAL HIGH (ref 0.00–0.07)
Basophils Absolute: 0 10*3/uL (ref 0.0–0.1)
Basophils Relative: 0 %
Eosinophils Absolute: 0.2 10*3/uL (ref 0.0–0.5)
Eosinophils Relative: 2 %
Immature Granulocytes: 1 %
Lymphocytes Relative: 4 %
Lymphs Abs: 0.4 10*3/uL — ABNORMAL LOW (ref 0.7–4.0)
Monocytes Absolute: 0.4 10*3/uL (ref 0.1–1.0)
Monocytes Relative: 4 %
Neutro Abs: 8.6 10*3/uL — ABNORMAL HIGH (ref 1.7–7.7)
Neutrophils Relative %: 89 %

## 2019-08-19 LAB — HEPATIC FUNCTION PANEL
ALT: 80 U/L — ABNORMAL HIGH (ref 0–44)
AST: 64 U/L — ABNORMAL HIGH (ref 15–41)
Albumin: 2.1 g/dL — ABNORMAL LOW (ref 3.5–5.0)
Alkaline Phosphatase: 98 U/L (ref 38–126)
Bilirubin, Direct: 0.2 mg/dL (ref 0.0–0.2)
Indirect Bilirubin: 0.5 mg/dL (ref 0.3–0.9)
Total Bilirubin: 0.7 mg/dL (ref 0.3–1.2)
Total Protein: 6.1 g/dL — ABNORMAL LOW (ref 6.5–8.1)

## 2019-08-19 LAB — BASIC METABOLIC PANEL
Anion gap: 9 (ref 5–15)
BUN: 75 mg/dL — ABNORMAL HIGH (ref 8–23)
CO2: 16 mmol/L — ABNORMAL LOW (ref 22–32)
Calcium: 8.4 mg/dL — ABNORMAL LOW (ref 8.9–10.3)
Chloride: 110 mmol/L (ref 98–111)
Creatinine, Ser: 3.51 mg/dL — ABNORMAL HIGH (ref 0.61–1.24)
GFR calc Af Amer: 18 mL/min — ABNORMAL LOW (ref >60)
GFR calc non Af Amer: 16 mL/min — ABNORMAL LOW (ref >60)
Glucose, Bld: 157 mg/dL — ABNORMAL HIGH (ref 70–99)
Potassium: 5.9 mmol/L — ABNORMAL HIGH (ref 3.5–5.1)
Sodium: 135 mmol/L (ref 135–145)

## 2019-08-19 MED ORDER — SODIUM CHLORIDE 0.9% FLUSH
3.0000 mL | Freq: Once | INTRAVENOUS | Status: AC
  Administered 2019-08-19: 3 mL via INTRAVENOUS

## 2019-08-19 MED ORDER — SODIUM CHLORIDE 0.9 % IV BOLUS
1000.0000 mL | Freq: Once | INTRAVENOUS | Status: AC
  Administered 2019-08-19: 1000 mL via INTRAVENOUS

## 2019-08-19 NOTE — Telephone Encounter (Signed)
Stephen [name] with [employer] called stating after reviewing the notes from Dr. Danise Mina he does not think that they can get medicare to pay for the wheelchair. Stephen [name] stated that there are several items that need to be added to Dr, Gutierrez's notes in order to get Medicare to pay for the wheelchair. Stephen [name] will fax over the additional notes that need to be added. Stephen [name] said the original note can be addended or a new note can be done.
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
## 2019-08-19 NOTE — ED Notes (Signed)
Pt. Transferred to CT  

## 2019-08-19 NOTE — Telephone Encounter (Signed)
Paperwork is in your in basket for review to make the changes to the orders for wheelchair.

## 2019-08-19 NOTE — ED Triage Notes (Signed)
Pt sent by his provider for eval of "anemia, transaminitis, malnutrition" per notes. Pt says for about 2 weeks he has just not felt well, not eating much, and has had 5-6 falls since the end of February.

## 2019-08-19 NOTE — Telephone Encounter (Signed)
Patient wife calling again regarding lab results I advised that we will call as soon as these are reviewed.   **Dr Darnell Level, I am on call this weekend and if you are unable to reach the patient today with results you can send these directly to me and I will call the patient.   Also, please see "Community messages" in your box about the Transfer Chair -- Medicare needs OV notes addended.

## 2019-08-19 NOTE — Telephone Encounter (Signed)
New office visit note printed and placed in Lisa's box.

## 2019-08-19 NOTE — Telephone Encounter (Signed)
Spouse (karen) called stating [employer] told her this morning they have not received any information Please refax  Best number for karen   [phone]

## 2019-08-19 NOTE — Telephone Encounter (Signed)
Information re-faxed to [facility] # [phone]   Wife aware this is being re-faxed. She states that she talked to them again and they actually did receive this so the fax is no longer needed.   She will let us know if anything further from our office.   Nothing further needed.

## 2019-08-19 NOTE — Telephone Encounter (Signed)
Spoke with patient and wife. Acute worsening kidney function as well as worse anemia, transaminitis, malnutrition. I recommended he go tonight to ER for further evaluation. I spoke with charge nurse at [facility] ER to notify pt is on his way.

## 2019-08-19 NOTE — Telephone Encounter (Signed)
Spouse (karen) called wanting someone to call her regarding pt lab work he had done this week.

## 2019-08-20 ENCOUNTER — Inpatient Hospital Stay (HOSPITAL_COMMUNITY): Payer: Medicare Other

## 2019-08-20 DIAGNOSIS — Z89611 Acquired absence of right leg above knee: Secondary | ICD-10-CM

## 2019-08-20 DIAGNOSIS — I714 Abdominal aortic aneurysm, without rupture: Secondary | ICD-10-CM | POA: Diagnosis present

## 2019-08-20 DIAGNOSIS — I252 Old myocardial infarction: Secondary | ICD-10-CM | POA: Diagnosis not present

## 2019-08-20 DIAGNOSIS — N136 Pyonephrosis: Secondary | ICD-10-CM | POA: Diagnosis present

## 2019-08-20 DIAGNOSIS — I255 Ischemic cardiomyopathy: Secondary | ICD-10-CM | POA: Diagnosis present

## 2019-08-20 DIAGNOSIS — E440 Moderate protein-calorie malnutrition: Secondary | ICD-10-CM

## 2019-08-20 DIAGNOSIS — Z8249 Family history of ischemic heart disease and other diseases of the circulatory system: Secondary | ICD-10-CM | POA: Diagnosis not present

## 2019-08-20 DIAGNOSIS — E875 Hyperkalemia: Secondary | ICD-10-CM | POA: Diagnosis present

## 2019-08-20 DIAGNOSIS — E872 Acidosis: Secondary | ICD-10-CM

## 2019-08-20 DIAGNOSIS — E860 Dehydration: Secondary | ICD-10-CM | POA: Diagnosis present

## 2019-08-20 DIAGNOSIS — Z20822 Contact with and (suspected) exposure to covid-19: Secondary | ICD-10-CM | POA: Diagnosis present

## 2019-08-20 DIAGNOSIS — R0602 Shortness of breath: Secondary | ICD-10-CM | POA: Diagnosis not present

## 2019-08-20 DIAGNOSIS — K7460 Unspecified cirrhosis of liver: Secondary | ICD-10-CM | POA: Diagnosis present

## 2019-08-20 DIAGNOSIS — I130 Hypertensive heart and chronic kidney disease with heart failure and stage 1 through stage 4 chronic kidney disease, or unspecified chronic kidney disease: Secondary | ICD-10-CM | POA: Diagnosis present

## 2019-08-20 DIAGNOSIS — D649 Anemia, unspecified: Secondary | ICD-10-CM | POA: Diagnosis not present

## 2019-08-20 DIAGNOSIS — E876 Hypokalemia: Secondary | ICD-10-CM | POA: Diagnosis not present

## 2019-08-20 DIAGNOSIS — Z955 Presence of coronary angioplasty implant and graft: Secondary | ICD-10-CM | POA: Diagnosis not present

## 2019-08-20 DIAGNOSIS — Z87891 Personal history of nicotine dependence: Secondary | ICD-10-CM | POA: Diagnosis not present

## 2019-08-20 DIAGNOSIS — R296 Repeated falls: Secondary | ICD-10-CM | POA: Diagnosis present

## 2019-08-20 DIAGNOSIS — I5022 Chronic systolic (congestive) heart failure: Secondary | ICD-10-CM | POA: Diagnosis present

## 2019-08-20 DIAGNOSIS — N1831 Chronic kidney disease, stage 3a: Secondary | ICD-10-CM | POA: Diagnosis present

## 2019-08-20 DIAGNOSIS — N10 Acute tubulo-interstitial nephritis: Secondary | ICD-10-CM | POA: Diagnosis not present

## 2019-08-20 DIAGNOSIS — N179 Acute kidney failure, unspecified: Secondary | ICD-10-CM | POA: Diagnosis present

## 2019-08-20 DIAGNOSIS — I10 Essential (primary) hypertension: Secondary | ICD-10-CM | POA: Diagnosis not present

## 2019-08-20 DIAGNOSIS — E861 Hypovolemia: Secondary | ICD-10-CM | POA: Diagnosis present

## 2019-08-20 DIAGNOSIS — R5381 Other malaise: Secondary | ICD-10-CM

## 2019-08-20 DIAGNOSIS — Z681 Body mass index (BMI) 19 or less, adult: Secondary | ICD-10-CM | POA: Diagnosis not present

## 2019-08-20 DIAGNOSIS — D631 Anemia in chronic kidney disease: Secondary | ICD-10-CM | POA: Diagnosis not present

## 2019-08-20 DIAGNOSIS — R7989 Other specified abnormal findings of blood chemistry: Secondary | ICD-10-CM | POA: Diagnosis not present

## 2019-08-20 DIAGNOSIS — N189 Chronic kidney disease, unspecified: Secondary | ICD-10-CM | POA: Diagnosis not present

## 2019-08-20 DIAGNOSIS — I2510 Atherosclerotic heart disease of native coronary artery without angina pectoris: Secondary | ICD-10-CM | POA: Diagnosis present

## 2019-08-20 DIAGNOSIS — I509 Heart failure, unspecified: Secondary | ICD-10-CM | POA: Diagnosis not present

## 2019-08-20 DIAGNOSIS — D638 Anemia in other chronic diseases classified elsewhere: Secondary | ICD-10-CM | POA: Diagnosis present

## 2019-08-20 LAB — BASIC METABOLIC PANEL
Anion gap: 8 (ref 5–15)
Anion gap: 10 (ref 5–15)
Anion gap: 11 (ref 5–15)
BUN: 72 mg/dL — ABNORMAL HIGH (ref 8–23)
BUN: 75 mg/dL — ABNORMAL HIGH (ref 8–23)
BUN: 72 mg/dL — ABNORMAL HIGH (ref 8–23)
CO2: 14 mmol/L — ABNORMAL LOW (ref 22–32)
CO2: 13 mmol/L — ABNORMAL LOW (ref 22–32)
CO2: 17 mmol/L — ABNORMAL LOW (ref 22–32)
Calcium: 7.8 mg/dL — ABNORMAL LOW (ref 8.9–10.3)
Calcium: 8.2 mg/dL — ABNORMAL LOW (ref 8.9–10.3)
Calcium: 8.4 mg/dL — ABNORMAL LOW (ref 8.9–10.3)
Chloride: 116 mmol/L — ABNORMAL HIGH (ref 98–111)
Chloride: 112 mmol/L — ABNORMAL HIGH (ref 98–111)
Chloride: 116 mmol/L — ABNORMAL HIGH (ref 98–111)
Creatinine, Ser: 3.32 mg/dL — ABNORMAL HIGH (ref 0.61–1.24)
Creatinine, Ser: 3.36 mg/dL — ABNORMAL HIGH (ref 0.61–1.24)
Creatinine, Ser: 3.43 mg/dL — ABNORMAL HIGH (ref 0.61–1.24)
GFR calc Af Amer: 19 mL/min — ABNORMAL LOW (ref >60)
GFR calc Af Amer: 19 mL/min — ABNORMAL LOW (ref >60)
GFR calc Af Amer: 19 mL/min — ABNORMAL LOW (ref >60)
GFR calc non Af Amer: 17 mL/min — ABNORMAL LOW (ref >60)
GFR calc non Af Amer: 16 mL/min — ABNORMAL LOW (ref >60)
GFR calc non Af Amer: 16 mL/min — ABNORMAL LOW (ref >60)
Glucose, Bld: 115 mg/dL — ABNORMAL HIGH (ref 70–99)
Glucose, Bld: 115 mg/dL — ABNORMAL HIGH (ref 70–99)
Glucose, Bld: 136 mg/dL — ABNORMAL HIGH (ref 70–99)
Potassium: 6.1 mmol/L — ABNORMAL HIGH (ref 3.5–5.1)
Potassium: 6.1 mmol/L — ABNORMAL HIGH (ref 3.5–5.1)
Potassium: 4.8 mmol/L (ref 3.5–5.1)
Sodium: 138 mmol/L (ref 135–145)
Sodium: 135 mmol/L (ref 135–145)
Sodium: 144 mmol/L (ref 135–145)

## 2019-08-20 LAB — SODIUM, URINE, RANDOM: Sodium, Ur: 62 mmol/L

## 2019-08-20 LAB — C DIFFICILE QUICK SCREEN W PCR REFLEX
C Diff antigen: NEGATIVE
C Diff interpretation: NOT DETECTED
C Diff toxin: NEGATIVE

## 2019-08-20 LAB — PROCALCITONIN: Procalcitonin: 0.17 ng/mL

## 2019-08-20 LAB — TSH: TSH: 0.874 u[IU]/mL (ref 0.350–4.500)

## 2019-08-20 LAB — BRAIN NATRIURETIC PEPTIDE: B Natriuretic Peptide: 271.4 pg/mL — ABNORMAL HIGH (ref 0.0–100.0)

## 2019-08-20 LAB — SARS CORONAVIRUS 2 (TAT 6-24 HRS): SARS Coronavirus 2: NEGATIVE

## 2019-08-20 LAB — CREATININE, URINE, RANDOM: Creatinine, Urine: 112.83 mg/dL

## 2019-08-20 LAB — C-REACTIVE PROTEIN: CRP: 13.4 mg/dL — ABNORMAL HIGH (ref <1.0)

## 2019-08-20 MED ORDER — ADULT MULTIVITAMIN W/MINERALS CH
1.0000 | ORAL_TABLET | Freq: Every day | ORAL | Status: DC
  Administered 2019-08-20 – 2019-08-23 (×4): 1 via ORAL
  Filled 2019-08-20 (×3): qty 1

## 2019-08-20 MED ORDER — INSULIN ASPART 100 UNIT/ML ~~LOC~~ SOLN
10.0000 [IU] | Freq: Once | SUBCUTANEOUS | Status: AC
  Administered 2019-08-20: 10 [IU] via SUBCUTANEOUS

## 2019-08-20 MED ORDER — SODIUM BICARBONATE 650 MG PO TABS
650.0000 mg | ORAL_TABLET | Freq: Three times a day (TID) | ORAL | Status: DC
  Administered 2019-08-20 – 2019-08-23 (×10): 650 mg via ORAL
  Filled 2019-08-20 (×9): qty 1

## 2019-08-20 MED ORDER — ENSURE ENLIVE PO LIQD
237.0000 mL | Freq: Three times a day (TID) | ORAL | Status: DC
  Administered 2019-08-20 – 2019-08-23 (×8): 237 mL via ORAL

## 2019-08-20 MED ORDER — SODIUM CHLORIDE 0.9 % IV SOLN: INTRAVENOUS | Status: DC

## 2019-08-20 MED ORDER — SODIUM BICARBONATE 8.4 % IV SOLN
25.0000 meq | Freq: Once | INTRAVENOUS | Status: AC
  Administered 2019-08-20: 25 meq via INTRAVENOUS
  Filled 2019-08-20: qty 50

## 2019-08-20 MED ORDER — SODIUM POLYSTYRENE SULFONATE 15 GM/60ML PO SUSP
45.0000 g | Freq: Once | ORAL | Status: AC
  Administered 2019-08-20: 45 g via ORAL
  Filled 2019-08-20: qty 180

## 2019-08-20 MED ORDER — ASPIRIN EC 81 MG PO TBEC
81.0000 mg | DELAYED_RELEASE_TABLET | Freq: Every day | ORAL | Status: DC
  Administered 2019-08-20 – 2019-08-22 (×3): 81 mg via ORAL
  Filled 2019-08-20 (×3): qty 1

## 2019-08-20 MED ORDER — ATORVASTATIN CALCIUM 40 MG PO TABS
40.0000 mg | ORAL_TABLET | Freq: Every day | ORAL | Status: DC
  Administered 2019-08-20 – 2019-08-22 (×3): 40 mg via ORAL
  Filled 2019-08-20 (×3): qty 1

## 2019-08-20 MED ORDER — ENSURE ENLIVE PO LIQD
237.0000 mL | Freq: Two times a day (BID) | ORAL | Status: DC
  Administered 2019-08-20: 237 mL via ORAL

## 2019-08-20 MED ORDER — DEXTROSE 50 % IV SOLN
1.0000 | Freq: Once | INTRAVENOUS | Status: AC
  Administered 2019-08-20: 50 mL via INTRAVENOUS
  Filled 2019-08-20: qty 50

## 2019-08-20 MED ORDER — CALCIUM GLUCONATE-NACL 1-0.675 GM/50ML-% IV SOLN
1.0000 g | Freq: Once | INTRAVENOUS | Status: AC
  Administered 2019-08-20: 1000 mg via INTRAVENOUS
  Filled 2019-08-20: qty 50

## 2019-08-20 MED ORDER — ONDANSETRON HCL 4 MG/2ML IJ SOLN: 4.0000 mg | Freq: Four times a day (QID) | INTRAMUSCULAR | Status: DC | PRN

## 2019-08-20 MED ORDER — EZETIMIBE 10 MG PO TABS
10.0000 mg | ORAL_TABLET | Freq: Every day | ORAL | Status: DC
  Administered 2019-08-20: 10 mg via ORAL
  Filled 2019-08-20: qty 1

## 2019-08-20 MED ORDER — SODIUM CHLORIDE 0.9 % IV SOLN
1.0000 g | Freq: Every day | INTRAVENOUS | Status: DC
  Administered 2019-08-20 – 2019-08-21 (×2): 1 g via INTRAVENOUS
  Filled 2019-08-20 (×3): qty 10
  Filled 2019-08-20: qty 1

## 2019-08-20 MED ORDER — CARVEDILOL 12.5 MG PO TABS
12.5000 mg | ORAL_TABLET | Freq: Two times a day (BID) | ORAL | Status: DC
  Administered 2019-08-20 – 2019-08-21 (×3): 12.5 mg via ORAL
  Filled 2019-08-20 (×3): qty 1

## 2019-08-20 MED ORDER — ONDANSETRON HCL 4 MG PO TABS
4.0000 mg | ORAL_TABLET | Freq: Four times a day (QID) | ORAL | Status: DC | PRN
  Filled 2019-08-20: qty 1

## 2019-08-20 NOTE — ED Notes (Signed)
RN to get 2nd set of blood cultures and labs per Otila Kluver

## 2019-08-20 NOTE — Progress Notes (Signed)
Initial Nutrition Assessment  **RD working remotely**  DOCUMENTATION CODES:   Not applicable  INTERVENTION:   Ensure Enlive po TID, each supplement provides 350 kcal and 20 grams of protein  MVI daily   NUTRITION DIAGNOSIS:   Inadequate oral intake related to poor appetite as evidenced by other (comment)(per RN report).   GOAL:   Patient will meet greater than or equal to 90% of their needs    MONITOR:   PO intake, Supplement acceptance, Weight trends, Labs  REASON FOR ASSESSMENT:   Consult Assessment of nutrition requirement/status  ASSESSMENT:   Pt with a PMH significant for CAD/stents, ICM, liver cirrhosis due to Hep C, portal hypertensive gastrophy, CKD stage 3, AAA with endoleak s/p repair, and HTN admitted with AKI  RD unable to reach pt via phone.   No PO intake documented. RN reports pt did not consume lunch; unsure of Ensure consumption.   Pt noted to have a 6.7% wt loss x3 months, which is not significant for time frame.   Medications reviewed and include: Ensure Enlive BID, Sodium Bicarbonate  Labs reviewed: K+ 6.1 (H), BUN/Cr 75/3.36 (H)  NUTRITION - FOCUSED PHYSICAL EXAM:  RD unable to perform at this time.    Diet Order:   Diet Order            Diet Heart Room service appropriate? Yes; Fluid consistency: Thin  Diet effective now              EDUCATION NEEDS:   No education needs have been identified at this time  Skin:  Skin Assessment: Reviewed RN Assessment  Last BM:  unknown  Height:   Ht Readings from Last 1 Encounters:  08/19/19 5\' 8"  (1.727 m)    Weight:   Wt Readings from Last 1 Encounters:  08/20/19 55.3 kg    BMI:  Body mass index is 18.54 kg/m.  Estimated Nutritional Needs:   Kcal:  1600-1800  Protein:  65-80 grams  Fluid:  >/=1.6 L   Larkin Ina, MS, RD, LDN RD pager number and weekend/on-call pager number located in [place].
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
## 2019-08-20 NOTE — Progress Notes (Signed)
Pt had 20cc of urine out did a post bladder scan and found 35cc left in the bladder

## 2019-08-20 NOTE — Telephone Encounter (Signed)
I will send a community message back to the [employer] team  - they can see notes in Epic so we may not have to fax anything over. They will message back if they need faxes of anything.
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
## 2019-08-20 NOTE — Care Management (Signed)
Patient active with [facility] PTA. TOC will continue to follow for DC plan and needs.

## 2019-08-20 NOTE — Progress Notes (Addendum)
PROGRESS NOTE  Stephen [name] VZD:[id number] DOB: 10/11/1940   PCP: [name], MD  Patient is from: Home.  DOA: 08/19/2019 LOS: 0  Brief Narrative / Interim history: 79 year old male with history of CAD/stents, ICM (30 to 35%), liver cirrhosis due to hep C, portal hypertensive gastropathy, CKD-3A, AAA with endoleak s/p repair in 4/19 and 5/19 and HTN who was directed to come to ED by PCP for AKI.  Heart weakness, fatigue and recurrent falls for few weeks that prompted him to go to PCP.  In ED, HDS.  Hgb 9.1 (baseline). Cr 3.5 (b/l 1.5).  BUN 75.  K5.9.  Bicarb 16.  UA with moderate LE and rare bacteria.  COVID-19 negative.  CT abdomen and pelvis revealed significant interval enlargement of infrarenal AAA and right iliac artery aneurysm with possible hemorrhagic product or thrombus within aneurysm sac, interval enlargement of left parapelvic cyst and possible mild left hydronephrosis without hydroureter or obvious stone and left greater than right perinephric fat stranding and fluid.  Vascular surgery consulted.  Patient was started on IV fluid and IV ceftriaxone and admitted for AKI on CKD-3a and possible pyelonephritis.   Subjective: Seen and examined earlier this morning.  No major events overnight or this morning.  He says he had multiple episodes of diarrhea earlier this morning that have stopped now.  Denies chest pain, shortness of breath, nausea, vomiting, abdominal pain, UTI symptoms or focal neuro symptoms.  He was seen by vascular surgery earlier this morning.  Objective: Vitals:   08/20/19 0500 08/20/19 0530 08/20/19 0545 08/20/19 0612  BP: (!) 123/59 (!) 124/57  (!) 120/59  Pulse: 62 71 69 68  Resp: (!) 22 17 20 14   Temp:    97.7 F (36.5 C)  TempSrc:    Oral  SpO2: 98% 94% 95% 100%  Weight:    55.3 kg  Height:        Intake/Output Summary (Last 24 hours) at 08/20/2019 1245 Last data filed at 08/20/2019 0107 Gross per 24 hour  Intake 1000 ml  Output --  Net  1000 ml   Filed Weights   08/19/19 1915 08/20/19 0612  Weight: 54.4 kg 55.3 kg    Examination:  GENERAL: No acute distress.  Appears well.  HEENT: MMM.  Vision and hearing grossly intact.  NECK: Supple.  No apparent JVD.  RESP:  No IWOB. Good air movement bilaterally. CVS:  RRR. Heart sounds normal.  ABD/GI/GU: Bowel sounds present. Soft. Non tender.  MSK/EXT:  Moves extremities. No apparent deformity. No edema.  SKIN: no apparent skin lesion or wound NEURO: Awake, alert and oriented appropriately.  No apparent focal neuro deficit. PSYCH: Calm. Normal affect.   Procedures:  None  Assessment & Plan: AKI on CKD-3A: Baseline Cr ~1.5 > 3.5 (on 3/16) > 3.51 (admit) >> 3.43.  BUN in 70s.  Suspect prerenal with azotemia.  Not sure if AAA would play a role in this.  There is also left-sided mild hydronephrosis without hydroureter or renal stone.  He is also on low-dose ramipril and Aldactone.  Denies NSAID use.  Overall, renal function seems to be plateauing. -Increase IV NS from 50 to 75 cc an hour -Urine chemistry -Restraints I's and O's -Avoid nephrotoxic meds. -Continue monitoring -Nephrology consultation if no improvement  AAA with endoleak status post repairs x2-CT abdomen and pelvis concerning for significant extension (about 1 cm enlargement over the last 1 year). -Vascular surgery following. -Optimal BP control-continue Coreg and titrate as able -Continue aspirin and  statin  Systolic CHF/ICM: Echo in 2019 with EF of 30 to 35%, indeterminate diastolic function, mild to moderate mitral regurg.  No cardiopulmonary symptoms.  BNP 271.  CXR without acute finding.  Appears hypovolemic.  Not on diuretics other than low-dose Aldactone at home. -Update echocardiogram -Hold Aldactone in the setting of AKI and hyperkalemia -Continue gentle IV fluid -Monitor fluid status, renal function and electrolytes  CAD s/p DES to LAD in 2013 stents: No anginal symptoms. -Continue Coreg, statin  and Zetia  Non-anion gap metabolic acidosis: Likely due to AKI.  Improved. -Continue monitoring  Hyperkalemia: Resolved. -Continue monitoring  Pyelonephritis?  UA and clinical picture not convincing -Continue ceftriaxone pending urine culture  Anemia of renal disease: Baseline Hgb~11> 9.2 (3/16)> 9.1 (admit)> 8.9.  Anemia panel consistent with anemia of chronic disease. -Continue monitoring  Diarrhea: Reportedly had watery bowel movement earlier this morning but resolved. -Continue monitoring.   History of hepatitis C with cirrhosis: Treated? -Outpatient follow-up  Moderate malnutrition:Body mass index is 18.54 kg/m. Wt Readings from Last 10 Encounters:  08/20/19 55.3 kg  08/16/19 54.6 kg  05/13/19 59.4 kg  05/04/19 59 kg  11/04/18 59.4 kg  06/14/18 60.3 kg  05/03/18 61 kg  04/26/18 60.7 kg  04/23/18 60.4 kg  12/23/17 60.1 kg  -Consult dietitian  Debility/recurrent fall/history of right AKA -PT/OT                 DVT prophylaxis: SCD Code Status: Full code Family Communication: Patient and/or RN. Available if any question.   Discharge barrier: Acute kidney injury requiring IV fluid, possible pyelonephritis, evaluation and treatment of enlarging AAA Patient is from: Home Final disposition: To be determined  Consultants: Vascular surgery   Microbiology summarized: COVID-19 negative Blood cultures pending Urine cultures pending  Sch Meds:  Scheduled Meds: . aspirin EC  81 mg Oral QHS  . atorvastatin  40 mg Oral q1800  . carvedilol  12.5 mg Oral BID WC  . ezetimibe  10 mg Oral Daily  . feeding supplement (ENSURE ENLIVE)  237 mL Oral BID BM  . sodium bicarbonate  650 mg Oral TID   Continuous Infusions: . sodium chloride 50 mL/hr at 08/20/19 0615  . cefTRIAXone (ROCEPHIN)  IV Stopped (08/20/19 0534)   PRN Meds:.ondansetron **OR** ondansetron (ZOFRAN) IV  Antimicrobials: Anti-infectives (From admission, onward)   Start     Dose/Rate Route  Frequency Ordered Stop   08/20/19 0315  cefTRIAXone (ROCEPHIN) 1 g in sodium chloride 0.9 % 100 mL IVPB    Note to Pharmacy: 1st dose now if not given in ed   1 g 200 mL/hr over 30 Minutes Intravenous Daily 08/20/19 0309         I have personally reviewed the following labs and images: CBC: Recent Labs  Lab 08/16/19 1215 08/19/19 1924 08/19/19 2144  WBC 9.9 10.7* 9.7  NEUTROABS 8.6*  --  8.6*  HGB 9.2* 9.1* 8.9*  HCT 26.6* 28.3* 28.0*  MCV 99.7 104.4* 102.2*  PLT 228.0 321 309   BMP &GFR Recent Labs  Lab 08/16/19 1215 08/19/19 1924 08/20/19 0210 08/20/19 0330 08/20/19 1117  NA 134* 135 138 135 144  K 4.9 5.9* 6.1* 6.1* 4.8  CL 107 110 116* 112* 116*  CO2 17* 16* 14* 13* 17*  GLUCOSE 128* 157* 115* 115* 136*  BUN 71* 75* 72* 75* 72*  CREATININE 3.50* 3.51* 3.32* 3.36* 3.43*  CALCIUM 8.2* 8.4* 7.8* 8.2* 8.4*   Estimated Creatinine Clearance: 13.7 mL/min (A) (by  C-G formula based on SCr of 3.43 mg/dL (H)). Liver & Pancreas: Recent Labs  Lab 08/16/19 1215 08/19/19 2144  AST 113* 64*  ALT 117* 80*  ALKPHOS 104 98  BILITOT 0.7 0.7  PROT 5.6* 6.1*  ALBUMIN 2.7* 2.1*   No results for input(s): LIPASE, AMYLASE in the last 168 hours. No results for input(s): AMMONIA in the last 168 hours. Diabetic: No results for input(s): HGBA1C in the last 72 hours. No results for input(s): GLUCAP in the last 168 hours. Cardiac Enzymes: No results for input(s): CKTOTAL, CKMB, CKMBINDEX, TROPONINI in the last 168 hours. No results for input(s): PROBNP in the last 8760 hours. Coagulation Profile: No results for input(s): INR, PROTIME in the last 168 hours. Thyroid Function Tests: Recent Labs    08/20/19 0149  TSH 0.874   Lipid Profile: No results for input(s): CHOL, HDL, LDLCALC, TRIG, CHOLHDL, LDLDIRECT in the last 72 hours. Anemia Panel: No results for input(s): VITAMINB12, FOLATE, FERRITIN, TIBC, IRON, RETICCTPCT in the last 72 hours. Urine analysis:    Component  Value Date/Time   COLORURINE YELLOW 08/19/2019 1922   APPEARANCEUR HAZY (A) 08/19/2019 1922   LABSPEC 1.015 08/19/2019 1922   PHURINE 5.0 08/19/2019 [place] NEGATIVE 08/19/2019 [place] NEGATIVE 08/19/2019 [place] NEGATIVE 08/19/2019 [place] NEGATIVE 08/19/2019 1922   PROTEINUR 30 (A) 08/19/2019 1922   UROBILINOGEN 1.0 09/26/2011 0850   NITRITE NEGATIVE 08/19/2019 1922   LEUKOCYTESUR MODERATE (A) 08/19/2019 1922   Sepsis Labs: Invalid input(s): PROCALCITONIN, [place]  Microbiology: Recent Results (from the past 240 hour(s))  SARS CORONAVIRUS 2 (TAT 6-24 HRS) Nasopharyngeal Nasopharyngeal Swab     Status: None   Collection Time: 08/19/19 11:13 PM   Specimen: Nasopharyngeal Swab  Result Value Ref Range Status   SARS Coronavirus 2 NEGATIVE NEGATIVE Final    Comment: (NOTE) SARS-CoV-2 target nucleic acids are NOT DETECTED. The SARS-CoV-2 RNA is generally detectable in upper and lower respiratory specimens during the acute phase of infection. Negative results do not preclude SARS-CoV-2 infection, do not rule out co-infections with other pathogens, and should not be used as the sole basis for treatment or other patient management decisions. Negative results must be combined with clinical observations, patient history, and epidemiological information. The expected result is Negative. Fact Sheet for Patients: SugarRoll.be Fact Sheet for Healthcare Providers: https://www.woods-mathews.com/ This test is not yet approved or cleared by the [place] FDA and  has been authorized for detection and/or diagnosis of SARS-CoV-2 by FDA under an Emergency Use Authorization (EUA). This EUA will remain  in effect (meaning this test can be used) for the duration of the COVID-19 declaration under Section 56 4(b)(1) of the Act, 21 U.S.C. section 360bbb-3(b)(1), unless the authorization is terminated or revoked  sooner. Performed at [facility], [place] [address], [place], [place] [address]     Radiology Studies: CT ABDOMEN PELVIS WO CONTRAST  Result Date: 08/19/2019 CLINICAL DATA:  Anemia malnutrition EXAM: CT ABDOMEN AND PELVIS WITHOUT CONTRAST TECHNIQUE: Multidetector CT imaging of the abdomen and pelvis was performed following the standard protocol without IV contrast. COMPARISON:  CT 06/10/2018, 12/07/2017, 10/12/2017 FINDINGS: Lower chest: Lung bases demonstrate emphysematous disease. No acute consolidation or pleural effusion. Coronary vascular calcification. Hepatobiliary: No focal liver abnormality is seen. No gallstones, gallbladder wall thickening, or biliary dilatation. Pancreas: Unremarkable. No pancreatic ductal dilatation or surrounding inflammatory changes. Spleen: Normal in size without focal abnormality. Adrenals/Urinary Tract: Adrenal glands are within normal  limits. Interval increase in size of left parapelvic cyst, now measuring 5.6 cm, compared with 4.5 cm previously. Suspect development of hydronephrosis. Renal pelvic calcifications are likely vascular. The left ureter does not appear dilated. No definitive ureteral stone. There is increased perinephric stranding and a small amount of left greater than right perinephric fluid. Mass effect on the left posterior aspect of the bladder from the prostate gland. Stomach/Bowel: Stomach nonenlarged. No dilated small bowel. No bowel wall thickening. Negative appendix. Extensive sigmoid colon diverticula without acute inflammatory process. Vascular/Lymphatic: Status post endovascular repair of infrarenal abdominal aortic aneurysm with graft extending to the left iliac artery. Interim enlargement of the aneurysm sac surrounding the graph, sample measurement of 7.3 cm series 3, image number 50, compared with 6.2 cm on prior exam, similar location. Right iliac artery aneurysm also appears enlarged measuring up to 5.8 cm as compared with 4.9 cm, series  3, image number 59. Hyperdense thrombus/blood product is evident within the aneurysm sac. No retroperitoneal hematoma is evident. No soft tissue stranding to suggest leakage at this time. No significantly enlarged lymph nodes. Reproductive: Enlarged prostate with mass effect on the bladder Other: Negative for free air or free fluid. Musculoskeletal: Degenerative changes. No acute or suspicious osseous abnormality. IMPRESSION: 1. Status post previous endovascular repair of infrarenal abdominal aortic aneurysm with extension of graft material to the left iliac artery. Significant interval enlargement of the residual aneurysm sac surrounding the graft, in addition to interval enlargement of right iliac artery aneurysm since the 06/10/2018 CT, with evidence of hyperdense hemorrhagic product or thrombus within the aneurysm sacs. Negative for retroperitoneal hematoma at this time. Vascular surgery consultation/follow-up is recommended. 2. Interval enlargement of previously noted left parapelvic cyst. Suspicion of development of mild left hydronephrosis though without hydroureter or obvious stone disease. Left greater than right perinephric fat stranding and fluid. 3. Extensive sigmoid colon diverticular disease without acute inflammatory process. Electronically Signed   By: Donavan Foil M.D.   On: 08/19/2019 22:28   DG Chest Port 1 View  Result Date: 08/20/2019 CLINICAL DATA:  Shortness of breath. EXAM: PORTABLE CHEST 1 VIEW COMPARISON:  September 11, 2017. FINDINGS: The heart size and mediastinal contours are within normal limits. Both lungs are clear. No pneumothorax or pleural effusion is noted. The visualized skeletal structures are unremarkable. IMPRESSION: No active disease. Electronically Signed   By: Marijo Conception M.D.   On: 08/20/2019 10:40   35 minutes with more than 50% spent in reviewing records, counseling patient/family and coordinating care.   Mendy Lapinsky T. [place]  If 7PM-7AM, please  contact night-coverage [email or web address] Password [facility] 08/20/2019, 12:45 PM

## 2019-08-21 ENCOUNTER — Inpatient Hospital Stay (HOSPITAL_COMMUNITY): Payer: Medicare Other

## 2019-08-21 DIAGNOSIS — R7989 Other specified abnormal findings of blood chemistry: Secondary | ICD-10-CM

## 2019-08-21 DIAGNOSIS — I255 Ischemic cardiomyopathy: Secondary | ICD-10-CM

## 2019-08-21 DIAGNOSIS — D649 Anemia, unspecified: Secondary | ICD-10-CM

## 2019-08-21 DIAGNOSIS — N189 Chronic kidney disease, unspecified: Secondary | ICD-10-CM

## 2019-08-21 LAB — CBC
HCT: 22.9 % — ABNORMAL LOW (ref 39.0–52.0)
Hemoglobin: 7.1 g/dL — ABNORMAL LOW (ref 13.0–17.0)
MCH: 32.9 pg (ref 26.0–34.0)
MCHC: 31 g/dL (ref 30.0–36.0)
MCV: 106 fL — ABNORMAL HIGH (ref 80.0–100.0)
Platelets: 258 10*3/uL (ref 150–400)
RBC: 2.16 MIL/uL — ABNORMAL LOW (ref 4.22–5.81)
RDW: 14.4 % (ref 11.5–15.5)
WBC: 6.9 10*3/uL (ref 4.0–10.5)
nRBC: 0 % (ref 0.0–0.2)

## 2019-08-21 LAB — ECHOCARDIOGRAM COMPLETE
Height: 68 in
Weight: 2084.67 oz

## 2019-08-21 LAB — RENAL FUNCTION PANEL
Albumin: 1.7 g/dL — ABNORMAL LOW (ref 3.5–5.0)
Anion gap: 11 (ref 5–15)
BUN: 63 mg/dL — ABNORMAL HIGH (ref 8–23)
CO2: 17 mmol/L — ABNORMAL LOW (ref 22–32)
Calcium: 7.8 mg/dL — ABNORMAL LOW (ref 8.9–10.3)
Chloride: 114 mmol/L — ABNORMAL HIGH (ref 98–111)
Creatinine, Ser: 2.91 mg/dL — ABNORMAL HIGH (ref 0.61–1.24)
GFR calc Af Amer: 23 mL/min — ABNORMAL LOW (ref >60)
GFR calc non Af Amer: 20 mL/min — ABNORMAL LOW (ref >60)
Glucose, Bld: 126 mg/dL — ABNORMAL HIGH (ref 70–99)
Phosphorus: 3.6 mg/dL (ref 2.5–4.6)
Potassium: 4.8 mmol/L (ref 3.5–5.1)
Sodium: 142 mmol/L (ref 135–145)

## 2019-08-21 LAB — URINE CULTURE: Culture: <10000 — AB

## 2019-08-21 LAB — HEMOGLOBIN AND HEMATOCRIT, BLOOD
HCT: 22.6 % — ABNORMAL LOW (ref 39.0–52.0)
Hemoglobin: 7.4 g/dL — ABNORMAL LOW (ref 13.0–17.0)

## 2019-08-21 LAB — OSMOLALITY, URINE: Osmolality, Ur: 553 mOsm/kg (ref 300–900)

## 2019-08-21 LAB — PROCALCITONIN: Procalcitonin: 0.12 ng/mL

## 2019-08-21 LAB — MAGNESIUM: Magnesium: 2.1 mg/dL (ref 1.7–2.4)

## 2019-08-21 MED ORDER — PERFLUTREN LIPID MICROSPHERE
1.0000 mL | INTRAVENOUS | Status: AC | PRN
  Administered 2019-08-21: 2 mL via INTRAVENOUS
  Filled 2019-08-21: qty 10

## 2019-08-21 MED ORDER — CARVEDILOL 6.25 MG PO TABS
18.7500 mg | ORAL_TABLET | Freq: Two times a day (BID) | ORAL | Status: DC
  Administered 2019-08-21 – 2019-08-23 (×4): 18.75 mg via ORAL
  Filled 2019-08-21 (×4): qty 1

## 2019-08-21 NOTE — Progress Notes (Signed)
*  PRELIMINARY RESULTS* Echocardiogram 2D Echocardiogram has been performed w\ Definity.  Samuel Germany 08/21/2019, 5:10 PM

## 2019-08-21 NOTE — Evaluation (Signed)
Physical Therapy Evaluation Patient Details Name: Stephen [name] MRN: [id number] DOB: 09/30/1940 Today's Date: 08/21/2019   History of Present Illness  Stephen [name] is a 79 y.o. male with medical history significant of CAD status post MI/stents,Ischemic cardiomyopathy with EF of around 30 to 35%, history of cirrhosis due to hepatitis C associated portal hypertensive gastropathy, CKD stage IIIa, abdominal aortic aneurysm with repair x2, hypertension.  Pt presents to  ED in referral from his PCP due to abnormal renal function noted on labs. CT of abdomen noted significant enlargement of abdominal aortic aneurysm and mild hydronephrosis.    Clinical Impression  Pt admitted with above. Pt with frequent falls, almost daily over the last several weeks in which pt has mad himself "bed ridden" as he doesn't have a w/c. Pt was indep, driving, playing golf up until 3-4 weeks ago. Pt now unable to amb safely due to L LE giving out causing him to fall. Pt denies sensation impairement. Aware pt has and enlarged AAA and is awaiting further review by vascular. Pt to con't to follow and will re-address d/c recommendations upon learning POC by the vascular team. Pt with good home set up and support.    Follow Up Recommendations Home health PT;Supervision/Assistance - 24 hour(will be re-evaluated after POC decided by vascular)    Equipment Recommendations  Wheelchair (measurements PT);Wheelchair cushion (measurements PT)(pending POC derived by vascular)    Recommendations for Other Services       Precautions / Restrictions Precautions Precautions: Fall Precaution Comments: freq falls, R AKA Restrictions Weight Bearing Restrictions: No      Mobility  Bed Mobility Overal bed mobility: Modified Independent             General bed mobility comments: HOB elevated, increased time, used bed rail  Transfers Overall transfer level: Needs assistance Equipment used: Rolling walker (2  wheeled) Transfers: Public house manager;Sit to/from Stand Sit to Stand: Min guard   Squat pivot transfers: Min guard     General transfer comment: pt completed squat pvt transfer from EOB to chair with min guard for safety, no physical assist required. min guard for safety during transition of hands from arm rests to RW, verbal cues to reach back for chair upon sitting  Ambulation/Gait             General Gait Details: pt deferred stating "I just fall all the time, my left leg just gives out" pt also doesn't have prosthesis here and doesn't feel comfortable with "hopping on L LE only"  Stairs            Wheelchair Mobility    Modified Rankin (Stroke Patients Only)       Balance Overall balance assessment: Needs assistance Sitting-balance support: Feet unsupported;No upper extremity supported Sitting balance-Leahy Scale: Good     Standing balance support: Bilateral upper extremity supported Standing balance-Leahy Scale: Poor Standing balance comment: without R LE prosthesis pt dependent on bilat UEs for standing in RW                             Pertinent Vitals/Pain Pain Assessment: No/denies pain    Home Living Family/patient expects to be discharged to:: Private residence Living Arrangements: Spouse/significant other Available Help at Discharge: Family;Available 24 hours/day(wife, son lives with them but works during the day) Type of Home: House Home Access: Stairs to enter Entrance Stairs-Rails: Right Entrance Stairs-Number of Steps: [place]: One level Home  Equipment: Gilford Rile - 2 wheels;Cane - single point;Shower seat Additional Comments: R LE prosthesis    Prior Function Level of Independence: Independent         Comments: was golfing 2x/wk up until about a month ago when he started falling alot, he actually fell in colliseum going to get his vaccine     Hand Dominance   Dominant Hand: Right    Extremity/Trunk Assessment    Upper Extremity Assessment Upper Extremity Assessment: Overall WFL for tasks assessed    Lower Extremity Assessment Lower Extremity Assessment: RLE deficits/detail;LLE deficits/detail RLE Deficits / Details: R AKA LLE Deficits / Details: grossly 4/5, able to stand pvt on it and stand in walker x 1 min    Cervical / Trunk Assessment Cervical / Trunk Assessment: Normal  Communication   Communication: No difficulties  Cognition Arousal/Alertness: Awake/alert Behavior During Therapy: WFL for tasks assessed/performed Overall Cognitive Status: Within Functional Limits for tasks assessed                                 General Comments: appears to be slow to process occasionally however could be HOH as well      General Comments General comments (skin integrity, edema, etc.): VSS    Exercises     Assessment/Plan    PT Assessment Patient needs continued PT services  PT Problem List Decreased strength;Decreased activity tolerance;Decreased balance;Decreased mobility;Decreased knowledge of use of DME;Decreased safety awareness       PT Treatment Interventions DME instruction;Gait training;Stair training;Functional mobility training;Therapeutic activities;Therapeutic exercise;Balance training;Wheelchair mobility training    PT Goals (Current goals can be found in the Care Plan section)  Acute Rehab PT Goals Patient Stated Goal: get back to golfing 1x/wek PT Goal Formulation: With patient Time For Goal Achievement: 09/04/19 Potential to Achieve Goals: Good    Frequency Min 3X/week   Barriers to discharge        Co-evaluation               AM-PAC PT "6 Clicks" Mobility  Outcome Measure Help needed turning from your back to your side while in a flat bed without using bedrails?: None Help needed moving from lying on your back to sitting on the side of a flat bed without using bedrails?: None Help needed moving to and from a bed to a chair (including a  wheelchair)?: A Little Help needed standing up from a chair using your arms (e.g., wheelchair or bedside chair)?: A Little Help needed to walk in hospital room?: A Lot Help needed climbing 3-5 steps with a railing? : A Lot 6 Click Score: 18    End of Session   Activity Tolerance: Patient tolerated treatment well Patient left: in chair;with call bell/phone within reach Nurse Communication: Mobility status PT Visit Diagnosis: Unsteadiness on feet (R26.81);History of falling (Z91.81);Difficulty in walking, not elsewhere classified (R26.2)    Time: 0865-7846 PT Time Calculation (min) (ACUTE ONLY): 27 min   Charges:   PT Evaluation $PT Eval Moderate Complexity: 1 Mod PT Treatments $Therapeutic Activity: 8-22 mins        Kittie Plater, PT, DPT Acute Rehabilitation Services Pager #: [phone] Office #: [phone]   Berline Lopes 08/21/2019, 10:45 AM

## 2019-08-21 NOTE — Progress Notes (Signed)
PROGRESS NOTE  Stephen [name] WUG:[id number] DOB: 1940/10/02   PCP: [name], MD  Patient is from: Home.  DOA: 08/19/2019 LOS: 1  Brief Narrative / Interim history: 79 year old male with history of CAD/stents, ICM (30 to 35%), liver cirrhosis due to hep C, portal hypertensive gastropathy, CKD-3A, AAA with endoleak s/p repair in 4/19 and 5/19 and HTN who was directed to come to ED by PCP for AKI.  Heart weakness, fatigue and recurrent falls for few weeks that prompted him to go to PCP.  In ED, HDS.  Hgb 9.1 (baseline). Cr 3.5 (b/l 1.5).  BUN 75.  K5.9.  Bicarb 16.  UA with moderate LE and rare bacteria.  COVID-19 negative.  CT abdomen and pelvis revealed significant interval enlargement of infrarenal AAA and right iliac artery aneurysm with possible hemorrhagic product or thrombus within aneurysm sac, interval enlargement of left parapelvic cyst and possible mild left hydronephrosis without hydroureter or obvious stone and left greater than right perinephric fat stranding and fluid.  Vascular surgery consulted.  Patient was started on IV fluid and IV ceftriaxone and admitted for AKI on CKD-3a and possible pyelonephritis.   AKI improving with IV fluid.  Patient had episode of diarrhea.  C. difficile negative.  Diarrhea resolved.  Subjective: Seen and examined earlier this morning.  No major events overnight of this morning.  No complaints.  He denies chest pain, dyspnea,, dizziness, GI or UTI symptoms.  Feels better today.  Diarrhea resolved.  Denies melena or hematochezia.  Objective: Vitals:   08/20/19 1200 08/20/19 2012 08/21/19 0500 08/21/19 1025  BP: (!) 119/55 134/60  134/62  Pulse: 68 71  71  Resp:  18  20  Temp: 98.7 F (37.1 C) 98.1 F (36.7 C)  98.3 F (36.8 C)  TempSrc: Oral   Oral  SpO2: 100% 100%  100%  Weight:   59.1 kg   Height:        Intake/Output Summary (Last 24 hours) at 08/21/2019 1143 Last data filed at 08/21/2019 0553 Gross per 24 hour  Intake  1639.71 ml  Output 250 ml  Net 1389.71 ml   Filed Weights   08/19/19 1915 08/20/19 0612 08/21/19 0500  Weight: 54.4 kg 55.3 kg 59.1 kg    Examination:  GENERAL: No acute distress.  Appears well.  HEENT: MMM.  Vision and hearing grossly intact.  NECK: Supple.  No apparent JVD.  RESP:  No IWOB. Good air movement bilaterally. CVS:  RRR. Heart sounds normal.  ABD/GI/GU: Bowel sounds present. Soft. Non tender.  MSK/EXT:  Moves extremities. No apparent deformity. No edema.  SKIN: no apparent skin lesion or wound NEURO: Awake, alert and oriented appropriately.  No apparent focal neuro deficit. PSYCH: Calm. Normal affect.   GENERAL: No acute distress.  Appears well.  HEENT: MMM.  Vision and hearing grossly intact.  NECK: Supple.  No apparent JVD.  RESP:  No IWOB. Good air movement bilaterally. CVS:  RRR. Heart sounds normal.  ABD/GI/GU: Bowel sounds present. Soft. Non tender.  MSK/EXT: Right AKA.  No edema in LLE. SKIN: no apparent skin lesion or wound NEURO: Awake, alert and oriented appropriately.  No apparent focal neuro deficit. PSYCH: Calm. Normal affect.  Procedures:  None  Assessment & Plan: AKI on CKD-3A: Baseline Cr ~1.5 > 3.5 (on 3/16) > 3.51 (admit) >> 2.91.  BUN in 70> 63.  Suspect prerenal with azotemia.  Not sure if AAA would play a role in this.  There is also left-sided mild hydronephrosis  without hydroureter or renal stone.  He is also on low-dose ramipril and Aldactone. Denies NSAID use. FENa 1.3% suggesting intrinsic etiology.  Overall, renal function improving. -Continue IV normal saline at 75 cc an hour -Restraints I's and O's -Avoid nephrotoxic meds. -Continue monitoring  AAA with endoleak status post repairs x2-CT abdomen and pelvis concerning for significant extension (about 1 cm enlargement over the last 1 year). -Vascular surgery following. -Optimal BP control-increase Coreg to 18.75 mg twice daily. -Continue aspirin and statin  Systolic CHF/ICM: Echo  in 2019 with EF of 30 to 35%, indeterminate diastolic function, mild to moderate mitral regurg.  No cardiopulmonary symptoms.  BNP 271.  CXR without acute finding.  Appears hypovolemic.  Not on diuretics other than low-dose Aldactone at home. -Update echocardiogram -Hold Aldactone in the setting of AKI and hyperkalemia -Continue gentle IV fluid -Monitor fluid status, renal function and electrolytes  CAD s/p DES to LAD in 2013 stents: No anginal symptoms. -Continue Coreg, statin and aspirin.  Non-anion gap metabolic acidosis: Likely due to AKI.  Improved. -Continue monitoring  Hyperkalemia: Resolved. -Continue monitoring  Pyelonephritis?  UA and clinical picture not convincing -Continue ceftriaxone pending urine culture  Anemia of renal disease: Baseline Hgb~11> 9.2 (3/16)> 9.1 (admit)> 8.9> 7.1.  Anemia panel consistent with anemia of chronic disease.  Patient denies melena or hematochezia.  Hemodilution? -FOBT -Recheck CBC today  Diarrhea: C. difficile negative.  Diarrhea resolved. -Hold Zetia  History of hepatitis C with cirrhosis: Treated? -Outpatient follow-up  Moderate malnutrition:Body mass index is 19.81 kg/m. Wt Readings from Last 10 Encounters:  08/21/19 59.1 kg  08/16/19 54.6 kg  05/13/19 59.4 kg  05/04/19 59 kg  11/04/18 59.4 kg  06/14/18 60.3 kg  05/03/18 61 kg  04/26/18 60.7 kg  04/23/18 60.4 kg  12/23/17 60.1 kg  -Dietitian consulted.  Debility/recurrent fall/history of right AKA -PT/OT      Nutrition Problem: Inadequate oral intake Etiology: poor appetite  Signs/Symptoms: other (comment)(per RN report)  Interventions: Ensure Enlive (each supplement provides 350kcal and 20 grams of protein), MVI    DVT prophylaxis: SCD Code Status: Full code Family Communication: Patient and/or RN. Available if any question.   Discharge barrier: Acute kidney injury requiring IV fluid, possible pyelonephritis, evaluation and treatment of enlarging AAA Patient  is from: Home Final disposition: To be determined  Consultants: Vascular surgery   Microbiology summarized: COVID-19 negative Blood cultures negative C. difficile negative Urine cultures pending  Sch Meds:  Scheduled Meds: . aspirin EC  81 mg Oral QHS  . atorvastatin  40 mg Oral q1800  . carvedilol  12.5 mg Oral BID WC  . feeding supplement (ENSURE ENLIVE)  237 mL Oral TID BM  . multivitamin with minerals  1 tablet Oral Daily  . sodium bicarbonate  650 mg Oral TID   Continuous Infusions: . sodium chloride 75 mL/hr at 08/21/19 0114  . cefTRIAXone (ROCEPHIN)  IV 1 g (08/21/19 0553)   PRN Meds:.ondansetron **OR** ondansetron (ZOFRAN) IV  Antimicrobials: Anti-infectives (From admission, onward)   Start     Dose/Rate Route Frequency Ordered Stop   08/20/19 0315  cefTRIAXone (ROCEPHIN) 1 g in sodium chloride 0.9 % 100 mL IVPB    Note to Pharmacy: 1st dose now if not given in ed   1 g 200 mL/hr over 30 Minutes Intravenous Daily 08/20/19 0309         I have personally reviewed the following labs and images: CBC: Recent Labs  Lab 08/16/19 1215 08/19/19 1924 08/19/19  2144 08/21/19 0313  WBC 9.9 10.7* 9.7 6.9  NEUTROABS 8.6*  --  8.6*  --   HGB 9.2* 9.1* 8.9* 7.1*  HCT 26.6* 28.3* 28.0* 22.9*  MCV 99.7 104.4* 102.2* 106.0*  PLT 228.0 321 309 258   BMP &GFR Recent Labs  Lab 08/19/19 1924 08/20/19 0210 08/20/19 0330 08/20/19 1117 08/21/19 0313  NA 135 138 135 144 142  K 5.9* 6.1* 6.1* 4.8 4.8  CL 110 116* 112* 116* 114*  CO2 16* 14* 13* 17* 17*  GLUCOSE 157* 115* 115* 136* 126*  BUN 75* 72* 75* 72* 63*  CREATININE 3.51* 3.32* 3.36* 3.43* 2.91*  CALCIUM 8.4* 7.8* 8.2* 8.4* 7.8*  MG  --   --   --   --  2.1  PHOS  --   --   --   --  3.6   Estimated Creatinine Clearance: 17.2 mL/min (A) (by C-G formula based on SCr of 2.91 mg/dL (H)). Liver & Pancreas: Recent Labs  Lab 08/16/19 1215 08/19/19 2144 08/21/19 0313  AST 113* 64*  --   ALT 117* 80*  --     ALKPHOS 104 98  --   BILITOT 0.7 0.7  --   PROT 5.6* 6.1*  --   ALBUMIN 2.7* 2.1* 1.7*   No results for input(s): LIPASE, AMYLASE in the last 168 hours. No results for input(s): AMMONIA in the last 168 hours. Diabetic: No results for input(s): HGBA1C in the last 72 hours. No results for input(s): GLUCAP in the last 168 hours. Cardiac Enzymes: No results for input(s): CKTOTAL, CKMB, CKMBINDEX, TROPONINI in the last 168 hours. No results for input(s): PROBNP in the last 8760 hours. Coagulation Profile: No results for input(s): INR, PROTIME in the last 168 hours. Thyroid Function Tests: Recent Labs    08/20/19 0149  TSH 0.874   Lipid Profile: No results for input(s): CHOL, HDL, LDLCALC, TRIG, CHOLHDL, LDLDIRECT in the last 72 hours. Anemia Panel: No results for input(s): VITAMINB12, FOLATE, FERRITIN, TIBC, IRON, RETICCTPCT in the last 72 hours. Urine analysis:    Component Value Date/Time   COLORURINE YELLOW 08/19/2019 1922   APPEARANCEUR HAZY (A) 08/19/2019 1922   LABSPEC 1.015 08/19/2019 1922   PHURINE 5.0 08/19/2019 [place] NEGATIVE 08/19/2019 [place] NEGATIVE 08/19/2019 [place] NEGATIVE 08/19/2019 [place] NEGATIVE 08/19/2019 1922   PROTEINUR 30 (A) 08/19/2019 1922   UROBILINOGEN 1.0 09/26/2011 0850   NITRITE NEGATIVE 08/19/2019 1922   LEUKOCYTESUR MODERATE (A) 08/19/2019 1922   Sepsis Labs: Invalid input(s): PROCALCITONIN, [place]  Microbiology: Recent Results (from the past 240 hour(s))  SARS CORONAVIRUS 2 (TAT 6-24 HRS) Nasopharyngeal Nasopharyngeal Swab     Status: None   Collection Time: 08/19/19 11:13 PM   Specimen: Nasopharyngeal Swab  Result Value Ref Range Status   SARS Coronavirus 2 NEGATIVE NEGATIVE Final    Comment: (NOTE) SARS-CoV-2 target nucleic acids are NOT DETECTED. The SARS-CoV-2 RNA is generally detectable in upper and lower respiratory specimens during the acute phase of infection. Negative results do  not preclude SARS-CoV-2 infection, do not rule out co-infections with other pathogens, and should not be used as the sole basis for treatment or other patient management decisions. Negative results must be combined with clinical observations, patient history, and epidemiological information. The expected result is Negative. Fact Sheet for Patients: SugarRoll.be Fact Sheet for Healthcare Providers: https://www.woods-mathews.com/ This test is not yet approved or cleared by the [place] FDA and  has  been authorized for detection and/or diagnosis of SARS-CoV-2 by FDA under an Emergency Use Authorization (EUA). This EUA will remain  in effect (meaning this test can be used) for the duration of the COVID-19 declaration under Section 56 4(b)(1) of the Act, 21 U.S.C. section 360bbb-3(b)(1), unless the authorization is terminated or revoked sooner. Performed at [facility], [place] [address], [place], [place] [address]   Culture, blood (routine x 2)     Status: None (Preliminary result)   Collection Time: 08/20/19  4:28 AM   Specimen: BLOOD RIGHT WRIST  Result Value Ref Range Status   Specimen Description BLOOD RIGHT WRIST  Final   Special Requests   Final    BOTTLES DRAWN AEROBIC AND ANAEROBIC Blood Culture adequate volume   Culture   Final    NO GROWTH 1 DAY Performed at [facility], [place] [address] [place], [place] [address]    Report Status PENDING  Incomplete  Culture, blood (routine x 2)     Status: None (Preliminary result)   Collection Time: 08/20/19  4:50 AM   Specimen: BLOOD LEFT FOREARM  Result Value Ref Range Status   Specimen Description BLOOD LEFT FOREARM  Final   Special Requests   Final    BOTTLES DRAWN AEROBIC AND ANAEROBIC Blood Culture adequate volume   Culture   Final    NO GROWTH 1 DAY Performed at [facility], [place] [address], [place], [place] [address]    Report Status PENDING  Incomplete  C  difficile quick scan w PCR reflex     Status: None   Collection Time: 08/20/19  4:58 PM   Specimen: STOOL  Result Value Ref Range Status   C Diff antigen NEGATIVE NEGATIVE Final   C Diff toxin NEGATIVE NEGATIVE Final   C Diff interpretation No C. difficile detected.  Final    Comment: Performed at [facility], [place] [address] [place], [place] [address]    Radiology Studies: No results found.   Sherly Brodbeck T. [place]  If 7PM-7AM, please contact night-coverage [email or web address] Password [facility] 08/21/2019, 11:43 AM

## 2019-08-21 NOTE — Evaluation (Signed)
Occupational Therapy Evaluation Patient Details Name: Stephen [name] MRN: [id number] DOB: [birth year] Today's Date: 08/21/2019    History of Present Illness Stephen [name] is a 79 y.o. male with medical history significant of CAD status post MI/stents,Ischemic cardiomyopathy with EF of around 30 to 35%, history of cirrhosis due to hepatitis C associated portal hypertensive gastropathy, CKD stage IIIa, abdominal aortic aneurysm with repair x2, hypertension.  Pt presents to  ED in referral from his PCP due to abnormal renal function noted on labs. CT of abdomen noted significant enlargement of abdominal aortic aneurysm and mild hydronephrosis.   Clinical Impression   Pt PTA: pt with recent falls; living at home with family. Reports being independent with ADL a few weeks ago, but has since increased weakness. Pt limited by weakness and poor ability to care for self other than in sitting. Pt currently minguardA for transfers and modified independence for ADL in sitting. Pt asked to bring in RLE prosthetic to increase functional mobility and standing tolerance prior to discharge. Pt would benefit from continued OT skilled services for ADL in standing and energy conservation techniques. OT following.     Follow Up Recommendations  Home health OT    Equipment Recommendations  None recommended by OT    Recommendations for Other Services       Precautions / Restrictions Precautions Precautions: Fall Precaution Comments: freq falls, R AKA Restrictions Weight Bearing Restrictions: No      Mobility Bed Mobility Overal bed mobility: Modified Independent             General bed mobility comments: HOB elevated, increased time, used bed rail  Transfers Overall transfer level: Needs assistance Equipment used: Rolling walker (2 wheeled) Transfers: Sit to/from [employer] Sit to Stand: Min guard   Squat pivot transfers: Min guard     General transfer comment:  MinguardA for stability and cueing to assist with hand placement    Balance Overall balance assessment: Needs assistance Sitting-balance support: Feet unsupported;No upper extremity supported Sitting balance-Leahy Scale: Good     Standing balance support: Bilateral upper extremity supported Standing balance-Leahy Scale: Poor Standing balance comment: absence of RLE prosthetic                           ADL either performed or assessed with clinical judgement   ADL Overall ADL's : Needs assistance/impaired Eating/Feeding: Modified independent;Sitting   Grooming: Modified independent;Sitting   Upper Body Bathing: Modified independent;Sitting   Lower Body Bathing: Modified independent;Sitting/lateral leans   Upper Body Dressing : Modified independent;Sitting   Lower Body Dressing: Min guard;Sitting/lateral leans;Sit to/from stand Lower Body Dressing Details (indicate cue type and reason): pt did not have prosthetic in room with him Toilet Transfer: Min Dietitian Details (indicate cue type and reason): bed <-> recliner Toileting- Clothing Manipulation and Hygiene: Minimal assistance;Cueing for safety;Sitting/lateral lean;Sit to/from stand       Functional mobility during ADLs: Min guard;Cueing for safety General ADL Comments: Pt limited by pt not having RLE prosthetic in room; pt modified independent for most ADL in sitting.     Vision Baseline Vision/History: No visual deficits Vision Assessment?: No apparent visual deficits     Perception     Praxis      Pertinent Vitals/Pain Pain Assessment: No/denies pain     Hand Dominance Right   Extremity/Trunk Assessment Upper Extremity Assessment Upper Extremity Assessment: Overall WFL for tasks assessed   Lower Extremity Assessment Lower  Extremity Assessment: RLE deficits/detail RLE Deficits / Details: R AKA   Cervical / Trunk Assessment Cervical / Trunk Assessment: Normal    Communication Communication Communication: No difficulties   Cognition Arousal/Alertness: Awake/alert Behavior During Therapy: WFL for tasks assessed/performed Overall Cognitive Status: Within Functional Limits for tasks assessed                                 General Comments: appears to be slow to process occasionally however could be HOH as well   General Comments  VSS on RA    Exercises     Shoulder Instructions      Home Living Family/patient expects to be discharged to:: Private residence Living Arrangements: Spouse/significant other Available Help at Discharge: Family;Available 24 hours/day Type of Home: House Home Access: Stairs to enter [employer] of Steps: 4 Entrance Stairs-Rails: Right Home Layout: One level     Bathroom Shower/Tub: Occupational psychologist: Handicapped height     Home Equipment: Environmental consultant - 2 wheels;Cane - single point;Shower seat   Additional Comments: R LE prosthesis      Prior Functioning/Environment Level of Independence: Independent        Comments: was golfing 2x/wk up until about a month ago when he started falling alot, he actually fell in [place] going to get his vaccine        OT Problem List: Decreased activity tolerance;Decreased strength;Impaired balance (sitting and/or standing);Decreased safety awareness;Pain;Decreased knowledge of use of DME or AE      OT Treatment/Interventions: Self-care/ADL training;Therapeutic exercise;Energy conservation;DME and/or AE instruction;Therapeutic activities;Patient/family education;Balance training    OT Goals(Current goals can be found in the care plan section) Acute Rehab OT Goals Patient Stated Goal: get back to golfing 1x/wek OT Goal Formulation: With patient Time For Goal Achievement: 09/04/19 Potential to Achieve Goals: Good ADL Goals Pt Will Perform Lower Body Dressing: with modified independence;sitting/lateral leans;sit to/from  stand Pt/caregiver will Perform Home Exercise Program: Increased strength;Both right and left upper extremity;With Supervision Additional ADL Goal #1: Pt will increase to x5 mins standing tolerance in prep for ADL with supervisionA (when RLE prosthetic is available) Additional ADL Goal #2: Pt will state 3 energy conservation techniques to utilize for safe mobility and ADL tasks.  OT Frequency: Min 2X/week   Barriers to D/C:            Co-evaluation              AM-PAC OT "6 Clicks" Daily Activity     Outcome Measure Help from another person eating meals?: None Help from another person taking care of personal grooming?: None Help from another person toileting, which includes using toliet, bedpan, or urinal?: A Little Help from another person bathing (including washing, rinsing, drying)?: A Little Help from another person to put on and taking off regular upper body clothing?: None Help from another person to put on and taking off regular lower body clothing?: A Little 6 Click Score: 21   End of Session Equipment Utilized During Treatment: Gait belt Nurse Communication: Mobility status  Activity Tolerance: Patient tolerated treatment well Patient left: in bed;with call bell/phone within reach;with bed alarm set  OT Visit Diagnosis: Unsteadiness on feet (R26.81);Muscle weakness (generalized) (M62.81);Pain Pain - part of body: (back)                Time: 3500-9381 OT Time Calculation (min): 20 min Charges:  OT General Charges $OT Visit:  1 Visit OT Evaluation $OT Eval Moderate Complexity: 1 Mod  Jefferey Pica, OTR/L Acute Rehabilitation Services Pager: [phone] Office: [phone]   Fareedah Mahler C 08/21/2019, 4:01 PM

## 2019-08-22 LAB — RENAL FUNCTION PANEL
Albumin: 1.6 g/dL — ABNORMAL LOW (ref 3.5–5.0)
Anion gap: 12 (ref 5–15)
BUN: 47 mg/dL — ABNORMAL HIGH (ref 8–23)
CO2: 18 mmol/L — ABNORMAL LOW (ref 22–32)
Calcium: 7.9 mg/dL — ABNORMAL LOW (ref 8.9–10.3)
Chloride: 114 mmol/L — ABNORMAL HIGH (ref 98–111)
Creatinine, Ser: 2.48 mg/dL — ABNORMAL HIGH (ref 0.61–1.24)
GFR calc Af Amer: 28 mL/min — ABNORMAL LOW (ref >60)
GFR calc non Af Amer: 24 mL/min — ABNORMAL LOW (ref >60)
Glucose, Bld: 138 mg/dL — ABNORMAL HIGH (ref 70–99)
Phosphorus: 2.9 mg/dL (ref 2.5–4.6)
Potassium: 4.9 mmol/L (ref 3.5–5.1)
Sodium: 144 mmol/L (ref 135–145)

## 2019-08-22 LAB — PROCALCITONIN: Procalcitonin: <0.1 ng/mL

## 2019-08-22 LAB — CBC
HCT: 25.2 % — ABNORMAL LOW (ref 39.0–52.0)
Hemoglobin: 8 g/dL — ABNORMAL LOW (ref 13.0–17.0)
MCH: 32.9 pg (ref 26.0–34.0)
MCHC: 31.7 g/dL (ref 30.0–36.0)
MCV: 103.7 fL — ABNORMAL HIGH (ref 80.0–100.0)
Platelets: 289 10*3/uL (ref 150–400)
RBC: 2.43 MIL/uL — ABNORMAL LOW (ref 4.22–5.81)
RDW: 14.2 % (ref 11.5–15.5)
WBC: 8.2 10*3/uL (ref 4.0–10.5)
nRBC: 0 % (ref 0.0–0.2)

## 2019-08-22 LAB — MAGNESIUM: Magnesium: 1.9 mg/dL (ref 1.7–2.4)

## 2019-08-22 NOTE — Telephone Encounter (Signed)
I see in in [employer] that the latest updated office note was received by [facility].

## 2019-08-22 NOTE — Progress Notes (Signed)
PROGRESS NOTE  Stephen [name] HGD:[id number] DOB: April 02, 1941   PCP: [name], MD  Patient is from: Home.  DOA: 08/19/2019 LOS: 2  Brief Narrative / Interim history: 79 year old male with history of CAD/stents, ICM (30 to 35%), liver cirrhosis due to hep C, portal hypertensive gastropathy, CKD-3A, AAA with endoleak s/p repair in 4/19 and 5/19 and HTN who was directed to come to ED by PCP for AKI.  Heart weakness, fatigue and recurrent falls for few weeks that prompted him to go to PCP.  In ED, HDS.  Hgb 9.1 (baseline). Cr 3.5 (b/l 1.5).  BUN 75.  K5.9.  Bicarb 16.  UA with moderate LE and rare bacteria.  COVID-19 negative.  CT abdomen and pelvis revealed significant interval enlargement of infrarenal AAA and right iliac artery aneurysm with possible hemorrhagic product or thrombus within aneurysm sac, interval enlargement of left parapelvic cyst and possible mild left hydronephrosis without hydroureter or obvious stone and left greater than right perinephric fat stranding and fluid.  Vascular surgery consulted.  Patient was started on IV fluid and IV ceftriaxone and admitted for AKI on CKD-3a and possible pyelonephritis.   AKI improving with IV fluid.  Patient had episode of diarrhea.  C. difficile negative.  Diarrhea resolved.  Subjective: Seen and examined earlier this morning.  No major events overnight or this morning.  No complaints.  He denies chest pain, dyspnea, dizziness, GI or UTI symptoms.  Reports making good amount of urine.  Denies melena or hematochezia.  Objective: Vitals:   08/21/19 1655 08/21/19 2238 08/22/19 0300 08/22/19 0837  BP: 133/64 (!) 124/56  120/61  Pulse: 72 75  75  Resp: 20 18  18   Temp: 98.4 F (36.9 C) 98.2 F (36.8 C)  98.9 F (37.2 C)  TempSrc: Oral Oral  Oral  SpO2: 99% 97%  98%  Weight:   60.2 kg   Height:        Intake/Output Summary (Last 24 hours) at 08/22/2019 1203 Last data filed at 08/22/2019 0603 Gross per 24 hour  Intake  2234.45 ml  Output 351 ml  Net 1883.45 ml   Filed Weights   08/20/19 0612 08/21/19 0500 08/22/19 0300  Weight: 55.3 kg 59.1 kg 60.2 kg    Examination:  GENERAL: No acute distress.  Appears well.  HEENT: MMM.  Vision and hearing grossly intact.  NECK: Supple.  No apparent JVD.  RESP:  No IWOB. Good air movement bilaterally. CVS:  RRR. Heart sounds normal.  ABD/GI/GU: Bowel sounds present. Soft. Non tender.  MSK/EXT: Right AKA.  No edema in LLE. SKIN: no apparent skin lesion or wound NEURO: Awake, alert and oriented appropriately.  No apparent focal neuro deficit. PSYCH: Calm. Normal affect.  Procedures:  None  Assessment & Plan: AKI on CKD-3A: Baseline Cr ~1.5 > 3.5 (on 3/16) > 3.51 (admit) >> 2.91> 2.48.  BUN in 70> 47.  Suspect prerenal with azotemia.  LFTs notable for hypokalemia not sure if AAA would play a role in this.  There is also left-sided mild hydronephrosis without hydroureter or renal stone.  He is also on low-dose ramipril and Aldactone. Denies NSAID use.  Overall, renal function improving. -Continue IV normal saline at 75 cc an hour -Restraints I's and O's -Avoid nephrotoxic meds. -Continue monitoring  AAA with endoleak status post repairs x2-CT abdomen and pelvis concerning for significant extension (about 1 cm enlargement over the last 1 year). -Vascular surgery following. -Optimal BP control-increase Coreg to 18.75 mg twice daily. -Continue  aspirin and statin  Systolic CHF/ICM: Echo in 2019 with EF of 30 to 35%, indeterminate diastolic function, mild to moderate mitral regurg.  No cardiopulmonary symptoms.  BNP 271.  CXR without acute finding.  Appears hypovolemic.  Not on diuretics other than low-dose Aldactone at home. -Follow echocardiogram. -Hold Aldactone in the setting of AKI and hyperkalemia -Monitor fluid status, renal function and electrolytes  CAD s/p DES to LAD in 2013 stents: No anginal symptoms. -Continue Coreg, statin and  aspirin.  Non-anion gap metabolic acidosis: Likely due to AKI.  Improved. -Continue monitoring  Hyperkalemia: Resolved. -Continue monitoring  Pyelonephritis ruled out.  Clinical picture, urinalysis and urine culture not convincing. -Ceftriaxone discontinued.  Anemia of renal disease: Baseline Hgb~11> 9.2 (3/16)> 9.1 (admit)> 8.9> 7.1> 8.0.  Anemia panel consistent with anemia of chronic disease.  Patient denies melena or hematochezia.  -FOBT -Continue monitoring  Diarrhea: C. difficile negative.  Diarrhea resolved. -Hold Zetia  History of hepatitis C with cirrhosis: Treated? -Outpatient follow-up  Moderate malnutrition:Body mass index is 20.18 kg/m. Wt Readings from Last 10 Encounters:  08/22/19 60.2 kg  08/16/19 54.6 kg  05/13/19 59.4 kg  05/04/19 59 kg  11/04/18 59.4 kg  06/14/18 60.3 kg  05/03/18 61 kg  04/26/18 60.7 kg  04/23/18 60.4 kg  12/23/17 60.1 kg  -Dietitian consulted.  Debility/recurrent fall/history of right AKA -PT/OT      Nutrition Problem: Inadequate oral intake Etiology: poor appetite  Signs/Symptoms: other (comment)(per RN report)  Interventions: Ensure Enlive (each supplement provides 350kcal and 20 grams of protein), MVI    DVT prophylaxis: SCD Code Status: Full code Family Communication: Patient and/or RN. Available if any question.   Discharge barrier: Acute kidney injury requiring IV fluid, evaluation and treatment of enlarging AAA Patient is from: Home Final disposition: To be determined  Consultants: Vascular surgery   Microbiology summarized: COVID-19 negative Blood cultures negative C. difficile negative Urine cultures pending  Sch Meds:  Scheduled Meds: . aspirin EC  81 mg Oral QHS  . atorvastatin  40 mg Oral q1800  . carvedilol  18.75 mg Oral BID WC  . feeding supplement (ENSURE ENLIVE)  237 mL Oral TID BM  . multivitamin with minerals  1 tablet Oral Daily  . sodium bicarbonate  650 mg Oral TID   Continuous  Infusions: . sodium chloride 75 mL/hr at 08/22/19 0603   PRN Meds:.ondansetron **OR** ondansetron (ZOFRAN) IV  Antimicrobials: Anti-infectives (From admission, onward)   Start     Dose/Rate Route Frequency Ordered Stop   08/20/19 0315  cefTRIAXone (ROCEPHIN) 1 g in sodium chloride 0.9 % 100 mL IVPB  Status:  Discontinued    Note to Pharmacy: 1st dose now if not given in ed   1 g 200 mL/hr over 30 Minutes Intravenous Daily 08/20/19 0309 08/21/19 1610       I have personally reviewed the following labs and images: CBC: Recent Labs  Lab 08/16/19 1215 08/16/19 1215 08/19/19 1924 08/19/19 2144 08/21/19 0313 08/21/19 1141 08/22/19 0745  WBC 9.9  --  10.7* 9.7 6.9  --  8.2  NEUTROABS 8.6*  --   --  8.6*  --   --   --   HGB 9.2*   < > 9.1* 8.9* 7.1* 7.4* 8.0*  HCT 26.6*   < > 28.3* 28.0* 22.9* 22.6* 25.2*  MCV 99.7  --  104.4* 102.2* 106.0*  --  103.7*  PLT 228.0  --  321 309 258  --  289   < > =  values in this interval not displayed.   BMP &GFR Recent Labs  Lab 08/20/19 0210 08/20/19 0330 08/20/19 1117 08/21/19 0313 08/22/19 0745  NA 138 135 144 142 144  K 6.1* 6.1* 4.8 4.8 4.9  CL 116* 112* 116* 114* 114*  CO2 14* 13* 17* 17* 18*  GLUCOSE 115* 115* 136* 126* 138*  BUN 72* 75* 72* 63* 47*  CREATININE 3.32* 3.36* 3.43* 2.91* 2.48*  CALCIUM 7.8* 8.2* 8.4* 7.8* 7.9*  MG  --   --   --  2.1 1.9  PHOS  --   --   --  3.6 2.9   Estimated Creatinine Clearance: 20.6 mL/min (A) (by C-G formula based on SCr of 2.48 mg/dL (H)). Liver & Pancreas: Recent Labs  Lab 08/16/19 1215 08/19/19 2144 08/21/19 0313 08/22/19 0745  AST 113* 64*  --   --   ALT 117* 80*  --   --   ALKPHOS 104 98  --   --   BILITOT 0.7 0.7  --   --   PROT 5.6* 6.1*  --   --   ALBUMIN 2.7* 2.1* 1.7* 1.6*   No results for input(s): LIPASE, AMYLASE in the last 168 hours. No results for input(s): AMMONIA in the last 168 hours. Diabetic: No results for input(s): HGBA1C in the last 72 hours. No results  for input(s): GLUCAP in the last 168 hours. Cardiac Enzymes: No results for input(s): CKTOTAL, CKMB, CKMBINDEX, TROPONINI in the last 168 hours. No results for input(s): PROBNP in the last 8760 hours. Coagulation Profile: No results for input(s): INR, PROTIME in the last 168 hours. Thyroid Function Tests: Recent Labs    08/20/19 0149  TSH 0.874   Lipid Profile: No results for input(s): CHOL, HDL, LDLCALC, TRIG, CHOLHDL, LDLDIRECT in the last 72 hours. Anemia Panel: No results for input(s): VITAMINB12, FOLATE, FERRITIN, TIBC, IRON, RETICCTPCT in the last 72 hours. Urine analysis:    Component Value Date/Time   COLORURINE YELLOW 08/19/2019 1922   APPEARANCEUR HAZY (A) 08/19/2019 1922   LABSPEC 1.015 08/19/2019 1922   PHURINE 5.0 08/19/2019 [place] NEGATIVE 08/19/2019 [place] NEGATIVE 08/19/2019 [place] NEGATIVE 08/19/2019 [place] NEGATIVE 08/19/2019 1922   PROTEINUR 30 (A) 08/19/2019 1922   UROBILINOGEN 1.0 09/26/2011 0850   NITRITE NEGATIVE 08/19/2019 1922   LEUKOCYTESUR MODERATE (A) 08/19/2019 1922   Sepsis Labs: Invalid input(s): PROCALCITONIN, [place]  Microbiology: Recent Results (from the past 240 hour(s))  SARS CORONAVIRUS 2 (TAT 6-24 HRS) Nasopharyngeal Nasopharyngeal Swab     Status: None   Collection Time: 08/19/19 11:13 PM   Specimen: Nasopharyngeal Swab  Result Value Ref Range Status   SARS Coronavirus 2 NEGATIVE NEGATIVE Final    Comment: (NOTE) SARS-CoV-2 target nucleic acids are NOT DETECTED. The SARS-CoV-2 RNA is generally detectable in upper and lower respiratory specimens during the acute phase of infection. Negative results do not preclude SARS-CoV-2 infection, do not rule out co-infections with other pathogens, and should not be used as the sole basis for treatment or other patient management decisions. Negative results must be combined with clinical observations, patient history, and epidemiological information.  The expected result is Negative. Fact Sheet for Patients: SugarRoll.be Fact Sheet for Healthcare Providers: https://www.woods-mathews.com/ This test is not yet approved or cleared by the [place] FDA and  has been authorized for detection and/or diagnosis of SARS-CoV-2 by FDA under an Emergency Use Authorization (EUA). This EUA will remain  in effect (meaning  this test can be used) for the duration of the COVID-19 declaration under Section 56 4(b)(1) of the Act, 21 U.S.C. section 360bbb-3(b)(1), unless the authorization is terminated or revoked sooner. Performed at [facility], [place] [address], [place], [place] [address]   Culture, blood (routine x 2)     Status: None (Preliminary result)   Collection Time: 08/20/19  4:28 AM   Specimen: BLOOD RIGHT WRIST  Result Value Ref Range Status   Specimen Description BLOOD RIGHT WRIST  Final   Special Requests   Final    BOTTLES DRAWN AEROBIC AND ANAEROBIC Blood Culture adequate volume   Culture   Final    NO GROWTH 1 DAY Performed at [facility], [place] [address] [place], [place] [address]    Report Status PENDING  Incomplete  Culture, blood (routine x 2)     Status: None (Preliminary result)   Collection Time: 08/20/19  4:50 AM   Specimen: BLOOD LEFT FOREARM  Result Value Ref Range Status   Specimen Description BLOOD LEFT FOREARM  Final   Special Requests   Final    BOTTLES DRAWN AEROBIC AND ANAEROBIC Blood Culture adequate volume   Culture   Final    NO GROWTH 1 DAY Performed at [facility], [place] [address], [place], [place] [address]    Report Status PENDING  Incomplete  Culture, Urine     Status: Abnormal   Collection Time: 08/20/19  4:06 PM   Specimen: Urine, Clean Catch  Result Value Ref Range Status   Specimen Description URINE, CLEAN CATCH  Final   Special Requests   Final    NONE Performed at [facility], [place] [address], [place], [place] [address]     Culture <10,000 COLONIES/mL INSIGNIFICANT GROWTH (A)  Final   Report Status 08/21/2019 FINAL  Final  C difficile quick scan w PCR reflex     Status: None   Collection Time: 08/20/19  4:58 PM   Specimen: STOOL  Result Value Ref Range Status   C Diff antigen NEGATIVE NEGATIVE Final   C Diff toxin NEGATIVE NEGATIVE Final   C Diff interpretation No C. difficile detected.  Final    Comment: Performed at [facility], [place] [address] [place], [place] [address]    Radiology Studies: ECHOCARDIOGRAM COMPLETE  Result Date: 08/21/2019    ECHOCARDIOGRAM REPORT   Patient Name:   KNUTE MAZZUCA Date of Exam: 08/21/2019 Medical Rec #:  [id number]        Height:       68.0 in Accession #:    [id number]       Weight:       130.3 lb Date of Birth:  June 22, 1940       BSA:          1.703 m Patient Age:    11 years         BP:           136/64 mmHg Patient Gender: M                HR:           71 bpm. Exam Location:  Inpatient Procedure: 2D Echo, Cardiac Doppler and Color Doppler Indications:    Cardiomyopathy-Ischemic 414.8 / I25.5  History:        Patient has prior history of Echocardiogram examinations, most  recent 12/21/2017. Cardiomyopathy, Previous Myocardial                 Infarction; Risk Factors:Hypertension, Dyslipidemia and Tobacco                 use. S/P coronary artery stent placement, 4 DES to LAD. 09/21/11.  Sonographer:    Alvino Chapel RCS Referring Phys: [id number] Charlesetta Ivory Shaunette Gassner IMPRESSIONS  1. LVEF is approxiamtely 35% with akinesis of the anteroseptum, mid/distal inferoseptum, distal inferior and apical walls. . Left ventricular ejection fraction, by estimation, is 35%%. The left ventricle has mildly decreased function. The left ventricle  demonstrates regional wall motion abnormalities (see scoring diagram/findings for description). The left ventricular internal cavity size was moderately dilated. Left ventricular diastolic parameters are consistent with Grade I diastolic  dysfunction (impaired relaxation).  2. Right ventricular systolic function is normal. The right ventricular size is normal.  3. Left atrial size was mildly dilated.  4. The mitral valve is grossly normal. Mild mitral valve regurgitation.  5. The aortic valve is abnormal. Aortic valve regurgitation is trivial. Mild aortic valve sclerosis is present, with no evidence of aortic valve stenosis.  6. The inferior vena cava is normal in size with greater than 50% respiratory variability, suggesting right atrial pressure of 3 mmHg. FINDINGS  Left Ventricle: LVEF is approxiamtely 35% with akinesis of the anteroseptum, mid/distal inferoseptum, distal inferior and apical walls. Left ventricular ejection fraction, by estimation, is 35%%. The left ventricle has mildly decreased function. The left ventricle demonstrates regional wall motion abnormalities. Definity contrast agent was given IV to delineate the left ventricular endocardial borders. The left ventricular internal cavity size was moderately dilated. There is no left ventricular hypertrophy. Left ventricular diastolic parameters are consistent with Grade I diastolic dysfunction (impaired relaxation). Right Ventricle: The right ventricular size is normal. No increase in right ventricular wall thickness. Right ventricular systolic function is normal. Left Atrium: Left atrial size was mildly dilated. Right Atrium: Right atrial size was normal in size. Pericardium: There is no evidence of pericardial effusion. Mitral Valve: The mitral valve is grossly normal. Mild mitral valve regurgitation. Tricuspid Valve: The tricuspid valve is normal in structure. Tricuspid valve regurgitation is not demonstrated. Aortic Valve: The aortic valve is abnormal. Aortic valve regurgitation is trivial. Mild aortic valve sclerosis is present, with no evidence of aortic valve stenosis. Pulmonic Valve: The pulmonic valve was not well visualized. Pulmonic valve regurgitation is not visualized.  Aorta: The aortic root is normal in size and structure. Venous: The inferior vena cava is normal in size with greater than 50% respiratory variability, suggesting right atrial pressure of 3 mmHg. IAS/Shunts: No atrial level shunt detected by color flow Doppler.  LEFT VENTRICLE PLAX 2D LVIDd:         5.70 cm  Diastology LVIDs:         4.80 cm  LV e' lateral:   4.57 cm/s LV PW:         1.00 cm  LV E/e' lateral: 10.9 LV IVS:        0.70 cm  LV e' medial:    4.90 cm/s LVOT diam:     2.10 cm  LV E/e' medial:  10.2 LV SV:         55 LV SV Index:   33 LVOT Area:     3.46 cm  RIGHT VENTRICLE RV S prime:     11.10 cm/s TAPSE (M-mode): 1.8 cm LEFT ATRIUM  Index       RIGHT ATRIUM           Index LA diam:        3.30 cm 1.94 cm/m  RA Area:     15.00 cm LA Vol (A2C):   69.2 ml 40.62 ml/m RA Volume:   40.70 ml  23.89 ml/m LA Vol (A4C):   60.6 ml 35.57 ml/m LA Biplane Vol: 64.6 ml 37.92 ml/m  AORTIC VALVE LVOT Vmax:   78.90 cm/s LVOT Vmean:  53.700 cm/s LVOT VTI:    0.160 m  AORTA Ao Root diam: 3.50 cm MITRAL VALVE MV Area (PHT): 2.45 cm    SHUNTS MV Decel Time: 310 msec    Systemic VTI:  0.16 m MV E velocity: 49.90 cm/s  Systemic Diam: 2.10 cm MV A velocity: 91.70 cm/s MV E/A ratio:  0.54 Dorris Carnes MD Electronically signed by Dorris Carnes MD Signature Date/Time: 08/21/2019/5:28:15 PM    Final      Ambry Dix T. [place]  If 7PM-7AM, please contact night-coverage [email or web address] Password [facility] 08/22/2019, 12:03 PM

## 2019-08-22 NOTE — TOC Initial Note (Signed)
Transition of Care [facility]) - Initial/Assessment Note    Patient Details  Name: Stephen [name] MRN: [id number] Date of Birth: [birth year]  Transition of Care [facility]) CM/SW Contact:    Angelita Ingles, RN Phone Number: 08/22/2019, 2:21 PM  Clinical Narrative: Cm at the bedside to discuss discharge disposition with patient. Patient states that his goal is to get home with his wife and son who are his support system. Patient request that CM contact his wife for any further questions because he "just doesn't keep up with all that stuff. Spoke with wife Jeani Hawking who reports that patient has crutches, wheelchair and walker at home. Jeani Hawking states that is on her way to pick up transport chair. Wife states that she wants to keep current services with [facility].Wife also states that she has everything she needs to take care of patient at home. No further need at this time. Will continue to follow.                Expected Discharge Plan: [place] Barriers to Discharge: Continued Medical Work up   Patient Goals and CMS Choice        Expected Discharge Plan and Services Expected Discharge Plan: [place] In-house Referral: Clinical Social Work   Post Acute Care Choice: [place] arrangements for the past 2 months: Single Family Home                           HH Arranged: (Active with [facility])          Prior Living Arrangements/Services Living arrangements for the past 2 months: [place] Lives with:: Spouse, Adult Children Patient language and need for interpreter reviewed:: Yes Do you feel safe going back to the place where you live?: Yes      Need for Family Participation in Patient Care: Yes (Comment) Care giver support system in place?: Yes (comment) Current home services: Home PT Criminal Activity/Legal Involvement Pertinent to Current Situation/Hospitalization: No - Comment as needed  Activities of Daily Living Home  Assistive Devices/Equipment: Crutches, Prosthesis, Cane (specify quad or straight) ADL Screening (condition at time of admission) Patient's cognitive ability adequate to safely complete daily activities?: Yes Is the patient deaf or have difficulty hearing?: No Does the patient have difficulty seeing, even when wearing glasses/contacts?: No Does the patient have difficulty concentrating, remembering, or making decisions?: No Patient able to express need for assistance with ADLs?: Yes Does the patient have difficulty dressing or bathing?: Yes Independently performs ADLs?: Yes (appropriate for developmental age) Does the patient have difficulty walking or climbing stairs?: Yes Weakness of Legs: Both Weakness of Arms/Hands: None  Permission Sought/Granted Permission sought to share information with : Other (comment)(Wife Santiago Glad) Permission granted to share information with : Yes, Verbal Permission Granted  Share Information with NAME: Desman Polak     Permission granted to share info w Relationship: wife     Emotional Assessment Appearance:: Appears stated age Attitude/Demeanor/Rapport: Gracious Affect (typically observed): Calm Orientation: : Oriented to Self, Oriented to Place, Oriented to  Time, Oriented to Situation Alcohol / Substance Use: Not Applicable Psych Involvement: No (comment)  Admission diagnosis:  CHF (congestive heart failure) (HCC) [I50.9] Acute renal failure (ARF) (HCC) [N17.9] AKI (acute kidney injury) ([place]) [N17.9] Acute kidney injury ([place]) [N17.9] Abdominal aortic aneurysm (AAA) without rupture ([place]) [I71.4] Patient Active Problem List   Diagnosis Date Noted  . Acute  renal failure (ARF) ([place]) 08/20/2019  . AKI (acute kidney injury) ([place]) 08/20/2019  . Recurrent falls 08/16/2019  . Lumbar pain 08/16/2019  . Early satiety 08/16/2019  . Portal hypertensive gastropathy ([place]) 05/10/2019  . Cold intolerance 05/03/2018  . CKD (chronic kidney disease) stage 3, GFR  30-59 ml/min 05/03/2018  . Endoleak post (EVAR) endovascular aneurysm repair ([place]) 01/03/2018  . Diverticulosis 01/03/2018  . Macrocytic anemia 04/28/2017  . General weakness 04/28/2016  . Prostate nodule   . Medicare annual wellness visit, subsequent 10/24/2015  . Advanced care planning/counseling discussion 10/24/2015  . Chronic hepatitis C with cirrhosis ([place]) 10/24/2015  . Thrombocytopenia ([place]) 10/24/2015  . Gait instability 01/03/2014  . Dyslipidemia 09/23/2011  . Ischemic cardiomyopathy, EF 20-25% with apical AK 09/23/2011  . S/P coronary artery stent placement, 4 DES to LAD. 09/21/11 09/22/2011  . Hx of AKA (above knee amputation) ([place]) 09/22/2011  . Tobacco use 09/22/2011  . Pseudoaneurysm of right femoral artery, thrombosed by [place] 09/22/2011  . STEMI  ant. wall, -occluded LAD-urgent PCI 09/21/2011  . HTN (hypertension) 09/21/2011    Class: History of  . AAA (abdominal aortic aneurysm) without rupture ([place]) 09/21/2011    Class: History of   PCP:  [name], MD Pharmacy:   [id number] - [place], [place] [address] Phone: [phone] Fax: [phone]     Social Determinants of Health (SDOH) Interventions    Readmission Risk Interventions No flowsheet data found.

## 2019-08-23 DIAGNOSIS — I5022 Chronic systolic (congestive) heart failure: Secondary | ICD-10-CM

## 2019-08-23 DIAGNOSIS — D631 Anemia in chronic kidney disease: Secondary | ICD-10-CM

## 2019-08-23 LAB — CBC
HCT: 25.4 % — ABNORMAL LOW (ref 39.0–52.0)
Hemoglobin: 8.1 g/dL — ABNORMAL LOW (ref 13.0–17.0)
MCH: 32.9 pg (ref 26.0–34.0)
MCHC: 31.9 g/dL (ref 30.0–36.0)
MCV: 103.3 fL — ABNORMAL HIGH (ref 80.0–100.0)
Platelets: 278 10*3/uL (ref 150–400)
RBC: 2.46 MIL/uL — ABNORMAL LOW (ref 4.22–5.81)
RDW: 14.3 % (ref 11.5–15.5)
WBC: 10.6 10*3/uL — ABNORMAL HIGH (ref 4.0–10.5)
nRBC: 0 % (ref 0.0–0.2)

## 2019-08-23 LAB — RENAL FUNCTION PANEL
Albumin: 1.7 g/dL — ABNORMAL LOW (ref 3.5–5.0)
Anion gap: 6 (ref 5–15)
BUN: 41 mg/dL — ABNORMAL HIGH (ref 8–23)
CO2: 17 mmol/L — ABNORMAL LOW (ref 22–32)
Calcium: 7.8 mg/dL — ABNORMAL LOW (ref 8.9–10.3)
Chloride: 118 mmol/L — ABNORMAL HIGH (ref 98–111)
Creatinine, Ser: 2.39 mg/dL — ABNORMAL HIGH (ref 0.61–1.24)
GFR calc Af Amer: 29 mL/min — ABNORMAL LOW (ref >60)
GFR calc non Af Amer: 25 mL/min — ABNORMAL LOW (ref >60)
Glucose, Bld: 104 mg/dL — ABNORMAL HIGH (ref 70–99)
Phosphorus: 2.8 mg/dL (ref 2.5–4.6)
Potassium: 4.9 mmol/L (ref 3.5–5.1)
Sodium: 141 mmol/L (ref 135–145)

## 2019-08-23 LAB — OCCULT BLOOD X 1 CARD TO LAB, STOOL: Fecal Occult Bld: NEGATIVE

## 2019-08-23 LAB — MAGNESIUM: Magnesium: 1.8 mg/dL (ref 1.7–2.4)

## 2019-08-23 MED ORDER — SODIUM BICARBONATE 650 MG PO TABS: 650.0000 mg | ORAL_TABLET | Freq: Two times a day (BID) | ORAL | 0 refills | Status: DC

## 2019-08-23 MED ORDER — ENSURE ENLIVE PO LIQD: 237.0000 mL | Freq: Three times a day (TID) | ORAL | 0 refills | Status: DC

## 2019-08-23 MED ORDER — CARVEDILOL 25 MG PO TABS: 25.0000 mg | ORAL_TABLET | Freq: Two times a day (BID) | ORAL | 1 refills | Status: DC

## 2019-08-23 MED ORDER — ENSURE ENLIVE PO LIQD: 237.0000 mL | Freq: Three times a day (TID) | ORAL | 0 refills | Status: AC

## 2019-08-23 MED ORDER — ACETAMINOPHEN 500 MG PO TABS: 1000.0000 mg | ORAL_TABLET | Freq: Three times a day (TID) | ORAL | 0 refills | Status: AC

## 2019-08-23 MED FILL — CARVEDILOL 25 MG TABLET: 25 | 90 days supply | Qty: 180 | Fill #0

## 2019-08-23 MED FILL — SODIUM BICARBONATE 650 MG T: 650 | 30 days supply | Qty: 60 | Fill #0

## 2019-08-23 NOTE — Progress Notes (Addendum)
  Progress Note    08/23/2019 10:43 AM  Subjective:  No abd pain.  Wants to go home.   Vitals:   08/22/19 2139 08/23/19 0809  BP: 133/62 126/65  Pulse: 79 78  Resp:    Temp: 99.2 F (37.3 C) 99.5 F (37.5 C)  SpO2: 97% 96%   Physical Exam: Lungs:  Non labored Extremities:  2+ L DP pulse Abdomen:  Soft, NT, ND Neurologic: A&O  CBC    Component Value Date/Time   WBC 10.6 (H) 08/23/2019 0310   RBC 2.46 (L) 08/23/2019 0310   HGB 8.1 (L) 08/23/2019 0310   HCT 25.4 (L) 08/23/2019 0310   PLT 278 08/23/2019 0310   MCV 103.3 (H) 08/23/2019 0310   MCH 32.9 08/23/2019 0310   MCHC 31.9 08/23/2019 0310   RDW 14.3 08/23/2019 0310   LYMPHSABS 0.4 (L) 08/19/2019 2144   MONOABS 0.4 08/19/2019 2144   EOSABS 0.2 08/19/2019 2144   BASOSABS 0.0 08/19/2019 2144    BMET    Component Value Date/Time   NA 141 08/23/2019 0310   NA 142 10/02/2017 0938   K 4.9 08/23/2019 0310   CL 118 (H) 08/23/2019 0310   CO2 17 (L) 08/23/2019 0310   GLUCOSE 104 (H) 08/23/2019 0310   BUN 41 (H) 08/23/2019 0310   BUN 24 10/02/2017 0938   CREATININE 2.39 (H) 08/23/2019 0310   CREATININE 1.12 08/15/2016 0839   CALCIUM 7.8 (L) 08/23/2019 0310   GFRNONAA 25 (L) 08/23/2019 0310   GFRAA 29 (L) 08/23/2019 0310    INR    Component Value Date/Time   INR 1.2 (H) 05/03/2019 0848     Intake/Output Summary (Last 24 hours) at 08/23/2019 1043 Last data filed at 08/23/2019 0500 Gross per 24 hour  Intake 1697.93 ml  Output --  Net 1697.93 ml     Assessment/Plan:  79 y.o. male with asymptomatic enlargement of AAA sac  Abd exam unchanged CKD: Cr remains elevated Dr. Trula Slade to evaluate patient later today and provide further treatment plans   Dagoberto Ligas, PA-C [facility] [phone] 08/23/2019 10:43 AM   I agree with the above.  I have seen and evaluated the patient.  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  He will need contrast imaging to better define his anatomy and see what the next step will be.  He came in with acute renal failure which is slowly improving.  It sounds like he is able to be discharged today and so I will arrange for follow-up with me in the office in 1 month with a CT angiogram, assuming his renal function has gotten back to baseline which was a creatinine of 1.55 in December.  Annamarie Major

## 2019-08-23 NOTE — TOC Transition Note (Signed)
Transition of Care [facility]) - CM/SW Discharge Note   Patient Details  Name: HAN VEJAR MRN: [id number] Date of Birth: 12-23-1940  Transition of Care [facility]) CM/SW Contact:  Angelita Ingles, RN Phone Number: 08/23/2019, 1:17 PM   Clinical Narrative: Patient to be discharged home under the care of  wife. DME has been previously set up ( crutches, wheelchair, transport chair walker) verified per wife. Wife currently at the bedside and states that she will be transporting patient home via private vehicle. No further needs at this time.      Final next level of care: Home/Self Care(wife to care for patient) Barriers to Discharge: No Barriers Identified   Patient Goals and CMS Choice        Discharge Placement                       Discharge Plan and Services In-house Referral: Clinical Social Work   Post Acute Care Choice: Home Health          DME Arranged: N/A(pt has crutches, wheelchair, walker and transport chair at home per wife)         HH Arranged: (Active with [facility]) [place]: [facility] (formerly [facility])(active with this agency prior to and after discharge) Date [place]: 08/23/19(Tiffany made aware of patient d/c and services to continue) Time [place]: [place] Representative spoke with at [place]: Tiffany(message sent to [employer])  Social Determinants of Health ([place]) Interventions     Readmission Risk Interventions No flowsheet data found.

## 2019-08-23 NOTE — Progress Notes (Signed)
PHARMACY - PHYSICIAN COMMUNICATION CRITICAL VALUE ALERT - BLOOD CULTURE IDENTIFICATION (BCID)  Stephen [name] is an 79 y.o. male who presented to [facility] on 08/19/2019 with a chief complaint of weakness, fatigue, and falls.  Assessment:  Started on Rocephin for suspected UTI/pyelo which was then r/o, now w/ 1/4 bottles growing GPR, likely contaminant.  Name of physician (or Provider) Contacted: MDenny  Current antibiotics: None  Changes to prescribed antibiotics recommended:  No changes needed at this time.   Wynona Neat, PharmD, BCPS  08/23/2019  2:53 AM

## 2019-08-23 NOTE — Progress Notes (Signed)
Discharge instructions provided to patient and his wife, all questions were answered and understanding was verbalized. IV's were removed, telemetry was discontinued.

## 2019-08-23 NOTE — Discharge Summary (Signed)
Physician Discharge Summary  Stephen [name] HWE:[id number] DOB: [birth year] DOA: 08/19/2019  PCP: [name], MD  Admit date: 08/19/2019 Discharge date: 08/23/2019  Admitted From: Home Disposition: Home  Recommendations for Outpatient Follow-up:  1. Follow ups as below. 2. Please obtain CBC/BMP/Mag at follow up 3. Please follow up on the following pending results: None  Home Health: PT/OT Equipment/Devices: Patient has a wheelchair and transportation chair at home.  Discharge Condition: Stable CODE STATUS: Full code  Follow-up Information    [name], MD. Schedule an appointment as soon as possible for a visit in 1 week(s).   Specialty: Family Medicine Contact information: [place] [address] [phone]        Troy Sine, MD. Schedule an appointment as soon as possible for a visit in 2 week(s).   Specialty: Cardiology Contact information: [address] [place] [address] [phone]           Hospital Course: 79 year old male with history of CAD/stents, ICM (30 to 35%), liver cirrhosis due to hep C, portal hypertensive gastropathy, CKD-3A, AAA with endoleak s/p repair in 4/19 and 5/19 and HTN who was directed to come to ED by PCP for AKI.  Heart weakness, fatigue and recurrent falls for few weeks that prompted him to go to PCP.  In ED, HDS.  Hgb 9.1 (baseline). Cr 3.5 (b/l 1.5).  BUN 75.  K5.9.  Bicarb 16.  UA with moderate LE and rare bacteria.  COVID-19 negative.  CT abdomen and pelvis revealed significant interval enlargement of infrarenal AAA and right iliac artery aneurysm with possible hemorrhagic product or thrombus within aneurysm sac, interval enlargement of left parapelvic cyst and possible mild left hydronephrosis without hydroureter or obvious stone and left greater than right perinephric fat stranding and fluid.  Vascular surgery consulted.  Patient was started on IV fluid and IV  ceftriaxone and admitted for AKI on CKD-3a and possible pyelonephritis.   AKI improving with IV fluid.  Aldactone and ramipril discontinued. Pyelonephritis ruled out.  Antibiotics discontinued. Patient had episode of diarrhea.  C. difficile negative.  Diarrhea resolved.  Vascular surgery to arrange outpatient follow-up for evaluation of AAA once renal function back to baseline.  Increased home Coreg for better HR and BP control.  Patient was evaluated by PT/OT who recommended home health PT/OT and wheelchair.  See individual problem list below for more hospital course.  Discharge Diagnoses:  AKI on CKD-3A: Baseline Cr ~1.5 > 3.5 (on 3/16) > 3.51 (admit) >> 2.91> 2.39.  BUN in 70> 47>41.  Suspect prerenal with azotemia.  Not sure if AAA would play a role in this.  There is also left-sided mild hydronephrosis without hydroureter or renal stone.  He is also on low-dose ramipril and Aldactone. Denies NSAID use.  Overall, renal function improved with IV fluids, and holding Aldactone and ramipril. -Discontinued Aldactone and ramipril -Recheck renal function in about a week  AAA with endoleak status post repairs x2-CT abdomen and pelvis concerning for significant extension (about 1 cm enlargement over the last 1 year). -Vascular surgery to arrange outpatient follow-up for further evaluation once renal function back to baseline. -Increased carvedilol to 25 mg twice daily for better HR and blood pressure control. -Continue aspirin and statin  Systolic CHF/ICM: Echo with EF of 35% (unchanged), with regional akinesis, G1 DD.  No cardiopulmonary symptoms.  BNP 271.  CXR without acute finding.  Appears hypovolemic.  Not on diuretics other than low-dose Aldactone at  home.  -Discontinue Aldactone and ramipril in the setting of AKI -Increased Coreg as above. -Liberated fluid intake in the setting of AKI. -Outpatient follow-up with cardiology in about 2 weeks  CAD s/p DES to LAD in 2013 stents: No anginal  symptoms. -Continue Coreg, statin and aspirin.  Non-anion gap metabolic acidosis: Likely due to AKI.  Improved. -Continue sodium bicarb 650 mg twice daily  Hyperkalemia: Resolved. -Continue monitoring  Pyelonephritis ruled out.  Clinical picture, urinalysis and urine culture not convincing. -Ceftriaxone discontinued.  Anemia of renal disease: Baseline Hgb~11> 9.2 (3/16)> 9.1 (admit)> 8.9> 7.1> 8.0> 8.1.  Anemia panel consistent with anemia of chronic disease.  Patient denies melena or hematochezia.  -Recheck CBC at follow-up.  Diarrhea: C. difficile negative.  Diarrhea resolved.  History of hepatitis C with cirrhosis: Treated? -Outpatient follow-up  Moderate malnutrition:Body mass index is 20.18 kg/m. -Continue Ensure and multivitamins  Debility/recurrent fall/history of right AKA -Home health PT/OT -Patient has wheelchair and transportation chair at home.  Family communication-discussed with patient's wife on the day of discharge.  Discharge Instructions  Discharge Instructions    (HEART FAILURE PATIENTS) Call MD:  Anytime you have any of the following symptoms: 1) 3 pound weight gain in 24 hours or 5 pounds in 1 week 2) shortness of breath, with or without a dry hacking cough 3) swelling in the hands, feet or stomach 4) if you have to sleep on extra pillows at night in order to breathe.   Complete by: As directed    Call MD for:  difficulty breathing, headache or visual disturbances   Complete by: As directed    Call MD for:  extreme fatigue   Complete by: As directed    Call MD for:  persistant dizziness or light-headedness   Complete by: As directed    Call MD for:  persistant nausea and vomiting   Complete by: As directed    Call MD for:  severe uncontrolled pain   Complete by: As directed    Call MD for:  temperature >100.4   Complete by: As directed    Diet - low sodium heart healthy   Complete by: As directed    Discharge instructions   Complete by: As  directed    It has been a pleasure taking care of you! You were hospitalized with acute kidney injury and enlarged abdominal aortic aneurysm.  Your kidney injury improved with intravenous fluid and adjustment of your medications but not quite back to your baseline.  So we made adjustments to your home medications to allow your kidney to recover.  In regards to your aortic aneurysm, vascular surgery to arrange outpatient follow-up for further evaluation and treatment. Please review your new medication list and the directions before you take your medications.  Please follow-up with your primary care doctor in 1 week to have your kidney number and blood level checked.  Please avoid any over-the-counter pain medications other than plain Tylenol.   Take care,   Increase activity slowly   Complete by: As directed      Allergies as of 08/23/2019   No Known Allergies     Medication List    STOP taking these medications   ramipril 10 MG capsule Commonly known as: ALTACE   spironolactone 25 MG tablet Commonly known as: ALDACTONE     TAKE these medications   aspirin EC 81 MG tablet Take 81 mg by mouth daily after supper.   atorvastatin 40 MG tablet Commonly known as: LIPITOR TAKE  1 TABLET BY MOUTH EVERY DAY What changed: when to take this   carvedilol 25 MG tablet Commonly known as: Coreg Take 1 tablet (25 mg total) by mouth 2 (two) times daily. What changed:   medication strength  how much to take  when to take this   ezetimibe 10 MG tablet Commonly known as: ZETIA TAKE 1 TABLET BY MOUTH EVERY DAY What changed: when to take this   feeding supplement (ENSURE ENLIVE) Liqd Take 237 mLs by mouth 3 (three) times daily between meals.   multivitamin with minerals Tabs tablet Take 1 tablet by mouth daily with breakfast.   nitroGLYCERIN 0.4 MG SL tablet Commonly known as: NITROSTAT Place 1 tablet (0.4 mg total) under the tongue every 5 (five) minutes as needed for chest  pain.   sodium bicarbonate 650 MG tablet Take 1 tablet (650 mg total) by mouth 2 (two) times daily.            Durable Medical Equipment  (From admission, onward)         Start     Ordered   08/23/19 1159  For home use only DME high strength lightweight manual wheelchair with seat cushion  Once    Comments: Patient suffers from right AKA, debility, aortic aneurysm and kidney injury which would increase your risk of fall and significant adverse outcome.  A cane, crutch or walker will not resolve  issue with performing activities of daily living. A wheelchair will allow patient to safely perform daily activities.Length of need Lifetime. (THEN ONE OF THESE TWO:) Patient self-propels the wheelchair while engaging in frequent activities such as laundry, meals and toileting which cannot be performed in a standard or lightweight wheelchair due to the weight of the chair. Accessories: elevating leg rests (ELRs), wheel locks, extensions and anti-tippers.   08/23/19 1201          Consultations:  Vascular surgery  Procedures/Studies:  2D Echo on 08/21/2019 1. LVEF is approxiamtely 35% with akinesis of the anteroseptum,  mid/distal inferoseptum, distal inferior and apical walls. . Left  ventricular ejection fraction, by estimation, is 35%%. The left ventricle  has mildly decreased function. The left ventricle  demonstrates regional wall motion abnormalities (see scoring  diagram/findings for description). The left ventricular internal cavity  size was moderately dilated. Left ventricular diastolic parameters are  consistent with Grade I diastolic dysfunction  (impaired relaxation).  2. Right ventricular systolic function is normal. The right ventricular  size is normal.  3. Left atrial size was mildly dilated.  4. The mitral valve is grossly normal. Mild mitral valve regurgitation.  5. The aortic valve is abnormal. Aortic valve regurgitation is trivial.  Mild aortic valve  sclerosis is present, with no evidence of aortic valve  stenosis.  6. The inferior vena cava is normal in size with greater than 50%  respiratory variability, suggesting right atrial pressure of 3 mmHg.    CT ABDOMEN PELVIS WO CONTRAST  Result Date: 08/19/2019 CLINICAL DATA:  Anemia malnutrition EXAM: CT ABDOMEN AND PELVIS WITHOUT CONTRAST TECHNIQUE: Multidetector CT imaging of the abdomen and pelvis was performed following the standard protocol without IV contrast. COMPARISON:  CT 06/10/2018, 12/07/2017, 10/12/2017 FINDINGS: Lower chest: Lung bases demonstrate emphysematous disease. No acute consolidation or pleural effusion. Coronary vascular calcification. Hepatobiliary: No focal liver abnormality is seen. No gallstones, gallbladder wall thickening, or biliary dilatation. Pancreas: Unremarkable. No pancreatic ductal dilatation or surrounding inflammatory changes. Spleen: Normal in size without focal abnormality. Adrenals/Urinary Tract: Adrenal glands are within  normal limits. Interval increase in size of left parapelvic cyst, now measuring 5.6 cm, compared with 4.5 cm previously. Suspect development of hydronephrosis. Renal pelvic calcifications are likely vascular. The left ureter does not appear dilated. No definitive ureteral stone. There is increased perinephric stranding and a small amount of left greater than right perinephric fluid. Mass effect on the left posterior aspect of the bladder from the prostate gland. Stomach/Bowel: Stomach nonenlarged. No dilated small bowel. No bowel wall thickening. Negative appendix. Extensive sigmoid colon diverticula without acute inflammatory process. Vascular/Lymphatic: Status post endovascular repair of infrarenal abdominal aortic aneurysm with graft extending to the left iliac artery. Interim enlargement of the aneurysm sac surrounding the graph, sample measurement of 7.3 cm series 3, image number 50, compared with 6.2 cm on prior exam, similar location. Right  iliac artery aneurysm also appears enlarged measuring up to 5.8 cm as compared with 4.9 cm, series 3, image number 59. Hyperdense thrombus/blood product is evident within the aneurysm sac. No retroperitoneal hematoma is evident. No soft tissue stranding to suggest leakage at this time. No significantly enlarged lymph nodes. Reproductive: Enlarged prostate with mass effect on the bladder Other: Negative for free air or free fluid. Musculoskeletal: Degenerative changes. No acute or suspicious osseous abnormality. IMPRESSION: 1. Status post previous endovascular repair of infrarenal abdominal aortic aneurysm with extension of graft material to the left iliac artery. Significant interval enlargement of the residual aneurysm sac surrounding the graft, in addition to interval enlargement of right iliac artery aneurysm since the 06/10/2018 CT, with evidence of hyperdense hemorrhagic product or thrombus within the aneurysm sacs. Negative for retroperitoneal hematoma at this time. Vascular surgery consultation/follow-up is recommended. 2. Interval enlargement of previously noted left parapelvic cyst. Suspicion of development of mild left hydronephrosis though without hydroureter or obvious stone disease. Left greater than right perinephric fat stranding and fluid. 3. Extensive sigmoid colon diverticular disease without acute inflammatory process. Electronically Signed   By: Donavan Foil M.D.   On: 08/19/2019 22:28   DG Lumbar Spine Complete  Result Date: 08/16/2019 CLINICAL DATA:  Back pain. EXAM: LUMBAR SPINE - COMPLETE 4+ VIEW COMPARISON:  01/03/2014 FINDINGS: The patient is status post prior aortic endograft placement. There are degenerative changes of the lumbar spine. There is no acute compression fracture. No malalignment. Degenerative changes are noted at the L4-L5 and L5-S1 levels. Facet arthrosis is noted at the lower lumbar segments. IMPRESSION: Degenerative changes of the lumbar spine. No acute compression  fracture or malalignment. Electronically Signed   By: Constance Holster M.D.   On: 08/16/2019 21:58   DG Chest Port 1 View  Result Date: 08/20/2019 CLINICAL DATA:  Shortness of breath. EXAM: PORTABLE CHEST 1 VIEW COMPARISON:  September 11, 2017. FINDINGS: The heart size and mediastinal contours are within normal limits. Both lungs are clear. No pneumothorax or pleural effusion is noted. The visualized skeletal structures are unremarkable. IMPRESSION: No active disease. Electronically Signed   By: Marijo Conception M.D.   On: 08/20/2019 10:40   ECHOCARDIOGRAM COMPLETE  Result Date: 08/21/2019    ECHOCARDIOGRAM REPORT   Patient Name:   LEEVON UPPERMAN Date of Exam: 08/21/2019 Medical Rec #:  [id number]        Height:       68.0 in Accession #:    [id number]       Weight:       130.3 lb Date of Birth:  14-Oct-1940       BSA:  1.703 m Patient Age:    30 years         BP:           136/64 mmHg Patient Gender: M                HR:           71 bpm. Exam Location:  Inpatient Procedure: 2D Echo, Cardiac Doppler and Color Doppler Indications:    Cardiomyopathy-Ischemic 414.8 / I25.5  History:        Patient has prior history of Echocardiogram examinations, most                 recent 12/21/2017. Cardiomyopathy, Previous Myocardial                 Infarction; Risk Factors:Hypertension, Dyslipidemia and Tobacco                 use. S/P coronary artery stent placement, 4 DES to LAD. 09/21/11.  Sonographer:    Alvino Chapel RCS Referring Phys: [id number] Charlesetta Ivory Cristopher Ciccarelli IMPRESSIONS  1. LVEF is approxiamtely 35% with akinesis of the anteroseptum, mid/distal inferoseptum, distal inferior and apical walls. . Left ventricular ejection fraction, by estimation, is 35%%. The left ventricle has mildly decreased function. The left ventricle  demonstrates regional wall motion abnormalities (see scoring diagram/findings for description). The left ventricular internal cavity size was moderately dilated. Left ventricular diastolic  parameters are consistent with Grade I diastolic dysfunction (impaired relaxation).  2. Right ventricular systolic function is normal. The right ventricular size is normal.  3. Left atrial size was mildly dilated.  4. The mitral valve is grossly normal. Mild mitral valve regurgitation.  5. The aortic valve is abnormal. Aortic valve regurgitation is trivial. Mild aortic valve sclerosis is present, with no evidence of aortic valve stenosis.  6. The inferior vena cava is normal in size with greater than 50% respiratory variability, suggesting right atrial pressure of 3 mmHg. FINDINGS  Left Ventricle: LVEF is approxiamtely 35% with akinesis of the anteroseptum, mid/distal inferoseptum, distal inferior and apical walls. Left ventricular ejection fraction, by estimation, is 35%%. The left ventricle has mildly decreased function. The left ventricle demonstrates regional wall motion abnormalities. Definity contrast agent was given IV to delineate the left ventricular endocardial borders. The left ventricular internal cavity size was moderately dilated. There is no left ventricular hypertrophy. Left ventricular diastolic parameters are consistent with Grade I diastolic dysfunction (impaired relaxation). Right Ventricle: The right ventricular size is normal. No increase in right ventricular wall thickness. Right ventricular systolic function is normal. Left Atrium: Left atrial size was mildly dilated. Right Atrium: Right atrial size was normal in size. Pericardium: There is no evidence of pericardial effusion. Mitral Valve: The mitral valve is grossly normal. Mild mitral valve regurgitation. Tricuspid Valve: The tricuspid valve is normal in structure. Tricuspid valve regurgitation is not demonstrated. Aortic Valve: The aortic valve is abnormal. Aortic valve regurgitation is trivial. Mild aortic valve sclerosis is present, with no evidence of aortic valve stenosis. Pulmonic Valve: The pulmonic valve was not well visualized.  Pulmonic valve regurgitation is not visualized. Aorta: The aortic root is normal in size and structure. Venous: The inferior vena cava is normal in size with greater than 50% respiratory variability, suggesting right atrial pressure of 3 mmHg. IAS/Shunts: No atrial level shunt detected by color flow Doppler.  LEFT VENTRICLE PLAX 2D LVIDd:         5.70 cm  Diastology LVIDs:  4.80 cm  LV e' lateral:   4.57 cm/s LV PW:         1.00 cm  LV E/e' lateral: 10.9 LV IVS:        0.70 cm  LV e' medial:    4.90 cm/s LVOT diam:     2.10 cm  LV E/e' medial:  10.2 LV SV:         55 LV SV Index:   33 LVOT Area:     3.46 cm  RIGHT VENTRICLE RV S prime:     11.10 cm/s TAPSE (M-mode): 1.8 cm LEFT ATRIUM             Index       RIGHT ATRIUM           Index LA diam:        3.30 cm 1.94 cm/m  RA Area:     15.00 cm LA Vol (A2C):   69.2 ml 40.62 ml/m RA Volume:   40.70 ml  23.89 ml/m LA Vol (A4C):   60.6 ml 35.57 ml/m LA Biplane Vol: 64.6 ml 37.92 ml/m  AORTIC VALVE LVOT Vmax:   78.90 cm/s LVOT Vmean:  53.700 cm/s LVOT VTI:    0.160 m  AORTA Ao Root diam: 3.50 cm MITRAL VALVE MV Area (PHT): 2.45 cm    SHUNTS MV Decel Time: 310 msec    Systemic VTI:  0.16 m MV E velocity: 49.90 cm/s  Systemic Diam: 2.10 cm MV A velocity: 91.70 cm/s MV E/A ratio:  0.54 Dorris Carnes MD Electronically signed by Dorris Carnes MD Signature Date/Time: 08/21/2019/5:28:15 PM    Final        Discharge Exam: Vitals:   08/22/19 2139 08/23/19 0809  BP: 133/62 126/65  Pulse: 79 78  Resp:    Temp: 99.2 F (37.3 C) 99.5 F (37.5 C)  SpO2: 97% 96%    GENERAL: No acute distress.  Appears well.  HEENT: MMM.  Vision and hearing grossly intact.  NECK: Supple.  No apparent JVD.  RESP: On room air.  No IWOB. Good air movement bilaterally. CVS:  RRR. Heart sounds normal.  ABD/GI/GU: Bowel sounds present. Soft. Non tender.  MSK/EXT: Right AKA.  No edema in LLE. SKIN: no apparent skin lesion or wound NEURO: Awake, alert and oriented appropriately.   No apparent focal neuro deficit. PSYCH: Calm. Normal affect.   The results of significant diagnostics from this hospitalization (including imaging, microbiology, ancillary and laboratory) are listed below for reference.     Microbiology: Recent Results (from the past 240 hour(s))  SARS CORONAVIRUS 2 (TAT 6-24 HRS) Nasopharyngeal Nasopharyngeal Swab     Status: None   Collection Time: 08/19/19 11:13 PM   Specimen: Nasopharyngeal Swab  Result Value Ref Range Status   SARS Coronavirus 2 NEGATIVE NEGATIVE Final    Comment: (NOTE) SARS-CoV-2 target nucleic acids are NOT DETECTED. The SARS-CoV-2 RNA is generally detectable in upper and lower respiratory specimens during the acute phase of infection. Negative results do not preclude SARS-CoV-2 infection, do not rule out co-infections with other pathogens, and should not be used as the sole basis for treatment or other patient management decisions. Negative results must be combined with clinical observations, patient history, and epidemiological information. The expected result is Negative. Fact Sheet for Patients: SugarRoll.be Fact Sheet for Healthcare Providers: https://www.woods-mathews.com/ This test is not yet approved or cleared by the [place] FDA and  has been authorized for detection and/or diagnosis of SARS-CoV-2 by FDA under  an Emergency Use Authorization (EUA). This EUA will remain  in effect (meaning this test can be used) for the duration of the COVID-19 declaration under Section 56 4(b)(1) of the Act, 21 U.S.C. section 360bbb-3(b)(1), unless the authorization is terminated or revoked sooner. Performed at [facility], [place] [address], [place], [place] [address]   Culture, blood (routine x 2)     Status: None (Preliminary result)   Collection Time: 08/20/19  4:28 AM   Specimen: BLOOD RIGHT WRIST  Result Value Ref Range Status   Specimen Description BLOOD RIGHT WRIST   Final   Special Requests   Final    BOTTLES DRAWN AEROBIC AND ANAEROBIC Blood Culture adequate volume   Culture  Setup Time   Final    AEROBIC BOTTLE ONLY GRAM POSITIVE RODS CRITICAL RESULT CALLED TO, READ BACK BY AND VERIFIED WITHAlona Bene [facility] 08/23/19 0249 JDW Performed at [facility], [place] [address] [place], [place] [address]    Culture NO GROWTH 3 DAYS  Final   Report Status PENDING  Incomplete  Culture, blood (routine x 2)     Status: None (Preliminary result)   Collection Time: 08/20/19  4:50 AM   Specimen: BLOOD LEFT FOREARM  Result Value Ref Range Status   Specimen Description BLOOD LEFT FOREARM  Final   Special Requests   Final    BOTTLES DRAWN AEROBIC AND ANAEROBIC Blood Culture adequate volume Performed at [facility], [place] [address], [place], [place] [address]    Culture NO GROWTH 3 DAYS  Final   Report Status PENDING  Incomplete  Culture, Urine     Status: Abnormal   Collection Time: 08/20/19  4:06 PM   Specimen: Urine, Clean Catch  Result Value Ref Range Status   Specimen Description URINE, CLEAN CATCH  Final   Special Requests   Final    NONE Performed at [facility], [place] [address], [place], [place] [address]    Culture <10,000 COLONIES/mL INSIGNIFICANT GROWTH (A)  Final   Report Status 08/21/2019 FINAL  Final  C difficile quick scan w PCR reflex     Status: None   Collection Time: 08/20/19  4:58 PM   Specimen: STOOL  Result Value Ref Range Status   C Diff antigen NEGATIVE NEGATIVE Final   C Diff toxin NEGATIVE NEGATIVE Final   C Diff interpretation No C. difficile detected.  Final    Comment: Performed at [facility], [place] [address], [place], [place] [address]     Labs: BNP (last 3 results) Recent Labs    08/20/19 0152  BNP 564.3*   Basic Metabolic Panel: Recent Labs  Lab 08/20/19 0330 08/20/19 1117 08/21/19 0313 08/22/19 0745 08/23/19 0310  NA 135 144 142 144 141  K 6.1* 4.8 4.8 4.9 4.9  CL 112* 116* 114* 114*  118*  CO2 13* 17* 17* 18* 17*  GLUCOSE 115* 136* 126* 138* 104*  BUN 75* 72* 63* 47* 41*  CREATININE 3.36* 3.43* 2.91* 2.48* 2.39*  CALCIUM 8.2* 8.4* 7.8* 7.9* 7.8*  MG  --   --  2.1 1.9 1.8  PHOS  --   --  3.6 2.9 2.8   Liver Function Tests: Recent Labs  Lab 08/16/19 1215 08/19/19 2144 08/21/19 0313 08/22/19 0745 08/23/19 0310  AST 113* 64*  --   --   --   ALT 117* 80*  --   --   --   ALKPHOS 104 98  --   --   --  BILITOT 0.7 0.7  --   --   --   PROT 5.6* 6.1*  --   --   --   ALBUMIN 2.7* 2.1* 1.7* 1.6* 1.7*   No results for input(s): LIPASE, AMYLASE in the last 168 hours. No results for input(s): AMMONIA in the last 168 hours. CBC: Recent Labs  Lab 08/16/19 1215 08/16/19 1215 08/19/19 1924 08/19/19 1924 08/19/19 2144 08/21/19 0313 08/21/19 1141 08/22/19 0745 08/23/19 0310  WBC 9.9   < > 10.7*  --  9.7 6.9  --  8.2 10.6*  NEUTROABS 8.6*  --   --   --  8.6*  --   --   --   --   HGB 9.2*   < > 9.1*   < > 8.9* 7.1* 7.4* 8.0* 8.1*  HCT 26.6*   < > 28.3*   < > 28.0* 22.9* 22.6* 25.2* 25.4*  MCV 99.7   < > 104.4*  --  102.2* 106.0*  --  103.7* 103.3*  PLT 228.0   < > 321  --  309 258  --  289 278   < > = values in this interval not displayed.   Cardiac Enzymes: No results for input(s): CKTOTAL, CKMB, CKMBINDEX, TROPONINI in the last 168 hours. BNP: Invalid input(s): POCBNP CBG: No results for input(s): GLUCAP in the last 168 hours. D-Dimer No results for input(s): DDIMER in the last 72 hours. Hgb A1c No results for input(s): HGBA1C in the last 72 hours. Lipid Profile No results for input(s): CHOL, HDL, LDLCALC, TRIG, CHOLHDL, LDLDIRECT in the last 72 hours. Thyroid function studies No results for input(s): TSH, T4TOTAL, T3FREE, THYROIDAB in the last 72 hours.  Invalid input(s): FREET3 Anemia work up No results for input(s): VITAMINB12, FOLATE, FERRITIN, TIBC, IRON, RETICCTPCT in the last 72 hours. Urinalysis    Component Value Date/Time   COLORURINE  YELLOW 08/19/2019 1922   APPEARANCEUR HAZY (A) 08/19/2019 1922   LABSPEC 1.015 08/19/2019 1922   PHURINE 5.0 08/19/2019 1922   GLUCOSEU NEGATIVE 08/19/2019 1922   HGBUR NEGATIVE 08/19/2019 1922   BILIRUBINUR NEGATIVE 08/19/2019 1922   KETONESUR NEGATIVE 08/19/2019 1922   PROTEINUR 30 (A) 08/19/2019 1922   UROBILINOGEN 1.0 09/26/2011 0850   NITRITE NEGATIVE 08/19/2019 1922   LEUKOCYTESUR MODERATE (A) 08/19/2019 1922   Sepsis Labs Invalid input(s): PROCALCITONIN,  WBC,  LACTICIDVEN   Time coordinating discharge: 40 minutes  SIGNED:  Mercy Riding, MD  [facility] 08/23/2019, 12:07 PM  If 7PM-7AM, please contact night-coverage [email or web address] Password [email or web address]

## 2019-08-24 ENCOUNTER — Telehealth: Payer: Self-pay | Admitting: *Deleted

## 2019-08-24 DIAGNOSIS — N1831 Chronic kidney disease, stage 3a: Secondary | ICD-10-CM | POA: Diagnosis not present

## 2019-08-24 DIAGNOSIS — M545 Low back pain: Secondary | ICD-10-CM | POA: Diagnosis not present

## 2019-08-24 DIAGNOSIS — I130 Hypertensive heart and chronic kidney disease with heart failure and stage 1 through stage 4 chronic kidney disease, or unspecified chronic kidney disease: Secondary | ICD-10-CM | POA: Diagnosis not present

## 2019-08-24 DIAGNOSIS — D631 Anemia in chronic kidney disease: Secondary | ICD-10-CM | POA: Diagnosis not present

## 2019-08-24 DIAGNOSIS — N1330 Unspecified hydronephrosis: Secondary | ICD-10-CM | POA: Diagnosis not present

## 2019-08-24 DIAGNOSIS — I080 Rheumatic disorders of both mitral and aortic valves: Secondary | ICD-10-CM | POA: Diagnosis not present

## 2019-08-24 DIAGNOSIS — K7460 Unspecified cirrhosis of liver: Secondary | ICD-10-CM | POA: Diagnosis not present

## 2019-08-24 DIAGNOSIS — Z89611 Acquired absence of right leg above knee: Secondary | ICD-10-CM | POA: Diagnosis not present

## 2019-08-24 DIAGNOSIS — E785 Hyperlipidemia, unspecified: Secondary | ICD-10-CM | POA: Diagnosis not present

## 2019-08-24 DIAGNOSIS — Z9181 History of falling: Secondary | ICD-10-CM | POA: Diagnosis not present

## 2019-08-24 DIAGNOSIS — D539 Nutritional anemia, unspecified: Secondary | ICD-10-CM | POA: Diagnosis not present

## 2019-08-24 DIAGNOSIS — I5020 Unspecified systolic (congestive) heart failure: Secondary | ICD-10-CM | POA: Diagnosis not present

## 2019-08-24 DIAGNOSIS — E440 Moderate protein-calorie malnutrition: Secondary | ICD-10-CM | POA: Diagnosis not present

## 2019-08-24 DIAGNOSIS — Z955 Presence of coronary angioplasty implant and graft: Secondary | ICD-10-CM | POA: Diagnosis not present

## 2019-08-24 DIAGNOSIS — Z720 Tobacco use: Secondary | ICD-10-CM | POA: Diagnosis not present

## 2019-08-24 DIAGNOSIS — Z7982 Long term (current) use of aspirin: Secondary | ICD-10-CM | POA: Diagnosis not present

## 2019-08-24 DIAGNOSIS — D696 Thrombocytopenia, unspecified: Secondary | ICD-10-CM | POA: Diagnosis not present

## 2019-08-24 DIAGNOSIS — I2510 Atherosclerotic heart disease of native coronary artery without angina pectoris: Secondary | ICD-10-CM | POA: Diagnosis not present

## 2019-08-24 DIAGNOSIS — I252 Old myocardial infarction: Secondary | ICD-10-CM | POA: Diagnosis not present

## 2019-08-24 DIAGNOSIS — B182 Chronic viral hepatitis C: Secondary | ICD-10-CM | POA: Diagnosis not present

## 2019-08-24 DIAGNOSIS — K5790 Diverticulosis of intestine, part unspecified, without perforation or abscess without bleeding: Secondary | ICD-10-CM | POA: Diagnosis not present

## 2019-08-24 DIAGNOSIS — I255 Ischemic cardiomyopathy: Secondary | ICD-10-CM | POA: Diagnosis not present

## 2019-08-24 DIAGNOSIS — K3189 Other diseases of stomach and duodenum: Secondary | ICD-10-CM | POA: Diagnosis not present

## 2019-08-24 NOTE — Telephone Encounter (Signed)
Agree with OT evaluation

## 2019-08-24 NOTE — Telephone Encounter (Signed)
Stephen [name] with [facility] stated that she did a nursing evaluation on patient and this will be the only one he will need. Stephen [name] stated that the  patient does need an OT evaluation because he has had a significant change in his strength. Stephen [name] is requesting a verbal order for OT evaluation.

## 2019-08-25 ENCOUNTER — Telehealth: Payer: Self-pay

## 2019-08-25 ENCOUNTER — Other Ambulatory Visit: Payer: Self-pay

## 2019-08-25 DIAGNOSIS — Z48812 Encounter for surgical aftercare following surgery on the circulatory system: Secondary | ICD-10-CM

## 2019-08-25 DIAGNOSIS — D631 Anemia in chronic kidney disease: Secondary | ICD-10-CM | POA: Diagnosis not present

## 2019-08-25 DIAGNOSIS — I5020 Unspecified systolic (congestive) heart failure: Secondary | ICD-10-CM | POA: Diagnosis not present

## 2019-08-25 DIAGNOSIS — E440 Moderate protein-calorie malnutrition: Secondary | ICD-10-CM | POA: Diagnosis not present

## 2019-08-25 DIAGNOSIS — I130 Hypertensive heart and chronic kidney disease with heart failure and stage 1 through stage 4 chronic kidney disease, or unspecified chronic kidney disease: Secondary | ICD-10-CM | POA: Diagnosis not present

## 2019-08-25 DIAGNOSIS — I2510 Atherosclerotic heart disease of native coronary artery without angina pectoris: Secondary | ICD-10-CM | POA: Diagnosis not present

## 2019-08-25 DIAGNOSIS — N1831 Chronic kidney disease, stage 3a: Secondary | ICD-10-CM | POA: Diagnosis not present

## 2019-08-25 LAB — CULTURE, BLOOD (ROUTINE X 2)
Culture: NO GROWTH
Special Requests: ADEQUATE
Special Requests: ADEQUATE

## 2019-08-25 NOTE — Telephone Encounter (Signed)
Stephen [name] advised on her voicemail

## 2019-08-25 NOTE — Telephone Encounter (Signed)
Cara advised

## 2019-08-25 NOTE — Telephone Encounter (Signed)
Agree with this. Thanks.  

## 2019-08-25 NOTE — Telephone Encounter (Signed)
[place] with [facility] left v/m requesting verbal orders for [facility] PT 1 x a wk for 1 wk; 2 x a wk for 1 wk and 1 x a wk for 7 wks.

## 2019-08-25 NOTE — Telephone Encounter (Signed)
Transition Care Management Follow-up Telephone Call  Date of discharge and from where: 08/23/2019, Stephen [name]  How have you been since you were released from the hospital? Patient is doing okay. Wife (HIPAA verified) states that patient is having a hard time getting around and physical therapy is coming today to start helping with that.   Any questions or concerns? No   Items Reviewed:  Did the pt receive and understand the discharge instructions provided? Yes   Medications obtained and verified? Yes   Any new allergies since your discharge? No   Dietary orders reviewed? Yes  Do you have support at home? Yes   Functional Questionnaire: (I = Independent and D = Dependent) ADLs: D  Bathing/Dressing- D  Meal Prep- D  Eating- I  Maintaining continence- I  Transferring/Ambulation- D  Managing Meds- I  Follow up appointments reviewed:   PCP Hospital f/u appt confirmed? Yes  Scheduled to see 08/30/2019 on Dr. Danise Mina @ 9:30 am.  [facility] f/u appt confirmed? Yes  Scheduled to see cardiology  Are transportation arrangements needed? No   If their condition worsens, is the pt aware to call PCP or go to the Emergency Dept.? Yes  Was the patient provided with contact information for the PCP's office or ED? Yes  Was to pt encouraged to call back with questions or concerns? Yes

## 2019-08-28 NOTE — Progress Notes (Signed)
This visit was conducted in person.  BP 136/62 (BP Location: Left Arm, Patient Position: Sitting, Cuff Size: Normal)   Pulse 63   Temp 97.6 F (36.4 C) (Temporal)   Ht 5\' 7"  (1.702 m)   Wt 126 lb 7 oz (57.4 kg)   SpO2 96%   BMI 19.80 kg/m    CC: hosp f/u visit Subjective:    Patient ID: Stephen [name], male    DOB: 13-Dec-1940, 79 y.o.   MRN: [id number]  HPI: Stephen [name] is a 79 y.o. male presenting on 08/30/2019 for Hospitalization Follow-up (Pt accompanied by wife, Santiago Glad- temp 97.8.)   Seen in office 08/16/2019 with several weeks of progressive weakness, malaise, failure to thrive, weight loss. Labwork revealed acute renal failure with progressive anemia - sent to ER where he was admitted with acute kidney failure with mild L hydronephrosis and L>R perinephric fat stranding ?pyelonephritis - treated with IVF and IV ceftriaxone. also found to have interval enlargement of infrarenal AAA and R iliac artery aneurysm with possible hemorrhagic product/thrombus in aneurysm sac. Tested COVID negative. Pyelonephritis ruled out so antibiotics were stopped. 1/2 blood cultures grew Corynebacterium amycolatum ?contaminant. iFOB and Cdiff negative. UCx insignificant growth.   H/o AAA s/p repairs x2 with endoleak - planned outpatient VVS f/u - scheduled for end of 09/2019 with rpt CT prior. Carvedilol was increased to 25mg  bid. Discharged on sodium bicarb 650mg  bid. Planned f/u with cardiology for next week.   Ramipril and spironolactone were held on discharge.   No fevers/chills since home. Feeling better since home but persistent fatigue. Each day seems to be improving. Appetite slowly improving - continues ensure 2-3 times a day.   [facility] PT and OT currently involved.  He has transport chair at home.  Prior weight of 130lbs was with leg prosthesis.  6 lb weight gain since last visit.    Admit date: 08/19/2019 Discharge date: 08/23/2019 TCM hospital f/u phone call completed on  08/25/2019  Admitted From: Home Disposition: Home  Recommendations for Outpatient Follow-up:  1. Follow ups as below. 2. Please obtain CBC/BMP/Mag at follow up 3. Please follow up on the following pending results: None  Home Health: PT/OT Equipment/Devices: Patient has a wheelchair and transportation chair at home.  Discharge Condition: Stable CODE STATUS: Full code     Relevant past medical, surgical, family and social history reviewed and updated as indicated. Interim medical history since our last visit reviewed. Allergies and medications reviewed and updated. Outpatient Medications Prior to Visit  Medication Sig Dispense Refill  . aspirin EC 81 MG tablet Take 81 mg by mouth daily after supper.     [name] atorvastatin (LIPITOR) 40 MG tablet TAKE 1 TABLET BY MOUTH EVERY DAY (Patient taking differently: Take 40 mg by mouth daily after supper. ) 90 tablet 1  . carvedilol (COREG) 25 MG tablet Take 1 tablet (25 mg total) by mouth 2 (two) times daily. 180 tablet 1  . ezetimibe (ZETIA) 10 MG tablet TAKE 1 TABLET BY MOUTH EVERY DAY (Patient taking differently: Take 10 mg by mouth daily after supper. ) 90 tablet 2  . feeding supplement, ENSURE ENLIVE, (ENSURE ENLIVE) LIQD Take 237 mLs by mouth 3 (three) times daily between meals. 21330 mL 0  . Multiple Vitamin (MULITIVITAMIN WITH MINERALS) TABS Take 1 tablet by mouth daily with breakfast.     . sodium bicarbonate 650 MG tablet Take 1 tablet (650 mg total) by mouth 2 (two) times daily. 60 tablet 0  .  nitroGLYCERIN (NITROSTAT) 0.4 MG SL tablet Place 1 tablet (0.4 mg total) under the tongue every 5 (five) minutes as needed for chest pain. 25 tablet 3   No facility-administered medications prior to visit.     Per HPI unless specifically indicated in ROS section below Review of Systems Objective:    BP 136/62 (BP Location: Left Arm, Patient Position: Sitting, Cuff Size: Normal)   Pulse 63   Temp 97.6 F (36.4 C) (Temporal)   Ht 5\' 7"   (1.702 m)   Wt 126 lb 7 oz (57.4 kg)   SpO2 96%   BMI 19.80 kg/m   Wt Readings from Last 3 Encounters:  08/30/19 126 lb 7 oz (57.4 kg)  08/22/19 132 lb 11.5 oz (60.2 kg)  08/16/19 120 lb 5 oz (54.6 kg)    Physical Exam Vitals and nursing note reviewed.  Constitutional:      Appearance: Normal appearance. He is not ill-appearing.  HENT:     Head: Normocephalic and atraumatic.  Eyes:     Extraocular Movements: Extraocular movements intact.     Pupils: Pupils are equal, round, and reactive to light.  Cardiovascular:     Rate and Rhythm: Normal rate and regular rhythm.     Pulses: Normal pulses.     Heart sounds: No murmur.  Pulmonary:     Effort: Pulmonary effort is normal. No respiratory distress.     Breath sounds: Normal breath sounds. No wheezing, rhonchi or rales.  Abdominal:     General: Abdomen is flat. There is no distension.     Palpations: Abdomen is soft. There is no mass.     Tenderness: There is no abdominal tenderness. There is no guarding or rebound.     Hernia: No hernia is present.  Musculoskeletal:        General: Normal range of motion.     Left lower leg: No edema.     Comments: S/p R AKA  Skin:    General: Skin is warm and dry.     Coloration: Skin is not jaundiced.     Findings: No erythema or rash.  Neurological:     Mental Status: He is alert.  Psychiatric:        Mood and Affect: Mood normal.        Behavior: Behavior normal.       Results for orders placed or performed during the hospital encounter of 08/19/19  SARS CORONAVIRUS 2 (TAT 6-24 HRS) Nasopharyngeal Nasopharyngeal Swab   Specimen: Nasopharyngeal Swab  Result Value Ref Range   SARS Coronavirus 2 NEGATIVE NEGATIVE  Culture, blood (routine x 2)   Specimen: BLOOD RIGHT WRIST  Result Value Ref Range   Specimen Description BLOOD RIGHT WRIST    Special Requests      BOTTLES DRAWN AEROBIC AND ANAEROBIC Blood Culture adequate volume   Culture  Setup Time      AEROBIC BOTTLE ONLY GRAM  POSITIVE RODS CRITICAL RESULT CALLED TO, READ BACK BY AND VERIFIED WITH: V BRYK PHARMD 08/23/19 0249 JDW    Culture (A)     CORYNEBACTERIUM AMYCOLATUM Standardized susceptibility testing for this organism is not available. Performed at [facility], [place] [address], [place], [place] [address]    Report Status 08/25/2019 FINAL   Culture, blood (routine x 2)   Specimen: BLOOD LEFT FOREARM  Result Value Ref Range   Specimen Description BLOOD LEFT FOREARM    Special Requests      BOTTLES DRAWN AEROBIC AND ANAEROBIC  Blood Culture adequate volume   Culture      NO GROWTH 5 DAYS Performed at [facility] [address] [place], [place] [address]    Report Status 08/25/2019 FINAL   Culture, Urine   Specimen: Urine, Clean Catch  Result Value Ref Range   Specimen Description URINE, CLEAN CATCH    Special Requests      NONE Performed at [facility], [place] [address], [place],  [address]    Culture <10,000 COLONIES/mL INSIGNIFICANT GROWTH (A)    Report Status 08/21/2019 FINAL   C difficile quick scan w PCR reflex   Specimen: STOOL  Result Value Ref Range   C Diff antigen NEGATIVE NEGATIVE   C Diff toxin NEGATIVE NEGATIVE   C Diff interpretation No C. difficile detected.   Basic metabolic panel  Result Value Ref Range   Sodium 135 135 - 145 mmol/L   Potassium 5.9 (H) 3.5 - 5.1 mmol/L   Chloride 110 98 - 111 mmol/L   CO2 16 (L) 22 - 32 mmol/L   Glucose, Bld 157 (H) 70 - 99 mg/dL   BUN 75 (H) 8 - 23 mg/dL   Creatinine, Ser 3.51 (H) 0.61 - 1.24 mg/dL   Calcium 8.4 (L) 8.9 - 10.3 mg/dL   GFR calc non Af Amer 16 (L) >60 mL/min   GFR calc Af Amer 18 (L) >60 mL/min   Anion gap 9 5 - 15  CBC  Result Value Ref Range   WBC 10.7 (H) 4.0 - 10.5 K/uL   RBC 2.71 (L) 4.22 - 5.81 MIL/uL   Hemoglobin 9.1 (L) 13.0 - 17.0 g/dL   HCT 28.3 (L) 39.0 - 52.0 %   MCV 104.4 (H) 80.0 - 100.0 fL   MCH 33.6 26.0 - 34.0 pg   MCHC 32.2 30.0 - 36.0 g/dL   RDW 14.3 11.5 - 15.5 %     Platelets 321 150 - 400 K/uL   nRBC 0.0 0.0 - 0.2 %  Urinalysis, Routine w reflex microscopic  Result Value Ref Range   Color, Urine YELLOW YELLOW   APPearance HAZY (A) CLEAR   Specific Gravity, Urine 1.015 1.005 - 1.030   pH 5.0 5.0 - 8.0   Glucose, UA NEGATIVE NEGATIVE mg/dL   Hgb urine dipstick NEGATIVE NEGATIVE   Bilirubin Urine NEGATIVE NEGATIVE   Ketones, ur NEGATIVE NEGATIVE mg/dL   Protein, ur 30 (A) NEGATIVE mg/dL   Nitrite NEGATIVE NEGATIVE   Leukocytes,Ua MODERATE (A) NEGATIVE   RBC / HPF 0-5 0 - 5 RBC/hpf   WBC, UA 11-20 0 - 5 WBC/hpf   Bacteria, UA RARE (A) NONE SEEN   Squamous Epithelial / LPF 0-5 0 - 5   Mucus PRESENT    Hyaline Casts, UA PRESENT   Differential  Result Value Ref Range   Neutrophils Relative % 89 %   Neutro Abs 8.6 (H) 1.7 - 7.7 K/uL   Lymphocytes Relative 4 %   Lymphs Abs 0.4 (L) 0.7 - 4.0 K/uL   Monocytes Relative 4 %   Monocytes Absolute 0.4 0.1 - 1.0 K/uL   Eosinophils Relative 2 %   Eosinophils Absolute 0.2 0.0 - 0.5 K/uL   Basophils Relative 0 %   Basophils Absolute 0.0 0.0 - 0.1 K/uL   Immature Granulocytes 1 %   Abs Immature Granulocytes 0.09 (H) 0.00 - 0.07 K/uL  Hepatic function panel  Result Value Ref Range   Total Protein 6.1 (L) 6.5 - 8.1 g/dL  Albumin 2.1 (L) 3.5 - 5.0 g/dL   AST 64 (H) 15 - 41 U/L   ALT 80 (H) 0 - 44 U/L   Alkaline Phosphatase 98 38 - 126 U/L   Total Bilirubin 0.7 0.3 - 1.2 mg/dL   Bilirubin, Direct 0.2 0.0 - 0.2 mg/dL   Indirect Bilirubin 0.5 0.3 - 0.9 mg/dL  CBC  Result Value Ref Range   WBC 9.7 4.0 - 10.5 K/uL   RBC 2.74 (L) 4.22 - 5.81 MIL/uL   Hemoglobin 8.9 (L) 13.0 - 17.0 g/dL   HCT 28.0 (L) 39.0 - 52.0 %   MCV 102.2 (H) 80.0 - 100.0 fL   MCH 32.5 26.0 - 34.0 pg   MCHC 31.8 30.0 - 36.0 g/dL   RDW 14.3 11.5 - 15.5 %   Platelets 309 150 - 400 K/uL   nRBC 0.0 0.0 - 0.2 %  Basic metabolic panel  Result Value Ref Range   Sodium 138 135 - 145 mmol/L   Potassium 6.1 (H) 3.5 - 5.1 mmol/L    Chloride 116 (H) 98 - 111 mmol/L   CO2 14 (L) 22 - 32 mmol/L   Glucose, Bld 115 (H) 70 - 99 mg/dL   BUN 72 (H) 8 - 23 mg/dL   Creatinine, Ser 3.32 (H) 0.61 - 1.24 mg/dL   Calcium 7.8 (L) 8.9 - 10.3 mg/dL   GFR calc non Af Amer 17 (L) >60 mL/min   GFR calc Af Amer 19 (L) >60 mL/min   Anion gap 8 5 - 15  TSH  Result Value Ref Range   TSH 0.874 0.350 - 4.500 uIU/mL  Basic metabolic panel  Result Value Ref Range   Sodium 135 135 - 145 mmol/L   Potassium 6.1 (H) 3.5 - 5.1 mmol/L   Chloride 112 (H) 98 - 111 mmol/L   CO2 13 (L) 22 - 32 mmol/L   Glucose, Bld 115 (H) 70 - 99 mg/dL   BUN 75 (H) 8 - 23 mg/dL   Creatinine, Ser 3.36 (H) 0.61 - 1.24 mg/dL   Calcium 8.2 (L) 8.9 - 10.3 mg/dL   GFR calc non Af Amer 16 (L) >60 mL/min   GFR calc Af Amer 19 (L) >60 mL/min   Anion gap 10 5 - 15  Brain natriuretic peptide  Result Value Ref Range   B Natriuretic Peptide 271.4 (H) 0.0 - 100.0 pg/mL  C-reactive protein  Result Value Ref Range   CRP 13.4 (H) <1.0 mg/dL  Procalcitonin - Baseline  Result Value Ref Range   Procalcitonin 0.17 ng/mL  Creatinine, urine, random  Result Value Ref Range   Creatinine, Urine 112.83 mg/dL  Sodium, urine, random  Result Value Ref Range   Sodium, Ur 62 mmol/L  Osmolality, urine  Result Value Ref Range   Osmolality, Ur 553 300 - 900 mOsm/kg  Basic metabolic panel  Result Value Ref Range   Sodium 144 135 - 145 mmol/L   Potassium 4.8 3.5 - 5.1 mmol/L   Chloride 116 (H) 98 - 111 mmol/L   CO2 17 (L) 22 - 32 mmol/L   Glucose, Bld 136 (H) 70 - 99 mg/dL   BUN 72 (H) 8 - 23 mg/dL   Creatinine, Ser 3.43 (H) 0.61 - 1.24 mg/dL   Calcium 8.4 (L) 8.9 - 10.3 mg/dL   GFR calc non Af Amer 16 (L) >60 mL/min   GFR calc Af Amer 19 (L) >60 mL/min   Anion gap 11 5 - 15  Procalcitonin  Result  Value Ref Range   Procalcitonin 0.12 ng/mL  Renal function panel  Result Value Ref Range   Sodium 142 135 - 145 mmol/L   Potassium 4.8 3.5 - 5.1 mmol/L   Chloride 114 (H) 98 -  111 mmol/L   CO2 17 (L) 22 - 32 mmol/L   Glucose, Bld 126 (H) 70 - 99 mg/dL   BUN 63 (H) 8 - 23 mg/dL   Creatinine, Ser 2.91 (H) 0.61 - 1.24 mg/dL   Calcium 7.8 (L) 8.9 - 10.3 mg/dL   Phosphorus 3.6 2.5 - 4.6 mg/dL   Albumin 1.7 (L) 3.5 - 5.0 g/dL   GFR calc non Af Amer 20 (L) >60 mL/min   GFR calc Af Amer 23 (L) >60 mL/min   Anion gap 11 5 - 15  Magnesium  Result Value Ref Range   Magnesium 2.1 1.7 - 2.4 mg/dL  CBC  Result Value Ref Range   WBC 6.9 4.0 - 10.5 K/uL   RBC 2.16 (L) 4.22 - 5.81 MIL/uL   Hemoglobin 7.1 (L) 13.0 - 17.0 g/dL   HCT 22.9 (L) 39.0 - 52.0 %   MCV 106.0 (H) 80.0 - 100.0 fL   MCH 32.9 26.0 - 34.0 pg   MCHC 31.0 30.0 - 36.0 g/dL   RDW 14.4 11.5 - 15.5 %   Platelets 258 150 - 400 K/uL   nRBC 0.0 0.0 - 0.2 %  Occult blood card to lab, stool  Result Value Ref Range   Fecal Occult Bld NEGATIVE NEGATIVE  Hemoglobin and hematocrit, blood  Result Value Ref Range   Hemoglobin 7.4 (L) 13.0 - 17.0 g/dL   HCT 22.6 (L) 39.0 - 52.0 %  Procalcitonin  Result Value Ref Range   Procalcitonin <0.10 ng/mL  Renal function panel  Result Value Ref Range   Sodium 144 135 - 145 mmol/L   Potassium 4.9 3.5 - 5.1 mmol/L   Chloride 114 (H) 98 - 111 mmol/L   CO2 18 (L) 22 - 32 mmol/L   Glucose, Bld 138 (H) 70 - 99 mg/dL   BUN 47 (H) 8 - 23 mg/dL   Creatinine, Ser 2.48 (H) 0.61 - 1.24 mg/dL   Calcium 7.9 (L) 8.9 - 10.3 mg/dL   Phosphorus 2.9 2.5 - 4.6 mg/dL   Albumin 1.6 (L) 3.5 - 5.0 g/dL   GFR calc non Af Amer 24 (L) >60 mL/min   GFR calc Af Amer 28 (L) >60 mL/min   Anion gap 12 5 - 15  Magnesium  Result Value Ref Range   Magnesium 1.9 1.7 - 2.4 mg/dL  CBC  Result Value Ref Range   WBC 8.2 4.0 - 10.5 K/uL   RBC 2.43 (L) 4.22 - 5.81 MIL/uL   Hemoglobin 8.0 (L) 13.0 - 17.0 g/dL   HCT 25.2 (L) 39.0 - 52.0 %   MCV 103.7 (H) 80.0 - 100.0 fL   MCH 32.9 26.0 - 34.0 pg   MCHC 31.7 30.0 - 36.0 g/dL   RDW 14.2 11.5 - 15.5 %   Platelets 289 150 - 400 K/uL   nRBC 0.0 0.0  - 0.2 %  Renal function panel  Result Value Ref Range   Sodium 141 135 - 145 mmol/L   Potassium 4.9 3.5 - 5.1 mmol/L   Chloride 118 (H) 98 - 111 mmol/L   CO2 17 (L) 22 - 32 mmol/L   Glucose, Bld 104 (H) 70 - 99 mg/dL   BUN 41 (H) 8 - 23 mg/dL   Creatinine,  Ser 2.39 (H) 0.61 - 1.24 mg/dL   Calcium 7.8 (L) 8.9 - 10.3 mg/dL   Phosphorus 2.8 2.5 - 4.6 mg/dL   Albumin 1.7 (L) 3.5 - 5.0 g/dL   GFR calc non Af Amer 25 (L) >60 mL/min   GFR calc Af Amer 29 (L) >60 mL/min   Anion gap 6 5 - 15  Magnesium  Result Value Ref Range   Magnesium 1.8 1.7 - 2.4 mg/dL  CBC  Result Value Ref Range   WBC 10.6 (H) 4.0 - 10.5 K/uL   RBC 2.46 (L) 4.22 - 5.81 MIL/uL   Hemoglobin 8.1 (L) 13.0 - 17.0 g/dL   HCT 25.4 (L) 39.0 - 52.0 %   MCV 103.3 (H) 80.0 - 100.0 fL   MCH 32.9 26.0 - 34.0 pg   MCHC 31.9 30.0 - 36.0 g/dL   RDW 14.3 11.5 - 15.5 %   Platelets 278 150 - 400 K/uL   nRBC 0.0 0.0 - 0.2 %  ECHOCARDIOGRAM COMPLETE  Result Value Ref Range   Weight 2,084.67 oz   Height 68 in   BP 134/62 mmHg   Assessment & Plan:  This visit occurred during the SARS-CoV-2 public health emergency.  Safety protocols were in place, including screening questions prior to the visit, additional usage of staff PPE, and extensive cleaning of exam room while observing appropriate contact time as indicated for disinfecting solutions.   Problem List Items Addressed This Visit      History of   HTN (hypertension)    BP remains well controlled only on carvedilol 25mg  bid. Continue this, continue to hold ramipril and spironolactone. He has cards f/u scheduled next month.       AAA (abdominal aortic aneurysm) without rupture ([facility])    S/p EVAR 08/2017, now enlarging on latest imaging.  Has upcoming CT and VVS f/u scheduled for next month, told may need rpt surgery.         Other   Protein-calorie malnutrition ([facility])    Appetite better, taking 2-3 ensure/day + regular meals. Update levels.       Macrocytic anemia     Update iron panel. periph smear reviewed from 08/16/2019.       Relevant Orders   CBC with Differential/Platelet   IBC panel   Vitamin B12   Ferritin   Lactate dehydrogenase   Low bicarbonate level    Continue bicarb replacement 650mg  BID at this time - update levels today.       Hx of AKA (above knee amputation) (HCC) (Chronic)   General weakness    Improving each day. Anticipate related to ARF.      CKD (chronic kidney disease) stage 3, GFR 30-59 ml/min   Acute renal failure (ARF) (HCC) - Primary    Cr 3.5 --> 2.5 on discharge. Baseline Cr 1.5. update renal function today.       Relevant Orders   Comprehensive metabolic panel       No orders of the defined types were placed in this encounter.  Orders Placed This Encounter  Procedures  . CBC with Differential/Platelet  . Comprehensive metabolic panel  . IBC panel  . Vitamin B12  . Ferritin  . Lactate dehydrogenase    Patient Instructions  Labs today Continue current medicines Continue ensure and 3 meals a day.  Continue working with home health therapy.    Follow up plan: Return in about 3 months (around 11/30/2019), or if symptoms worsen or fail to improve, for follow up  visit.  [name], MD

## 2019-08-30 ENCOUNTER — Ambulatory Visit (INDEPENDENT_AMBULATORY_CARE_PROVIDER_SITE_OTHER): Payer: Medicare Other | Admitting: Family Medicine

## 2019-08-30 ENCOUNTER — Telehealth: Payer: Self-pay | Admitting: Radiology

## 2019-08-30 ENCOUNTER — Encounter: Payer: Self-pay | Admitting: Family Medicine

## 2019-08-30 ENCOUNTER — Other Ambulatory Visit: Payer: Self-pay

## 2019-08-30 VITALS — BP 136/62 | HR 63 | Temp 97.6°F | Ht 67.0 in | Wt 126.4 lb

## 2019-08-30 DIAGNOSIS — E440 Moderate protein-calorie malnutrition: Secondary | ICD-10-CM | POA: Diagnosis not present

## 2019-08-30 DIAGNOSIS — N179 Acute kidney failure, unspecified: Secondary | ICD-10-CM

## 2019-08-30 DIAGNOSIS — D539 Nutritional anemia, unspecified: Secondary | ICD-10-CM

## 2019-08-30 DIAGNOSIS — Z89611 Acquired absence of right leg above knee: Secondary | ICD-10-CM | POA: Diagnosis not present

## 2019-08-30 DIAGNOSIS — R531 Weakness: Secondary | ICD-10-CM | POA: Diagnosis not present

## 2019-08-30 DIAGNOSIS — D631 Anemia in chronic kidney disease: Secondary | ICD-10-CM | POA: Diagnosis not present

## 2019-08-30 DIAGNOSIS — N1831 Chronic kidney disease, stage 3a: Secondary | ICD-10-CM | POA: Diagnosis not present

## 2019-08-30 DIAGNOSIS — I10 Essential (primary) hypertension: Secondary | ICD-10-CM

## 2019-08-30 DIAGNOSIS — I714 Abdominal aortic aneurysm, without rupture, unspecified: Secondary | ICD-10-CM

## 2019-08-30 DIAGNOSIS — N1832 Chronic kidney disease, stage 3b: Secondary | ICD-10-CM | POA: Diagnosis not present

## 2019-08-30 DIAGNOSIS — I5020 Unspecified systolic (congestive) heart failure: Secondary | ICD-10-CM | POA: Diagnosis not present

## 2019-08-30 DIAGNOSIS — E878 Other disorders of electrolyte and fluid balance, not elsewhere classified: Secondary | ICD-10-CM

## 2019-08-30 DIAGNOSIS — I2510 Atherosclerotic heart disease of native coronary artery without angina pectoris: Secondary | ICD-10-CM | POA: Diagnosis not present

## 2019-08-30 DIAGNOSIS — I130 Hypertensive heart and chronic kidney disease with heart failure and stage 1 through stage 4 chronic kidney disease, or unspecified chronic kidney disease: Secondary | ICD-10-CM | POA: Diagnosis not present

## 2019-08-30 DIAGNOSIS — E46 Unspecified protein-calorie malnutrition: Secondary | ICD-10-CM | POA: Insufficient documentation

## 2019-08-30 LAB — CBC WITH DIFFERENTIAL/PLATELET
Basophils Absolute: 0 10*3/uL (ref 0.0–0.1)
Basophils Relative: 0.7 % (ref 0.0–3.0)
Eosinophils Absolute: 0.2 10*3/uL (ref 0.0–0.7)
Eosinophils Relative: 3.1 % (ref 0.0–5.0)
HCT: 21.7 % — CL (ref 39.0–52.0)
Hemoglobin: 7.3 g/dL — CL (ref 13.0–17.0)
Lymphocytes Relative: 16.9 % (ref 12.0–46.0)
Lymphs Abs: 0.9 10*3/uL (ref 0.7–4.0)
MCHC: 33.8 g/dL (ref 30.0–36.0)
MCV: 97.1 fl (ref 78.0–100.0)
Monocytes Absolute: 0.5 10*3/uL (ref 0.1–1.0)
Monocytes Relative: 8.9 % (ref 3.0–12.0)
Neutro Abs: 3.7 10*3/uL (ref 1.4–7.7)
Neutrophils Relative %: 70.4 % (ref 43.0–77.0)
Platelets: 222 10*3/uL (ref 150.0–400.0)
RBC: 2.24 Mil/uL — ABNORMAL LOW (ref 4.22–5.81)
RDW: 14.2 % (ref 11.5–15.5)
WBC: 5.3 10*3/uL (ref 4.0–10.5)

## 2019-08-30 LAB — COMPREHENSIVE METABOLIC PANEL
ALT: 39 U/L (ref 0–53)
AST: 33 U/L (ref 0–37)
Albumin: 2.5 g/dL — ABNORMAL LOW (ref 3.5–5.2)
Alkaline Phosphatase: 67 U/L (ref 39–117)
BUN: 42 mg/dL — ABNORMAL HIGH (ref 6–23)
CO2: 25 mEq/L (ref 19–32)
Calcium: 8.1 mg/dL — ABNORMAL LOW (ref 8.4–10.5)
Chloride: 106 mEq/L (ref 96–112)
Creatinine, Ser: 2.28 mg/dL — ABNORMAL HIGH (ref 0.40–1.50)
GFR: 27.82 mL/min — ABNORMAL LOW (ref >60.00)
Glucose, Bld: 101 mg/dL — ABNORMAL HIGH (ref 70–99)
Potassium: 5.4 mEq/L — ABNORMAL HIGH (ref 3.5–5.1)
Sodium: 136 mEq/L (ref 135–145)
Total Bilirubin: 0.5 mg/dL (ref 0.2–1.2)
Total Protein: 5.8 g/dL — ABNORMAL LOW (ref 6.0–8.3)

## 2019-08-30 LAB — VITAMIN B12: Vitamin B-12: 960 pg/mL — ABNORMAL HIGH (ref 211–911)

## 2019-08-30 LAB — IBC PANEL
Iron: 19 ug/dL — ABNORMAL LOW (ref 42–165)
Saturation Ratios: 11.8 % — ABNORMAL LOW (ref 20.0–50.0)
Transferrin: 115 mg/dL — ABNORMAL LOW (ref 212.0–360.0)

## 2019-08-30 LAB — FERRITIN: Ferritin: 320 ng/mL (ref 22.0–322.0)

## 2019-08-30 NOTE — Assessment & Plan Note (Signed)
S/p EVAR 08/2017, now enlarging on latest imaging.  Has upcoming CT and VVS f/u scheduled for next month, told may need rpt surgery.

## 2019-08-30 NOTE — Telephone Encounter (Signed)
[facility] lab called critical results, HGB - 7.3, HCT - 21.7. Results given to Dr Danise Mina

## 2019-08-30 NOTE — Assessment & Plan Note (Signed)
BP remains well controlled only on carvedilol 25mg  bid. Continue this, continue to hold ramipril and spironolactone. He has cards f/u scheduled next month.

## 2019-08-30 NOTE — Assessment & Plan Note (Signed)
Cr 3.5 --> 2.5 on discharge. Baseline Cr 1.5. update renal function today.

## 2019-08-30 NOTE — Assessment & Plan Note (Signed)
Continue bicarb replacement 650mg  BID at this time - update levels today.

## 2019-08-30 NOTE — Assessment & Plan Note (Signed)
Update iron panel. periph smear reviewed from 08/16/2019.

## 2019-08-30 NOTE — Patient Instructions (Addendum)
Labs today Continue current medicines Continue ensure and 3 meals a day.  Continue working with home health therapy.

## 2019-08-30 NOTE — Assessment & Plan Note (Signed)
Appetite better, taking 2-3 ensure/day + regular meals. Update levels.

## 2019-08-30 NOTE — Assessment & Plan Note (Signed)
Improving each day. Anticipate related to ARF.

## 2019-08-31 DIAGNOSIS — I130 Hypertensive heart and chronic kidney disease with heart failure and stage 1 through stage 4 chronic kidney disease, or unspecified chronic kidney disease: Secondary | ICD-10-CM | POA: Diagnosis not present

## 2019-08-31 DIAGNOSIS — E440 Moderate protein-calorie malnutrition: Secondary | ICD-10-CM | POA: Diagnosis not present

## 2019-08-31 DIAGNOSIS — D631 Anemia in chronic kidney disease: Secondary | ICD-10-CM | POA: Diagnosis not present

## 2019-08-31 DIAGNOSIS — I5020 Unspecified systolic (congestive) heart failure: Secondary | ICD-10-CM | POA: Diagnosis not present

## 2019-08-31 DIAGNOSIS — I2510 Atherosclerotic heart disease of native coronary artery without angina pectoris: Secondary | ICD-10-CM | POA: Diagnosis not present

## 2019-08-31 DIAGNOSIS — N1831 Chronic kidney disease, stage 3a: Secondary | ICD-10-CM | POA: Diagnosis not present

## 2019-08-31 LAB — LACTATE DEHYDROGENASE: LDH: 178 U/L (ref 120–250)

## 2019-08-31 NOTE — Telephone Encounter (Signed)
Noted. Will await full labs.

## 2019-09-01 ENCOUNTER — Telehealth: Payer: Self-pay | Admitting: Family Medicine

## 2019-09-01 DIAGNOSIS — N1831 Chronic kidney disease, stage 3a: Secondary | ICD-10-CM | POA: Diagnosis not present

## 2019-09-01 DIAGNOSIS — E440 Moderate protein-calorie malnutrition: Secondary | ICD-10-CM | POA: Diagnosis not present

## 2019-09-01 DIAGNOSIS — I130 Hypertensive heart and chronic kidney disease with heart failure and stage 1 through stage 4 chronic kidney disease, or unspecified chronic kidney disease: Secondary | ICD-10-CM | POA: Diagnosis not present

## 2019-09-01 DIAGNOSIS — I5020 Unspecified systolic (congestive) heart failure: Secondary | ICD-10-CM | POA: Diagnosis not present

## 2019-09-01 DIAGNOSIS — I2510 Atherosclerotic heart disease of native coronary artery without angina pectoris: Secondary | ICD-10-CM | POA: Diagnosis not present

## 2019-09-01 DIAGNOSIS — D631 Anemia in chronic kidney disease: Secondary | ICD-10-CM | POA: Diagnosis not present

## 2019-09-01 NOTE — Telephone Encounter (Signed)
Patient stated he received a call from Dr Darnell Level.  After speaking with him earlier today He stated there was not a message left so he is not sure who called. I looked through the chart and did not see anything  He would like a call back

## 2019-09-01 NOTE — Telephone Encounter (Signed)
Returned pt's call.  No answer.  Lvm informing him our office has closed and I will not be able to call back until Monday.   [Dr. Darnell Level sent pt a MyChart message about his lab results.]

## 2019-09-05 ENCOUNTER — Telehealth: Payer: Self-pay | Admitting: Physician Assistant

## 2019-09-05 ENCOUNTER — Encounter: Payer: Self-pay | Admitting: Family Medicine

## 2019-09-05 ENCOUNTER — Telehealth: Payer: Self-pay | Admitting: *Deleted

## 2019-09-05 DIAGNOSIS — E440 Moderate protein-calorie malnutrition: Secondary | ICD-10-CM | POA: Diagnosis not present

## 2019-09-05 DIAGNOSIS — N1831 Chronic kidney disease, stage 3a: Secondary | ICD-10-CM | POA: Diagnosis not present

## 2019-09-05 DIAGNOSIS — I5020 Unspecified systolic (congestive) heart failure: Secondary | ICD-10-CM | POA: Diagnosis not present

## 2019-09-05 DIAGNOSIS — I130 Hypertensive heart and chronic kidney disease with heart failure and stage 1 through stage 4 chronic kidney disease, or unspecified chronic kidney disease: Secondary | ICD-10-CM | POA: Diagnosis not present

## 2019-09-05 DIAGNOSIS — D631 Anemia in chronic kidney disease: Secondary | ICD-10-CM | POA: Diagnosis not present

## 2019-09-05 DIAGNOSIS — I2510 Atherosclerotic heart disease of native coronary artery without angina pectoris: Secondary | ICD-10-CM | POA: Diagnosis not present

## 2019-09-05 NOTE — Telephone Encounter (Signed)
Agree with this. Thank you.  

## 2019-09-05 NOTE — Telephone Encounter (Signed)
Anda Kraft PT with [facility] left a voicemail requesting verbal orders to increase PT frequency to twice a week for 7 weeks. Anda Kraft stated that patient is doing good with therapy. Anda Kraft stated when calling back it is okay to leave the orders on her voicemail because it is a Psychologist, occupational.

## 2019-09-05 NOTE — Telephone Encounter (Signed)
Lvm for Anda Kraft, notifying her Dr. Darnell Level is giving verbal orders for services requested for pt.

## 2019-09-05 NOTE — Telephone Encounter (Signed)
Pt scheduled lab visit tomorrow at 10:15.

## 2019-09-05 NOTE — Telephone Encounter (Signed)
Spoke with Santiago Glad. Santiago Glad reports patient has no choice but to be accompanied to appointment due to amputation status. Given the okay to attend appointment.

## 2019-09-05 NOTE — Telephone Encounter (Signed)
Stephen [name] is calling requesting she attend Oval's upcoming appt scheduled for 09/07/19 due to him being an amputee. Please advise.

## 2019-09-06 ENCOUNTER — Other Ambulatory Visit: Payer: Self-pay

## 2019-09-06 ENCOUNTER — Other Ambulatory Visit (INDEPENDENT_AMBULATORY_CARE_PROVIDER_SITE_OTHER): Payer: Medicare Other

## 2019-09-06 ENCOUNTER — Other Ambulatory Visit: Payer: Self-pay | Admitting: Family Medicine

## 2019-09-06 DIAGNOSIS — N179 Acute kidney failure, unspecified: Secondary | ICD-10-CM

## 2019-09-06 LAB — CBC WITH DIFFERENTIAL/PLATELET
Basophils Absolute: 0 10*3/uL (ref 0.0–0.1)
Basophils Relative: 0.9 % (ref 0.0–3.0)
Eosinophils Absolute: 0.2 10*3/uL (ref 0.0–0.7)
Eosinophils Relative: 3.9 % (ref 0.0–5.0)
HCT: 24.2 % — ABNORMAL LOW (ref 39.0–52.0)
Hemoglobin: 8.1 g/dL — ABNORMAL LOW (ref 13.0–17.0)
Lymphocytes Relative: 21 % (ref 12.0–46.0)
Lymphs Abs: 1.1 10*3/uL (ref 0.7–4.0)
MCHC: 33.3 g/dL (ref 30.0–36.0)
MCV: 96.6 fl (ref 78.0–100.0)
Monocytes Absolute: 0.5 10*3/uL (ref 0.1–1.0)
Monocytes Relative: 8.4 % (ref 3.0–12.0)
Neutro Abs: 3.6 10*3/uL (ref 1.4–7.7)
Neutrophils Relative %: 65.8 % (ref 43.0–77.0)
Platelets: 316 10*3/uL (ref 150.0–400.0)
RBC: 2.51 Mil/uL — ABNORMAL LOW (ref 4.22–5.81)
RDW: 14.7 % (ref 11.5–15.5)
WBC: 5.5 10*3/uL (ref 4.0–10.5)

## 2019-09-06 LAB — RENAL FUNCTION PANEL
Albumin: 2.8 g/dL — ABNORMAL LOW (ref 3.5–5.2)
BUN: 44 mg/dL — ABNORMAL HIGH (ref 6–23)
CO2: 22 mEq/L (ref 19–32)
Calcium: 8.2 mg/dL — ABNORMAL LOW (ref 8.4–10.5)
Chloride: 108 mEq/L (ref 96–112)
Creatinine, Ser: 2.67 mg/dL — ABNORMAL HIGH (ref 0.40–1.50)
GFR: 23.18 mL/min — ABNORMAL LOW (ref >60.00)
Glucose, Bld: 150 mg/dL — ABNORMAL HIGH (ref 70–99)
Phosphorus: 3 mg/dL (ref 2.3–4.6)
Potassium: 4.9 mEq/L (ref 3.5–5.1)
Sodium: 137 mEq/L (ref 135–145)

## 2019-09-07 ENCOUNTER — Encounter: Payer: Self-pay | Admitting: Physician Assistant

## 2019-09-07 ENCOUNTER — Other Ambulatory Visit: Payer: Self-pay | Admitting: Family Medicine

## 2019-09-07 ENCOUNTER — Ambulatory Visit (INDEPENDENT_AMBULATORY_CARE_PROVIDER_SITE_OTHER): Payer: Medicare Other | Admitting: Physician Assistant

## 2019-09-07 VITALS — BP 103/57 | HR 68 | Temp 98.6°F | Ht 68.0 in | Wt 125.0 lb

## 2019-09-07 DIAGNOSIS — N184 Chronic kidney disease, stage 4 (severe): Secondary | ICD-10-CM

## 2019-09-07 DIAGNOSIS — I255 Ischemic cardiomyopathy: Secondary | ICD-10-CM

## 2019-09-07 DIAGNOSIS — I714 Abdominal aortic aneurysm, without rupture, unspecified: Secondary | ICD-10-CM

## 2019-09-07 DIAGNOSIS — I2510 Atherosclerotic heart disease of native coronary artery without angina pectoris: Secondary | ICD-10-CM | POA: Diagnosis not present

## 2019-09-07 DIAGNOSIS — N179 Acute kidney failure, unspecified: Secondary | ICD-10-CM

## 2019-09-07 DIAGNOSIS — I10 Essential (primary) hypertension: Secondary | ICD-10-CM | POA: Diagnosis not present

## 2019-09-07 DIAGNOSIS — N281 Cyst of kidney, acquired: Secondary | ICD-10-CM

## 2019-09-07 MED ORDER — NITROGLYCERIN 0.4 MG SL SUBL: 0.4000 mg | SUBLINGUAL_TABLET | SUBLINGUAL | 2 refills | Status: AC | PRN

## 2019-09-07 MED ORDER — CARVEDILOL 12.5 MG PO TABS: 12.5000 mg | ORAL_TABLET | Freq: Two times a day (BID) | ORAL | 3 refills | Status: AC
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
## 2019-09-07 NOTE — Patient Instructions (Signed)
Medication Instructions:   DECREASE Carvedilol (Coreg) to 12.5 mg 2 times a day  *If you need a refill on your cardiac medications before your next appointment, please call your pharmacy*  Lab Work: NONE ordered at this time of appointment   If you have labs (blood work) drawn today and your tests are completely normal, you will receive your results only by: [name] MyChart Message (if you have MyChart) OR . A paper copy in the mail If you have any lab test that is abnormal or we need to change your treatment, we will call you to review the results.  Testing/Procedures: NONE ordered at this time of appointment   Follow-Up: At [facility], you and your health needs are our priority.  As part of our continuing mission to provide you with exceptional heart care, we have created designated Provider Care Teams.  These Care Teams include your primary Cardiologist (physician) and Advanced Practice Providers (APPs -  Physician Assistants and Nurse Practitioners) who all work together to provide you with the care you need, when you need it.  Your next appointment:   2 month(s)  The format for your next appointment:   In Person  Provider:   Shelva Majestic, MD  Other Instructions

## 2019-09-09 DIAGNOSIS — E440 Moderate protein-calorie malnutrition: Secondary | ICD-10-CM | POA: Diagnosis not present

## 2019-09-09 DIAGNOSIS — I2510 Atherosclerotic heart disease of native coronary artery without angina pectoris: Secondary | ICD-10-CM | POA: Diagnosis not present

## 2019-09-09 DIAGNOSIS — N1831 Chronic kidney disease, stage 3a: Secondary | ICD-10-CM | POA: Diagnosis not present

## 2019-09-09 DIAGNOSIS — D631 Anemia in chronic kidney disease: Secondary | ICD-10-CM | POA: Diagnosis not present

## 2019-09-09 DIAGNOSIS — I130 Hypertensive heart and chronic kidney disease with heart failure and stage 1 through stage 4 chronic kidney disease, or unspecified chronic kidney disease: Secondary | ICD-10-CM | POA: Diagnosis not present

## 2019-09-09 DIAGNOSIS — I5020 Unspecified systolic (congestive) heart failure: Secondary | ICD-10-CM | POA: Diagnosis not present

## 2019-09-12 ENCOUNTER — Ambulatory Visit
Admission: RE | Admit: 2019-09-12 | Discharge: 2019-09-12 | Disposition: A | Discharge status: Home / Self Care | Payer: Medicare Other | Source: Ambulatory Visit | Attending: Family Medicine | Admitting: Family Medicine

## 2019-09-12 DIAGNOSIS — N179 Acute kidney failure, unspecified: Secondary | ICD-10-CM

## 2019-09-12 DIAGNOSIS — N281 Cyst of kidney, acquired: Secondary | ICD-10-CM

## 2019-09-13 DIAGNOSIS — E440 Moderate protein-calorie malnutrition: Secondary | ICD-10-CM | POA: Diagnosis not present

## 2019-09-13 DIAGNOSIS — I5020 Unspecified systolic (congestive) heart failure: Secondary | ICD-10-CM | POA: Diagnosis not present

## 2019-09-13 DIAGNOSIS — D631 Anemia in chronic kidney disease: Secondary | ICD-10-CM | POA: Diagnosis not present

## 2019-09-13 DIAGNOSIS — I130 Hypertensive heart and chronic kidney disease with heart failure and stage 1 through stage 4 chronic kidney disease, or unspecified chronic kidney disease: Secondary | ICD-10-CM | POA: Diagnosis not present

## 2019-09-13 DIAGNOSIS — N1831 Chronic kidney disease, stage 3a: Secondary | ICD-10-CM | POA: Diagnosis not present

## 2019-09-13 DIAGNOSIS — I2510 Atherosclerotic heart disease of native coronary artery without angina pectoris: Secondary | ICD-10-CM | POA: Diagnosis not present

## 2019-09-14 ENCOUNTER — Telehealth: Payer: Self-pay

## 2019-09-14 NOTE — Telephone Encounter (Signed)
Stephen [name] was in the hospital in March for kidney failure.  Dr. Trula Slade wanted him to have a CTA abd/pelvis with contrast to F/U AAA.  The patient is scheduled 4/23 for the CT and to see Dr. Trula Slade on 4/26.  The patient's primary MD and cardiologist are advising him against any more contrast than he has to.  He would like to know if he should keep/change/cancel the CT appointment and appointment with Dr. Trula Slade to follow up?  Staff message sent to Dr. Trula Slade and [name], [place]

## 2019-09-15 DIAGNOSIS — N1330 Unspecified hydronephrosis: Secondary | ICD-10-CM | POA: Diagnosis not present

## 2019-09-15 DIAGNOSIS — R8281 Pyuria: Secondary | ICD-10-CM | POA: Diagnosis not present

## 2019-09-15 DIAGNOSIS — D631 Anemia in chronic kidney disease: Secondary | ICD-10-CM | POA: Diagnosis not present

## 2019-09-15 DIAGNOSIS — N189 Chronic kidney disease, unspecified: Secondary | ICD-10-CM | POA: Diagnosis not present

## 2019-09-15 DIAGNOSIS — N184 Chronic kidney disease, stage 4 (severe): Secondary | ICD-10-CM | POA: Diagnosis not present

## 2019-09-15 NOTE — Telephone Encounter (Signed)
From Dr. Trula Slade on 09/14/19:  Stephen Mitchell, MD  [name]  Phone Number: [phone]  We can start with a AAA duplex       I called [name] and scheduled him for a AAA ultrasound on 09/21/19.  His CTA on 4/23 has been cancelled and [name] is aware of that also.  [name] [employer]

## 2019-09-16 DIAGNOSIS — I5020 Unspecified systolic (congestive) heart failure: Secondary | ICD-10-CM | POA: Diagnosis not present

## 2019-09-16 DIAGNOSIS — I130 Hypertensive heart and chronic kidney disease with heart failure and stage 1 through stage 4 chronic kidney disease, or unspecified chronic kidney disease: Secondary | ICD-10-CM | POA: Diagnosis not present

## 2019-09-16 DIAGNOSIS — E440 Moderate protein-calorie malnutrition: Secondary | ICD-10-CM | POA: Diagnosis not present

## 2019-09-16 DIAGNOSIS — N1831 Chronic kidney disease, stage 3a: Secondary | ICD-10-CM | POA: Diagnosis not present

## 2019-09-16 DIAGNOSIS — D631 Anemia in chronic kidney disease: Secondary | ICD-10-CM | POA: Diagnosis not present

## 2019-09-16 DIAGNOSIS — I2510 Atherosclerotic heart disease of native coronary artery without angina pectoris: Secondary | ICD-10-CM | POA: Diagnosis not present

## 2019-09-20 ENCOUNTER — Telehealth (HOSPITAL_COMMUNITY): Payer: Self-pay

## 2019-09-20 DIAGNOSIS — E440 Moderate protein-calorie malnutrition: Secondary | ICD-10-CM | POA: Diagnosis not present

## 2019-09-20 DIAGNOSIS — I5020 Unspecified systolic (congestive) heart failure: Secondary | ICD-10-CM | POA: Diagnosis not present

## 2019-09-20 DIAGNOSIS — I2510 Atherosclerotic heart disease of native coronary artery without angina pectoris: Secondary | ICD-10-CM | POA: Diagnosis not present

## 2019-09-20 DIAGNOSIS — I130 Hypertensive heart and chronic kidney disease with heart failure and stage 1 through stage 4 chronic kidney disease, or unspecified chronic kidney disease: Secondary | ICD-10-CM | POA: Diagnosis not present

## 2019-09-20 DIAGNOSIS — D631 Anemia in chronic kidney disease: Secondary | ICD-10-CM | POA: Diagnosis not present

## 2019-09-20 DIAGNOSIS — N1831 Chronic kidney disease, stage 3a: Secondary | ICD-10-CM | POA: Diagnosis not present

## 2019-09-20 NOTE — Telephone Encounter (Signed)

## 2019-09-21 ENCOUNTER — Other Ambulatory Visit: Payer: Self-pay

## 2019-09-21 ENCOUNTER — Ambulatory Visit (HOSPITAL_COMMUNITY)
Admission: RE | Admit: 2019-09-21 | Discharge: 2019-09-21 | Disposition: A | Discharge status: Home / Self Care | Payer: Medicare Other | Source: Ambulatory Visit | Attending: Vascular Surgery | Admitting: Vascular Surgery

## 2019-09-21 ENCOUNTER — Other Ambulatory Visit: Payer: Self-pay | Admitting: *Deleted

## 2019-09-21 DIAGNOSIS — I714 Abdominal aortic aneurysm, without rupture, unspecified: Secondary | ICD-10-CM

## 2019-09-23 ENCOUNTER — Other Ambulatory Visit: Payer: Medicare Other

## 2019-09-23 ENCOUNTER — Telehealth (HOSPITAL_COMMUNITY): Payer: Self-pay

## 2019-09-23 DIAGNOSIS — I130 Hypertensive heart and chronic kidney disease with heart failure and stage 1 through stage 4 chronic kidney disease, or unspecified chronic kidney disease: Secondary | ICD-10-CM | POA: Diagnosis not present

## 2019-09-23 DIAGNOSIS — Z9181 History of falling: Secondary | ICD-10-CM | POA: Diagnosis not present

## 2019-09-23 DIAGNOSIS — K3189 Other diseases of stomach and duodenum: Secondary | ICD-10-CM | POA: Diagnosis not present

## 2019-09-23 DIAGNOSIS — I255 Ischemic cardiomyopathy: Secondary | ICD-10-CM | POA: Diagnosis not present

## 2019-09-23 DIAGNOSIS — Z89611 Acquired absence of right leg above knee: Secondary | ICD-10-CM | POA: Diagnosis not present

## 2019-09-23 DIAGNOSIS — N1831 Chronic kidney disease, stage 3a: Secondary | ICD-10-CM | POA: Diagnosis not present

## 2019-09-23 DIAGNOSIS — B182 Chronic viral hepatitis C: Secondary | ICD-10-CM | POA: Diagnosis not present

## 2019-09-23 DIAGNOSIS — N1330 Unspecified hydronephrosis: Secondary | ICD-10-CM | POA: Diagnosis not present

## 2019-09-23 DIAGNOSIS — D696 Thrombocytopenia, unspecified: Secondary | ICD-10-CM | POA: Diagnosis not present

## 2019-09-23 DIAGNOSIS — Z720 Tobacco use: Secondary | ICD-10-CM | POA: Diagnosis not present

## 2019-09-23 DIAGNOSIS — I5020 Unspecified systolic (congestive) heart failure: Secondary | ICD-10-CM | POA: Diagnosis not present

## 2019-09-23 DIAGNOSIS — D539 Nutritional anemia, unspecified: Secondary | ICD-10-CM | POA: Diagnosis not present

## 2019-09-23 DIAGNOSIS — Z955 Presence of coronary angioplasty implant and graft: Secondary | ICD-10-CM | POA: Diagnosis not present

## 2019-09-23 DIAGNOSIS — I080 Rheumatic disorders of both mitral and aortic valves: Secondary | ICD-10-CM | POA: Diagnosis not present

## 2019-09-23 DIAGNOSIS — E440 Moderate protein-calorie malnutrition: Secondary | ICD-10-CM | POA: Diagnosis not present

## 2019-09-23 DIAGNOSIS — K7460 Unspecified cirrhosis of liver: Secondary | ICD-10-CM | POA: Diagnosis not present

## 2019-09-23 DIAGNOSIS — E785 Hyperlipidemia, unspecified: Secondary | ICD-10-CM | POA: Diagnosis not present

## 2019-09-23 DIAGNOSIS — I252 Old myocardial infarction: Secondary | ICD-10-CM | POA: Diagnosis not present

## 2019-09-23 DIAGNOSIS — D631 Anemia in chronic kidney disease: Secondary | ICD-10-CM | POA: Diagnosis not present

## 2019-09-23 DIAGNOSIS — Z7982 Long term (current) use of aspirin: Secondary | ICD-10-CM | POA: Diagnosis not present

## 2019-09-23 DIAGNOSIS — K5790 Diverticulosis of intestine, part unspecified, without perforation or abscess without bleeding: Secondary | ICD-10-CM | POA: Diagnosis not present

## 2019-09-23 DIAGNOSIS — I2510 Atherosclerotic heart disease of native coronary artery without angina pectoris: Secondary | ICD-10-CM | POA: Diagnosis not present

## 2019-09-23 DIAGNOSIS — M545 Low back pain: Secondary | ICD-10-CM | POA: Diagnosis not present

## 2019-09-23 NOTE — Telephone Encounter (Signed)

## 2019-09-26 ENCOUNTER — Emergency Department (HOSPITAL_COMMUNITY): Payer: Medicare Other | Admitting: Anesthesiology

## 2019-09-26 ENCOUNTER — Other Ambulatory Visit: Payer: Self-pay

## 2019-09-26 ENCOUNTER — Ambulatory Visit (INDEPENDENT_AMBULATORY_CARE_PROVIDER_SITE_OTHER): Payer: Medicare Other | Admitting: Surgery

## 2019-09-26 ENCOUNTER — Encounter (HOSPITAL_COMMUNITY): Admission: EM | Disposition: E | Payer: Self-pay | Source: Home / Self Care | Attending: Neurology

## 2019-09-26 ENCOUNTER — Emergency Department (HOSPITAL_COMMUNITY): Payer: Medicare Other

## 2019-09-26 ENCOUNTER — Inpatient Hospital Stay (HOSPITAL_COMMUNITY)
Admission: EM | Admit: 2019-09-26 | Discharge: 2019-10-01 | DRG: 023 | Disposition: E | Payer: Medicare Other | Attending: Neurology | Admitting: Neurology

## 2019-09-26 ENCOUNTER — Encounter: Payer: Self-pay | Admitting: Surgery

## 2019-09-26 VITALS — BP 117/69 | HR 66 | Temp 98.0°F | Resp 20 | Ht 68.0 in | Wt 125.0 lb

## 2019-09-26 DIAGNOSIS — I10 Essential (primary) hypertension: Secondary | ICD-10-CM | POA: Diagnosis not present

## 2019-09-26 DIAGNOSIS — D539 Nutritional anemia, unspecified: Secondary | ICD-10-CM | POA: Diagnosis present

## 2019-09-26 DIAGNOSIS — N179 Acute kidney failure, unspecified: Secondary | ICD-10-CM | POA: Diagnosis not present

## 2019-09-26 DIAGNOSIS — Z20822 Contact with and (suspected) exposure to covid-19: Secondary | ICD-10-CM | POA: Diagnosis present

## 2019-09-26 DIAGNOSIS — J988 Other specified respiratory disorders: Secondary | ICD-10-CM | POA: Diagnosis not present

## 2019-09-26 DIAGNOSIS — R4701 Aphasia: Secondary | ICD-10-CM | POA: Diagnosis present

## 2019-09-26 DIAGNOSIS — Z7982 Long term (current) use of aspirin: Secondary | ICD-10-CM

## 2019-09-26 DIAGNOSIS — I6602 Occlusion and stenosis of left middle cerebral artery: Secondary | ICD-10-CM | POA: Diagnosis present

## 2019-09-26 DIAGNOSIS — I63233 Cerebral infarction due to unspecified occlusion or stenosis of bilateral carotid arteries: Secondary | ICD-10-CM | POA: Diagnosis not present

## 2019-09-26 DIAGNOSIS — Z9911 Dependence on respirator [ventilator] status: Secondary | ICD-10-CM | POA: Diagnosis not present

## 2019-09-26 DIAGNOSIS — G8191 Hemiplegia, unspecified affecting right dominant side: Secondary | ICD-10-CM | POA: Diagnosis not present

## 2019-09-26 DIAGNOSIS — J9601 Acute respiratory failure with hypoxia: Secondary | ICD-10-CM | POA: Diagnosis not present

## 2019-09-26 DIAGNOSIS — I5022 Chronic systolic (congestive) heart failure: Secondary | ICD-10-CM | POA: Diagnosis present

## 2019-09-26 DIAGNOSIS — S065X9A Traumatic subdural hemorrhage with loss of consciousness of unspecified duration, initial encounter: Secondary | ICD-10-CM | POA: Diagnosis present

## 2019-09-26 DIAGNOSIS — Z681 Body mass index (BMI) 19 or less, adult: Secondary | ICD-10-CM

## 2019-09-26 DIAGNOSIS — F17290 Nicotine dependence, other tobacco product, uncomplicated: Secondary | ICD-10-CM | POA: Diagnosis present

## 2019-09-26 DIAGNOSIS — I615 Nontraumatic intracerebral hemorrhage, intraventricular: Secondary | ICD-10-CM | POA: Diagnosis not present

## 2019-09-26 DIAGNOSIS — R414 Neurologic neglect syndrome: Secondary | ICD-10-CM | POA: Diagnosis not present

## 2019-09-26 DIAGNOSIS — Z4682 Encounter for fitting and adjustment of non-vascular catheter: Secondary | ICD-10-CM | POA: Diagnosis not present

## 2019-09-26 DIAGNOSIS — Z9889 Other specified postprocedural states: Secondary | ICD-10-CM | POA: Diagnosis not present

## 2019-09-26 DIAGNOSIS — R338 Other retention of urine: Secondary | ICD-10-CM | POA: Diagnosis not present

## 2019-09-26 DIAGNOSIS — J9690 Respiratory failure, unspecified, unspecified whether with hypoxia or hypercapnia: Secondary | ICD-10-CM | POA: Diagnosis not present

## 2019-09-26 DIAGNOSIS — E1165 Type 2 diabetes mellitus with hyperglycemia: Secondary | ICD-10-CM | POA: Diagnosis not present

## 2019-09-26 DIAGNOSIS — R110 Nausea: Secondary | ICD-10-CM | POA: Diagnosis not present

## 2019-09-26 DIAGNOSIS — R54 Age-related physical debility: Secondary | ICD-10-CM | POA: Diagnosis present

## 2019-09-26 DIAGNOSIS — I63412 Cerebral infarction due to embolism of left middle cerebral artery: Secondary | ICD-10-CM | POA: Diagnosis not present

## 2019-09-26 DIAGNOSIS — Z8679 Personal history of other diseases of the circulatory system: Secondary | ICD-10-CM

## 2019-09-26 DIAGNOSIS — I639 Cerebral infarction, unspecified: Secondary | ICD-10-CM

## 2019-09-26 DIAGNOSIS — I609 Nontraumatic subarachnoid hemorrhage, unspecified: Secondary | ICD-10-CM | POA: Diagnosis present

## 2019-09-26 DIAGNOSIS — E440 Moderate protein-calorie malnutrition: Secondary | ICD-10-CM | POA: Diagnosis present

## 2019-09-26 DIAGNOSIS — G9349 Other encephalopathy: Secondary | ICD-10-CM | POA: Diagnosis present

## 2019-09-26 DIAGNOSIS — G935 Compression of brain: Secondary | ICD-10-CM | POA: Diagnosis present

## 2019-09-26 DIAGNOSIS — E785 Hyperlipidemia, unspecified: Secondary | ICD-10-CM | POA: Diagnosis present

## 2019-09-26 DIAGNOSIS — B1920 Unspecified viral hepatitis C without hepatic coma: Secondary | ICD-10-CM | POA: Diagnosis present

## 2019-09-26 DIAGNOSIS — D62 Acute posthemorrhagic anemia: Secondary | ICD-10-CM | POA: Diagnosis not present

## 2019-09-26 DIAGNOSIS — I2510 Atherosclerotic heart disease of native coronary artery without angina pectoris: Secondary | ICD-10-CM | POA: Diagnosis present

## 2019-09-26 DIAGNOSIS — E1151 Type 2 diabetes mellitus with diabetic peripheral angiopathy without gangrene: Secondary | ICD-10-CM | POA: Diagnosis present

## 2019-09-26 DIAGNOSIS — I97618 Postprocedural hemorrhage and hematoma of a circulatory system organ or structure following other circulatory system procedure: Secondary | ICD-10-CM | POA: Diagnosis not present

## 2019-09-26 DIAGNOSIS — E875 Hyperkalemia: Secondary | ICD-10-CM | POA: Diagnosis not present

## 2019-09-26 DIAGNOSIS — Z8249 Family history of ischemic heart disease and other diseases of the circulatory system: Secondary | ICD-10-CM

## 2019-09-26 DIAGNOSIS — Z823 Family history of stroke: Secondary | ICD-10-CM

## 2019-09-26 DIAGNOSIS — Z8673 Personal history of transient ischemic attack (TIA), and cerebral infarction without residual deficits: Secondary | ICD-10-CM

## 2019-09-26 DIAGNOSIS — Z79899 Other long term (current) drug therapy: Secondary | ICD-10-CM

## 2019-09-26 DIAGNOSIS — Z66 Do not resuscitate: Secondary | ICD-10-CM | POA: Diagnosis not present

## 2019-09-26 DIAGNOSIS — R579 Shock, unspecified: Secondary | ICD-10-CM | POA: Diagnosis not present

## 2019-09-26 DIAGNOSIS — N1832 Chronic kidney disease, stage 3b: Secondary | ICD-10-CM | POA: Diagnosis present

## 2019-09-26 DIAGNOSIS — R29722 NIHSS score 22: Secondary | ICD-10-CM | POA: Diagnosis present

## 2019-09-26 DIAGNOSIS — N402 Nodular prostate without lower urinary tract symptoms: Secondary | ICD-10-CM | POA: Diagnosis present

## 2019-09-26 DIAGNOSIS — I255 Ischemic cardiomyopathy: Secondary | ICD-10-CM

## 2019-09-26 DIAGNOSIS — N401 Enlarged prostate with lower urinary tract symptoms: Secondary | ICD-10-CM | POA: Diagnosis not present

## 2019-09-26 DIAGNOSIS — G936 Cerebral edema: Secondary | ICD-10-CM | POA: Diagnosis not present

## 2019-09-26 DIAGNOSIS — Y838 Other surgical procedures as the cause of abnormal reaction of the patient, or of later complication, without mention of misadventure at the time of the procedure: Secondary | ICD-10-CM | POA: Diagnosis not present

## 2019-09-26 DIAGNOSIS — E870 Hyperosmolality and hypernatremia: Secondary | ICD-10-CM | POA: Diagnosis not present

## 2019-09-26 DIAGNOSIS — I130 Hypertensive heart and chronic kidney disease with heart failure and stage 1 through stage 4 chronic kidney disease, or unspecified chronic kidney disease: Secondary | ICD-10-CM | POA: Diagnosis present

## 2019-09-26 DIAGNOSIS — Z89611 Acquired absence of right leg above knee: Secondary | ICD-10-CM

## 2019-09-26 DIAGNOSIS — R29810 Facial weakness: Secondary | ICD-10-CM | POA: Diagnosis not present

## 2019-09-26 DIAGNOSIS — I4510 Unspecified right bundle-branch block: Secondary | ICD-10-CM | POA: Diagnosis not present

## 2019-09-26 DIAGNOSIS — G9340 Encephalopathy, unspecified: Secondary | ICD-10-CM

## 2019-09-26 DIAGNOSIS — Z955 Presence of coronary angioplasty implant and graft: Secondary | ICD-10-CM

## 2019-09-26 DIAGNOSIS — I63512 Cerebral infarction due to unspecified occlusion or stenosis of left middle cerebral artery: Secondary | ICD-10-CM | POA: Diagnosis not present

## 2019-09-26 DIAGNOSIS — Z515 Encounter for palliative care: Secondary | ICD-10-CM | POA: Diagnosis not present

## 2019-09-26 DIAGNOSIS — I714 Abdominal aortic aneurysm, without rupture, unspecified: Secondary | ICD-10-CM

## 2019-09-26 DIAGNOSIS — I97638 Postprocedural hematoma of a circulatory system organ or structure following other circulatory system procedure: Secondary | ICD-10-CM | POA: Diagnosis not present

## 2019-09-26 DIAGNOSIS — J9602 Acute respiratory failure with hypercapnia: Secondary | ICD-10-CM | POA: Diagnosis not present

## 2019-09-26 DIAGNOSIS — Z8711 Personal history of peptic ulcer disease: Secondary | ICD-10-CM

## 2019-09-26 DIAGNOSIS — Z800 Family history of malignant neoplasm of digestive organs: Secondary | ICD-10-CM

## 2019-09-26 DIAGNOSIS — Z818 Family history of other mental and behavioral disorders: Secondary | ICD-10-CM

## 2019-09-26 DIAGNOSIS — S301XXA Contusion of abdominal wall, initial encounter: Secondary | ICD-10-CM | POA: Diagnosis not present

## 2019-09-26 DIAGNOSIS — E1122 Type 2 diabetes mellitus with diabetic chronic kidney disease: Secondary | ICD-10-CM | POA: Diagnosis present

## 2019-09-26 DIAGNOSIS — Z95828 Presence of other vascular implants and grafts: Secondary | ICD-10-CM

## 2019-09-26 DIAGNOSIS — I252 Old myocardial infarction: Secondary | ICD-10-CM

## 2019-09-26 DIAGNOSIS — I6389 Other cerebral infarction: Secondary | ICD-10-CM | POA: Diagnosis not present

## 2019-09-26 DIAGNOSIS — R404 Transient alteration of awareness: Secondary | ICD-10-CM | POA: Diagnosis not present

## 2019-09-26 HISTORY — PX: IR CT HEAD LTD: IMG2386

## 2019-09-26 HISTORY — PX: RADIOLOGY WITH ANESTHESIA: SHX6223

## 2019-09-26 HISTORY — PX: IR PERCUTANEOUS ART THROMBECTOMY/INFUSION INTRACRANIAL INC DIAG ANGIO: IMG6087

## 2019-09-26 LAB — CBC
HCT: 27.6 % — ABNORMAL LOW (ref 39.0–52.0)
Hemoglobin: 8.4 g/dL — ABNORMAL LOW (ref 13.0–17.0)
MCH: 31.5 pg (ref 26.0–34.0)
MCHC: 30.4 g/dL (ref 30.0–36.0)
MCV: 103.4 fL — ABNORMAL HIGH (ref 80.0–100.0)
Platelets: 146 10*3/uL — ABNORMAL LOW (ref 150–400)
RBC: 2.67 MIL/uL — ABNORMAL LOW (ref 4.22–5.81)
RDW: 16.4 % — ABNORMAL HIGH (ref 11.5–15.5)
WBC: 7.4 10*3/uL (ref 4.0–10.5)
nRBC: 0 % (ref 0.0–0.2)

## 2019-09-26 LAB — POCT I-STAT 7, (LYTES, BLD GAS, ICA,H+H)
Acid-base deficit: 5 mmol/L — ABNORMAL HIGH (ref 0.0–2.0)
Bicarbonate: 20 mmol/L (ref 20.0–28.0)
Calcium, Ion: 1.21 mmol/L (ref 1.15–1.40)
HCT: 19 % — ABNORMAL LOW (ref 39.0–52.0)
Hemoglobin: 6.5 g/dL — CL (ref 13.0–17.0)
O2 Saturation: 100 %
Potassium: 5.2 mmol/L — ABNORMAL HIGH (ref 3.5–5.1)
Sodium: 138 mmol/L (ref 135–145)
TCO2: 21 mmol/L — ABNORMAL LOW (ref 22–32)
pCO2 arterial: 34.3 mmHg (ref 32.0–48.0)
pH, Arterial: 7.375 (ref 7.350–7.450)
pO2, Arterial: 404 mmHg — ABNORMAL HIGH (ref 83.0–108.0)

## 2019-09-26 LAB — COMPREHENSIVE METABOLIC PANEL
ALT: 21 U/L (ref 0–44)
AST: 22 U/L (ref 15–41)
Albumin: 2.9 g/dL — ABNORMAL LOW (ref 3.5–5.0)
Alkaline Phosphatase: 76 U/L (ref 38–126)
Anion gap: 11 (ref 5–15)
BUN: 41 mg/dL — ABNORMAL HIGH (ref 8–23)
CO2: 20 mmol/L — ABNORMAL LOW (ref 22–32)
Calcium: 8.9 mg/dL (ref 8.9–10.3)
Chloride: 109 mmol/L (ref 98–111)
Creatinine, Ser: 2.2 mg/dL — ABNORMAL HIGH (ref 0.61–1.24)
GFR calc Af Amer: 32 mL/min — ABNORMAL LOW (ref >60)
GFR calc non Af Amer: 27 mL/min — ABNORMAL LOW (ref >60)
Glucose, Bld: 156 mg/dL — ABNORMAL HIGH (ref 70–99)
Potassium: 4.6 mmol/L (ref 3.5–5.1)
Sodium: 140 mmol/L (ref 135–145)
Total Bilirubin: 0.6 mg/dL (ref 0.3–1.2)
Total Protein: 6.7 g/dL (ref 6.5–8.1)

## 2019-09-26 LAB — I-STAT CHEM 8, ED
BUN: 36 mg/dL — ABNORMAL HIGH (ref 8–23)
Calcium, Ion: 1.14 mmol/L — ABNORMAL LOW (ref 1.15–1.40)
Chloride: 116 mmol/L — ABNORMAL HIGH (ref 98–111)
Creatinine, Ser: 2 mg/dL — ABNORMAL HIGH (ref 0.61–1.24)
Glucose, Bld: 145 mg/dL — ABNORMAL HIGH (ref 70–99)
HCT: 25 % — ABNORMAL LOW (ref 39.0–52.0)
Hemoglobin: 8.5 g/dL — ABNORMAL LOW (ref 13.0–17.0)
Potassium: 4.5 mmol/L (ref 3.5–5.1)
Sodium: 139 mmol/L (ref 135–145)
TCO2: 21 mmol/L — ABNORMAL LOW (ref 22–32)

## 2019-09-26 LAB — DIFFERENTIAL
Abs Immature Granulocytes: 0.02 10*3/uL (ref 0.00–0.07)
Basophils Absolute: 0 10*3/uL (ref 0.0–0.1)
Basophils Relative: 1 %
Eosinophils Absolute: 0.5 10*3/uL (ref 0.0–0.5)
Eosinophils Relative: 6 %
Immature Granulocytes: 0 %
Lymphocytes Relative: 17 %
Lymphs Abs: 1.2 10*3/uL (ref 0.7–4.0)
Monocytes Absolute: 0.6 10*3/uL (ref 0.1–1.0)
Monocytes Relative: 9 %
Neutro Abs: 5 10*3/uL (ref 1.7–7.7)
Neutrophils Relative %: 67 %

## 2019-09-26 LAB — RESPIRATORY PANEL BY RT PCR (FLU A&B, COVID)
Influenza A by PCR: NEGATIVE
Influenza B by PCR: NEGATIVE
SARS Coronavirus 2 by RT PCR: NEGATIVE

## 2019-09-26 LAB — PROTIME-INR
INR: 1.3 — ABNORMAL HIGH (ref 0.8–1.2)
Prothrombin Time: 15.7 seconds — ABNORMAL HIGH (ref 11.4–15.2)

## 2019-09-26 LAB — APTT: aPTT: 34 seconds (ref 24–36)

## 2019-09-26 LAB — PREPARE RBC (CROSSMATCH)

## 2019-09-26 LAB — FIBRINOGEN: Fibrinogen: 74 mg/dL — CL (ref 210–475)

## 2019-09-26 SURGERY — RADIOLOGY WITH ANESTHESIA
Anesthesia: General

## 2019-09-26 MED ORDER — FENTANYL CITRATE (PF) 100 MCG/2ML IJ SOLN
INTRAMUSCULAR | Status: AC
  Filled 2019-09-26: qty 2

## 2019-09-26 MED ORDER — ATORVASTATIN CALCIUM 40 MG PO TABS: 40.0000 mg | ORAL_TABLET | Freq: Every day | ORAL | Status: DC

## 2019-09-26 MED ORDER — SODIUM CHLORIDE 0.9 % IV SOLN: Freq: Once | INTRAVENOUS | Status: DC

## 2019-09-26 MED ORDER — MIDAZOLAM HCL 2 MG/2ML IJ SOLN: 1.0000 mg | INTRAMUSCULAR | Status: DC | PRN

## 2019-09-26 MED ORDER — ACETAMINOPHEN 325 MG PO TABS: 650.0000 mg | ORAL_TABLET | ORAL | Status: DC | PRN

## 2019-09-26 MED ORDER — ROCURONIUM 10MG/ML (10ML) SYRINGE FOR MEDFUSION PUMP - OPTIME
INTRAVENOUS | Status: DC | PRN
  Administered 2019-09-26: 20 mg via INTRAVENOUS
  Administered 2019-09-26: 50 mg via INTRAVENOUS

## 2019-09-26 MED ORDER — ACETAMINOPHEN 650 MG RE SUPP: 650.0000 mg | RECTAL | Status: DC | PRN

## 2019-09-26 MED ORDER — STROKE: EARLY STAGES OF RECOVERY BOOK: Freq: Once | Status: DC

## 2019-09-26 MED ORDER — LIDOCAINE HCL (CARDIAC) PF 100 MG/5ML IV SOSY
PREFILLED_SYRINGE | INTRAVENOUS | Status: DC | PRN
Start: 2019-09-26 — End: 2019-09-26
  Administered 2019-09-26: 100 mg via INTRATRACHEAL

## 2019-09-26 MED ORDER — NITROGLYCERIN 1 MG/10 ML FOR IR/CATH LAB
INTRA_ARTERIAL | Status: AC | PRN
  Administered 2019-09-26 (×2): 25 ug via INTRA_ARTERIAL

## 2019-09-26 MED ORDER — SUCCINYLCHOLINE 20MG/ML (10ML) SYRINGE FOR MEDFUSION PUMP - OPTIME
INTRAMUSCULAR | Status: DC | PRN
Start: 2019-09-26 — End: 2019-09-26
  Administered 2019-09-26: 120 mg via INTRAVENOUS

## 2019-09-26 MED ORDER — TRANEXAMIC ACID-NACL 1000-0.7 MG/100ML-% IV SOLN
1000.0000 mg | INTRAVENOUS | Status: DC
  Filled 2019-09-26 (×3): qty 100

## 2019-09-26 MED ORDER — NOREPINEPHRINE 4 MG/250ML-% IV SOLN
INTRAVENOUS | Status: AC
  Administered 2019-09-26: 22:00:00 3 ug/min via INTRAVENOUS
  Filled 2019-09-26: qty 250

## 2019-09-26 MED ORDER — IOHEXOL 300 MG/ML  SOLN
150.0000 mL | Freq: Once | INTRAMUSCULAR | Status: AC | PRN
  Administered 2019-09-26: 21:00:00 80 mL via INTRA_ARTERIAL

## 2019-09-26 MED ORDER — SODIUM CHLORIDE 0.9% IV SOLUTION: Freq: Once | INTRAVENOUS | Status: DC

## 2019-09-26 MED ORDER — POLYETHYLENE GLYCOL 3350 17 G PO PACK: 17.0000 g | PACK | Freq: Every day | ORAL | Status: DC

## 2019-09-26 MED ORDER — SODIUM CHLORIDE 0.9 % IV SOLN: INTRAVENOUS | Status: DC

## 2019-09-26 MED ORDER — MIDAZOLAM HCL 2 MG/2ML IJ SOLN
1.0000 mg | INTRAMUSCULAR | Status: DC | PRN
  Administered 2019-09-27: 1 mg via INTRAVENOUS
  Filled 2019-09-26: qty 2

## 2019-09-26 MED ORDER — DOCUSATE SODIUM 50 MG/5ML PO LIQD: 100.0000 mg | Freq: Two times a day (BID) | ORAL | Status: DC

## 2019-09-26 MED ORDER — FENTANYL BOLUS VIA INFUSION
25.0000 ug | INTRAVENOUS | Status: DC | PRN
  Administered 2019-09-27: 03:00:00 25 ug via INTRAVENOUS
  Filled 2019-09-26: qty 25

## 2019-09-26 MED ORDER — EZETIMIBE 10 MG PO TABS: 10.0000 mg | ORAL_TABLET | Freq: Every day | ORAL | Status: DC

## 2019-09-26 MED ORDER — CEFAZOLIN SODIUM-DEXTROSE 2-3 GM-%(50ML) IV SOLR
INTRAVENOUS | Status: DC | PRN
  Administered 2019-09-26: 2 g via INTRAVENOUS

## 2019-09-26 MED ORDER — SODIUM CHLORIDE 0.9 % IV SOLN: 250.0000 mL | INTRAVENOUS | Status: DC

## 2019-09-26 MED ORDER — FENTANYL CITRATE (PF) 100 MCG/2ML IJ SOLN
INTRAMUSCULAR | Status: DC | PRN
  Administered 2019-09-26 (×2): 50 ug via INTRAVENOUS

## 2019-09-26 MED ORDER — PANTOPRAZOLE SODIUM 40 MG IV SOLR
40.0000 mg | Freq: Every day | INTRAVENOUS | Status: DC
  Administered 2019-09-26: 23:00:00 40 mg via INTRAVENOUS
  Filled 2019-09-26: qty 40

## 2019-09-26 MED ORDER — NOREPINEPHRINE 4 MG/250ML-% IV SOLN: 2.0000 ug/min | INTRAVENOUS | Status: DC

## 2019-09-26 MED ORDER — SODIUM CHLORIDE 0.9% FLUSH: 3.0000 mL | Freq: Once | INTRAVENOUS | Status: DC

## 2019-09-26 MED ORDER — PROPOFOL 10 MG/ML IV BOLUS
INTRAVENOUS | Status: DC | PRN
  Administered 2019-09-26: 70 mg via INTRAVENOUS

## 2019-09-26 MED ORDER — NITROGLYCERIN 1 MG/10 ML FOR IR/CATH LAB
INTRA_ARTERIAL | Status: AC
  Filled 2019-09-26: qty 10

## 2019-09-26 MED ORDER — SODIUM CHLORIDE 0.9 % IV SOLN: INTRAVENOUS | Status: DC | PRN

## 2019-09-26 MED ORDER — TRANEXAMIC ACID-NACL 1000-0.7 MG/100ML-% IV SOLN
1000.0000 mg | INTRAVENOUS | Status: AC
  Administered 2019-09-26: 22:00:00 1000 mg via INTRAVENOUS
  Filled 2019-09-26: qty 100

## 2019-09-26 MED ORDER — LACTATED RINGERS IV SOLN: INTRAVENOUS | Status: DC | PRN

## 2019-09-26 MED ORDER — ACETAMINOPHEN 160 MG/5ML PO SOLN: 650.0000 mg | ORAL | Status: DC | PRN

## 2019-09-26 MED ORDER — VERAPAMIL HCL 2.5 MG/ML IV SOLN
INTRAVENOUS | Status: AC
  Filled 2019-09-26: qty 2

## 2019-09-26 MED ORDER — CLEVIDIPINE BUTYRATE 0.5 MG/ML IV EMUL
0.0000 mg/h | INTRAVENOUS | Status: AC
  Administered 2019-09-26: 21:00:00 5 mg/h via INTRAVENOUS
  Filled 2019-09-26 (×2): qty 50

## 2019-09-26 MED ORDER — IOHEXOL 350 MG/ML SOLN
75.0000 mL | Freq: Once | INTRAVENOUS | Status: AC | PRN
  Administered 2019-09-26: 19:00:00 75 mL via INTRAVENOUS

## 2019-09-26 MED ORDER — FENTANYL 2500MCG IN NS 250ML (10MCG/ML) PREMIX INFUSION
25.0000 ug/h | INTRAVENOUS | Status: DC
  Administered 2019-09-26: 25 ug/h via INTRAVENOUS
  Filled 2019-09-26: qty 250

## 2019-09-26 MED ORDER — "THROMBI-PAD 3""X3"" EX PADS"
2.0000 | MEDICATED_PAD | Freq: Once | CUTANEOUS | Status: AC
  Administered 2019-09-26: 23:00:00 2 via TOPICAL
  Filled 2019-09-26: qty 2

## 2019-09-26 MED ORDER — VERAPAMIL HCL 2.5 MG/ML IV SOLN
INTRAVENOUS | Status: AC | PRN
  Administered 2019-09-26: 2.5 mg via INTRA_ARTERIAL

## 2019-09-26 MED ORDER — ALTEPLASE (STROKE) FULL DOSE INFUSION
0.9000 mg/kg | Freq: Once | INTRAVENOUS | Status: DC
  Filled 2019-09-26: qty 100

## 2019-09-26 MED ORDER — IOHEXOL 300 MG/ML  SOLN
150.0000 mL | Freq: Once | INTRAMUSCULAR | Status: AC | PRN
  Administered 2019-09-26: 20 mL via INTRA_ARTERIAL

## 2019-09-26 MED ORDER — SODIUM CHLORIDE 0.9 % IV SOLN: 50.0000 mL | Freq: Once | INTRAVENOUS | Status: DC

## 2019-09-26 MED ORDER — FENTANYL CITRATE (PF) 100 MCG/2ML IJ SOLN: 25.0000 ug | Freq: Once | INTRAMUSCULAR | Status: DC

## 2019-09-26 MED ORDER — PROPOFOL 500 MG/50ML IV EMUL
INTRAVENOUS | Status: DC | PRN
  Administered 2019-09-26: 35 ug/kg/min via INTRAVENOUS

## 2019-09-26 MED ORDER — CALCIUM GLUCONATE-NACL 1-0.675 GM/50ML-% IV SOLN
1.0000 g | Freq: Once | INTRAVENOUS | Status: AC
  Administered 2019-09-26: 23:00:00 1000 mg via INTRAVENOUS
  Filled 2019-09-26: qty 50

## 2019-09-26 MED ORDER — SENNOSIDES-DOCUSATE SODIUM 8.6-50 MG PO TABS: 1.0000 | ORAL_TABLET | Freq: Every evening | ORAL | Status: DC | PRN

## 2019-09-26 MED ORDER — PANTOPRAZOLE SODIUM 40 MG PO PACK: 40.0000 mg | PACK | Freq: Every day | ORAL | Status: DC

## 2019-09-26 MED ORDER — CEFAZOLIN SODIUM-DEXTROSE 2-4 GM/100ML-% IV SOLN
INTRAVENOUS | Status: AC
  Filled 2019-09-26: qty 100

## 2019-09-26 NOTE — ED Triage Notes (Signed)
[facility] called by pt's wife for sudden onset of AMS and Aphasia.

## 2019-09-26 NOTE — Anesthesia Procedure Notes (Deleted)
Arterial Line Insertion Start/End04/21/2021 7:22 PM, 09/23/2019 7:31 PM Performed by: Annye Asa, MD, Oletta Lamas, CRNA, anesthesiologist  Patient location: OOR procedure area. Preanesthetic checklist: patient identified, IV checked, monitors and equipment checked, pre-op evaluation, timeout performed and anesthesia consent Emergency situation Left, radial was placed Catheter size: 20 G Hand hygiene performed  and Seldinger technique used Allen's test indicative of satisfactory collateral circulation Attempts: 1 (after CRNA attempt) Procedure performed without using ultrasound guided technique. Following insertion, dressing applied and Biopatch. Post procedure assessment: normal  Patient tolerated the procedure well with no immediate complications.

## 2019-09-26 NOTE — Progress Notes (Signed)
On arrival to 4N @2104  patients L radial art line developing prominent hematoma and continuous bleeding at insertion site. Art line removed as sheath still in place and able to transduce pressures. Manual pressure being held.  @ 2110 noted new and increasing hematoma at L groin site. Manual pressure being held, Dr Estanislado Pandy notified. No new orders except to continue to hold pressure.   Dr Leonel Ramsay and PCCM at bedside.

## 2019-09-26 NOTE — Sedation Documentation (Addendum)
TPA infusion complete NS flush hung at 48cc/hr for a volume of 50cc.

## 2019-09-26 NOTE — ED Provider Notes (Signed)
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 ED Discharge Orders    None       Filbert Berthold, MD 09/27/19 Sharl Ma    Lajean Saver, MD 09/27/19 [phone]

## 2019-09-26 NOTE — Sedation Documentation (Signed)
Spoke with Sherita in pt placement. Pt to be admitted to 4N26.
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
## 2019-09-26 NOTE — Progress Notes (Signed)
Collected ABG.  PaO2 was 404, changed FIO2 to 40% per ABG results.  Hbg was 6.5 on IStat, notified Apolonio Schneiders, RN who notified MDs that were in room of result.  Will continue to monitor.
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
## 2019-09-26 NOTE — Sedation Documentation (Signed)
Spoke with [place] with pt transport. Requested 4N26 be brought to IR2.

## 2019-09-26 NOTE — Progress Notes (Addendum)
Patient developed left groin swelling on arrival to 4N. Pressure was applied, at the same time this was noticed, his pressure went from 170s to 80s/30s(maps in the mid 50s) by both transduced and cuff pressure. He was started on NS bolus and on levophed with good response. Dr. Estanislado Pandy and Dr. Carlis Abbott responded to the bedside. He was never significantly tachycardic though this is of less clear significance in the setting of beta blockade.   He had been off of propofol for about 20 minutes and was hypertensive, he was started on lowest does cleviprex, but only for approximately 30 seconds when the BP drop was noted.   I-stat hgb 6.5,  8.4 on admission. He was given TXA and cryoprecipitate was ordered.   I ordered 2 u PRBC and will get stat CT abd pelvis.   This patient is critically ill and at significant risk of neurological worsening, death and care requires constant monitoring of vital signs, hemodynamics,respiratory and cardiac monitoring, neurological assessment, discussion with family, other specialists and medical decision making of high complexity. I spent 35 minutes of neurocritical care time  in the care of  this patient. This was time spent independent of any time provided by nurse practitioner or PA.  Roland Rack, MD [facility] [phone]  If 7pm- 7am, please page neurology on call as listed in [place]. 09/27/2019  1:12 AM

## 2019-09-26 NOTE — Progress Notes (Signed)
Pharmacist Code Stroke Response  Notified to mix tPA at [place] by Dr. Lorraine Lax Delivered tPA to RN at [place] administered by RN at 1845  tPA dose = 4.8mg  bolus over 1 minute followed by 43.2mg  for a total dose of 48mg  over 1 hour  Issues/delays encountered (if applicable): None   Albertina Parr, PharmD., BCPS, BCCCP Clinical Pharmacist Clinical phone for 09/03/2019 until 11pm: [phone] If after 11pm, please refer to [facility] for unit-specific pharmacist
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
## 2019-09-26 NOTE — Anesthesia Preprocedure Evaluation (Addendum)
Anesthesia Evaluation  Patient identified by MRN, date of birth, ID band Patient awake and Patient confused    Reviewed: Allergy & Precautions, Patient's Chart, lab work & pertinent test results, Unable to perform ROS - Chart review onlyPreop documentation limited or incomplete due to emergent nature of procedure.  History of Anesthesia Complications Negative for: history of anesthetic complications  Airway   TM Distance: >3 FB Neck ROM: Limited  Mouth opening: Limited Mouth Opening Comment: Pt uncooperative Dental  (+) Caps   Pulmonary Current Smoker and Patient abstained from smoking.,  COVID status pending   breath sounds clear to auscultation       Cardiovascular hypertension, Pt. on medications and Pt. on home beta blockers + Past MI, + Cardiac Stents and + Peripheral Vascular Disease (s/p stent graft for AAA 2019: now probable endoleak)   Rhythm:Regular Rate:Normal  08/2019 ECHO: LVEF is approxiamtely 35% with akinesis of the anteroseptum, mid/distal inferoseptum, distal inferior and apical walls. The left ventricular internal cavity  size was moderately dilated. Grade 1 DD Mild MR   Neuro/Psych Acute CVA: last seen normal 3:30pm, awoke from nap confused, gaze deviated to L, R arm plegic Treated with tPA  S/p SDH CVA    GI/Hepatic negative GI ROS, (+) Hepatitis - (treated with Harvoni: chronic), C  Endo/Other  negative endocrine ROS  Renal/GU Renal InsufficiencyRenal disease     Musculoskeletal   Abdominal   Peds  Hematology  (+) anemia , Hb 8.5, plt 146k   Anesthesia Other Findings   Reproductive/Obstetrics                            Anesthesia Physical Anesthesia Plan  ASA: IV and emergent  Anesthesia Plan: General   Post-op Pain Management:    Induction: Intravenous and Rapid sequence  PONV Risk Score and Plan: 1 and Treatment may vary due to age or medical  condition  Airway Management Planned: Oral ETT  Additional Equipment: Arterial line  Intra-op Plan:   Post-operative Plan: Post-operative intubation/ventilation  Informed Consent: I have reviewed the patients History and Physical, chart, labs and discussed the procedure including the risks, benefits and alternatives for the proposed anesthesia with the patient or authorized representative who has indicated his/her understanding and acceptance.     Consent reviewed with POA, History available from chart only and Only emergency history available  Plan Discussed with: CRNA and Surgeon  Anesthesia Plan Comments: (Consent from wife  (telephone [phone]))       Anesthesia Quick Evaluation
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
## 2019-09-26 NOTE — Sedation Documentation (Signed)
TPA administration not charted on MAR. Dose infused via IV pump at Rx'ed rate. Spoke with Otila Kluver, RN, ED charge. Will take to leadership.

## 2019-09-26 NOTE — Sedation Documentation (Signed)
Spoke with pt [place], RN, 4N charge to confirm bed assignment. Requested full O2 tank to be placed on bed.

## 2019-09-26 NOTE — Anesthesia Procedure Notes (Addendum)
Date/Time: 09/28/2019 7:12 PM Performed by: Valetta Fuller, CRNA Pre-anesthesia Checklist: Patient identified, Emergency Drugs available, Suction available and Patient being monitored Patient Re-evaluated:Patient Re-evaluated prior to induction Oxygen Delivery Method: Ambu bag Preoxygenation: Pre-oxygenation with 100% oxygen Induction Type: IV induction, Rapid sequence and Cricoid Pressure applied Laryngoscope Size: Glidescope Grade View: Grade I Tube size: 7.5 mm Number of attempts: 1 Airway Equipment and Method: Stylet Placement Confirmation: ETT inserted through vocal cords under direct vision,  breath sounds checked- equal and bilateral and CO2 detector Secured at: 24 cm Tube secured with: Tape Dental Injury: Teeth and Oropharynx as per pre-operative assessment

## 2019-09-26 NOTE — Procedures (Signed)
S/P Lt common carotid artreriogram followed by revascularization of LT MCA M 1 occlusion with x 1 pass with  53mm x 40 mm solitaireX retriever device and penumbra aspiration achieving a TICI 3 revascularization. Post procedure CT brain NO ICH or mass effect. LT CFA sheath left in place because of atherosclerosis .[name] Distal pulses Lt DP and PT palpable. Patient left intubated for airway protection per anesthesia.Arlean Hopping MD

## 2019-09-26 NOTE — Progress Notes (Signed)
Patient ID: Stephen [name], male   DOB: [birth year], 79 y.o.   MRN: [id number] INR 36 Y M RT H LSW  3. 30 pm. MRSS 2 New onset RT sided weakness and aphasia.. CT brain No ich. ASPECTS 6 CTA occluded Lt MCA M 1 seg. IVTPA given. Dr. Lorraine Lax discussed endovascular treatment for Lt MCA occusion with the spouse over the phone. Procedure,reasons,alternatives reviewed. Risk of ICH of 10 % with worsening neuro deficit,death and inability to revascularize reviewed. Spouse expressed understanding and provided consent to the treatment. S,Luke Rigsbee MD

## 2019-09-26 NOTE — Anesthesia Procedure Notes (Addendum)
Arterial Line Insertion Start/End04/20/2021 7:29 PM, 09/17/2019 7:34 PM Performed by: Annye Asa, MD, Oletta Lamas, CRNA, anesthesiologist  Patient location: OOR procedure area. Preanesthetic checklist: patient identified, risks and benefits discussed, monitors and equipment checked and pre-op evaluation Emergency situation Left, radial was placed Catheter size: 20 G Hand hygiene performed  and maximum sterile barriers used   Attempts: 1 Procedure performed without using ultrasound guided technique. Following insertion, dressing applied and Biopatch. Post procedure assessment: normal  Patient tolerated the procedure well with no immediate complications.

## 2019-09-26 NOTE — Transfer of Care (Signed)
Immediate Anesthesia Transfer of Care Note  Patient: Stephen [name]  Procedure(s) Performed: RADIOLOGY WITH ANESTHESIA (N/A )  Patient Location: NICU  Anesthesia Type:General  Level of Consciousness: Patient remains intubated per anesthesia plan  Airway & Oxygen Therapy: Patient placed on Ventilator (see vital sign flow sheet for setting)  Post-op Assessment: Report given to RN and Post -op Vital signs reviewed and stable  Post vital signs: Reviewed and stable  Last Vitals:  Vitals Value Taken Time  BP    Temp 36.8 C 09/17/2019 2100  Pulse 62 09/29/2019 2113  Resp 18 09/27/2019 2113  SpO2 100 % 09/11/2019 2113  Vitals shown include unvalidated device data.  Last Pain:  Vitals:   09/15/2019 2100  TempSrc: Axillary         Complications: No apparent anesthesia complications

## 2019-09-27 ENCOUNTER — Inpatient Hospital Stay (HOSPITAL_COMMUNITY): Payer: Medicare Other

## 2019-09-27 DIAGNOSIS — I6389 Other cerebral infarction: Secondary | ICD-10-CM

## 2019-09-27 DIAGNOSIS — I63512 Cerebral infarction due to unspecified occlusion or stenosis of left middle cerebral artery: Secondary | ICD-10-CM | POA: Diagnosis not present

## 2019-09-27 DIAGNOSIS — J988 Other specified respiratory disorders: Secondary | ICD-10-CM | POA: Diagnosis not present

## 2019-09-27 LAB — CBC WITH DIFFERENTIAL/PLATELET
Abs Immature Granulocytes: 0.04 10*3/uL (ref 0.00–0.07)
Basophils Absolute: 0 10*3/uL (ref 0.0–0.1)
Basophils Relative: 0 %
Eosinophils Absolute: 0 10*3/uL (ref 0.0–0.5)
Eosinophils Relative: 0 %
HCT: 31.4 % — ABNORMAL LOW (ref 39.0–52.0)
Hemoglobin: 10.2 g/dL — ABNORMAL LOW (ref 13.0–17.0)
Immature Granulocytes: 0 %
Lymphocytes Relative: 9 %
Lymphs Abs: 1 10*3/uL (ref 0.7–4.0)
MCH: 30.9 pg (ref 26.0–34.0)
MCHC: 32.5 g/dL (ref 30.0–36.0)
MCV: 95.2 fL (ref 80.0–100.0)
Monocytes Absolute: 0.6 10*3/uL (ref 0.1–1.0)
Monocytes Relative: 6 %
Neutro Abs: 8.7 10*3/uL — ABNORMAL HIGH (ref 1.7–7.7)
Neutrophils Relative %: 85 %
Platelets: UNDETERMINED 10*3/uL (ref 150–400)
RBC: 3.3 MIL/uL — ABNORMAL LOW (ref 4.22–5.81)
RDW: 17.2 % — ABNORMAL HIGH (ref 11.5–15.5)
WBC: 10.4 10*3/uL (ref 4.0–10.5)
nRBC: 0 % (ref 0.0–0.2)

## 2019-09-27 LAB — GLUCOSE, CAPILLARY
Glucose-Capillary: 112 mg/dL — ABNORMAL HIGH (ref 70–99)
Glucose-Capillary: 114 mg/dL — ABNORMAL HIGH (ref 70–99)
Glucose-Capillary: 121 mg/dL — ABNORMAL HIGH (ref 70–99)
Glucose-Capillary: 141 mg/dL — ABNORMAL HIGH (ref 70–99)
Glucose-Capillary: 151 mg/dL — ABNORMAL HIGH (ref 70–99)

## 2019-09-27 LAB — CBC
HCT: 26.6 % — ABNORMAL LOW (ref 39.0–52.0)
Hemoglobin: 8.8 g/dL — ABNORMAL LOW (ref 13.0–17.0)
MCH: 30.3 pg (ref 26.0–34.0)
MCHC: 33.1 g/dL (ref 30.0–36.0)
MCV: 91.7 fL (ref 80.0–100.0)
Platelets: UNDETERMINED 10*3/uL (ref 150–400)
RBC: 2.9 MIL/uL — ABNORMAL LOW (ref 4.22–5.81)
RDW: 18.3 % — ABNORMAL HIGH (ref 11.5–15.5)
WBC: 11.4 10*3/uL — ABNORMAL HIGH (ref 4.0–10.5)
nRBC: 0 % (ref 0.0–0.2)

## 2019-09-27 LAB — BASIC METABOLIC PANEL
Anion gap: 8 (ref 5–15)
Anion gap: 5 (ref 5–15)
BUN: 40 mg/dL — ABNORMAL HIGH (ref 8–23)
BUN: 42 mg/dL — ABNORMAL HIGH (ref 8–23)
CO2: 18 mmol/L — ABNORMAL LOW (ref 22–32)
CO2: 20 mmol/L — ABNORMAL LOW (ref 22–32)
Calcium: 8.2 mg/dL — ABNORMAL LOW (ref 8.9–10.3)
Calcium: 8.3 mg/dL — ABNORMAL LOW (ref 8.9–10.3)
Chloride: 111 mmol/L (ref 98–111)
Chloride: 116 mmol/L — ABNORMAL HIGH (ref 98–111)
Creatinine, Ser: 1.92 mg/dL — ABNORMAL HIGH (ref 0.61–1.24)
Creatinine, Ser: 2.11 mg/dL — ABNORMAL HIGH (ref 0.61–1.24)
GFR calc Af Amer: 38 mL/min — ABNORMAL LOW (ref >60)
GFR calc Af Amer: 33 mL/min — ABNORMAL LOW (ref >60)
GFR calc non Af Amer: 32 mL/min — ABNORMAL LOW (ref >60)
GFR calc non Af Amer: 29 mL/min — ABNORMAL LOW (ref >60)
Glucose, Bld: 178 mg/dL — ABNORMAL HIGH (ref 70–99)
Glucose, Bld: 119 mg/dL — ABNORMAL HIGH (ref 70–99)
Potassium: 5.4 mmol/L — ABNORMAL HIGH (ref 3.5–5.1)
Potassium: 4.9 mmol/L (ref 3.5–5.1)
Sodium: 137 mmol/L (ref 135–145)
Sodium: 141 mmol/L (ref 135–145)

## 2019-09-27 LAB — LIPID PANEL
Cholesterol: 89 mg/dL (ref 0–200)
HDL: 32 mg/dL — ABNORMAL LOW (ref >40)
LDL Cholesterol: 48 mg/dL (ref 0–99)
Total CHOL/HDL Ratio: 2.8 RATIO
Triglycerides: 47 mg/dL (ref <150)
VLDL: 9 mg/dL (ref 0–40)

## 2019-09-27 LAB — PREPARE CRYOPRECIPITATE
Unit division: 0
Unit division: 0

## 2019-09-27 LAB — BPAM CRYOPRECIPITATE
Blood Product Expiration Date: 202104270340
Blood Product Expiration Date: 202104270340
ISSUE DATE / TIME: 202104262219
ISSUE DATE / TIME: 202104262219
Unit Type and Rh: 6200
Unit Type and Rh: 6200

## 2019-09-27 LAB — OSMOLALITY: Osmolality: 309 mOsm/kg — ABNORMAL HIGH (ref 275–295)

## 2019-09-27 LAB — MAGNESIUM: Magnesium: 1.9 mg/dL (ref 1.7–2.4)

## 2019-09-27 LAB — SODIUM, URINE, RANDOM: Sodium, Ur: 31 mmol/L

## 2019-09-27 LAB — ECHOCARDIOGRAM COMPLETE
Height: 68 in
Weight: 1880.08 oz

## 2019-09-27 LAB — HEMOGLOBIN A1C
Hgb A1c MFr Bld: 6.2 % — ABNORMAL HIGH (ref 4.8–5.6)
Mean Plasma Glucose: 131.24 mg/dL

## 2019-09-27 LAB — SODIUM
Sodium: 142 mmol/L (ref 135–145)
Sodium: 145 mmol/L (ref 135–145)

## 2019-09-27 LAB — OSMOLALITY, URINE: Osmolality, Ur: 467 mOsm/kg (ref 300–900)

## 2019-09-27 LAB — MRSA PCR SCREENING: MRSA by PCR: NEGATIVE

## 2019-09-27 LAB — FIBRINOGEN: Fibrinogen: 160 mg/dL — ABNORMAL LOW (ref 210–475)

## 2019-09-27 MED ORDER — FENTANYL CITRATE (PF) 100 MCG/2ML IJ SOLN
50.0000 ug | INTRAMUSCULAR | Status: DC | PRN
  Administered 2019-09-27 – 2019-09-28 (×4): 50 ug via INTRAVENOUS
  Filled 2019-09-27 (×5): qty 2

## 2019-09-27 MED ORDER — SENNOSIDES-DOCUSATE SODIUM 8.6-50 MG PO TABS: 1.0000 | ORAL_TABLET | Freq: Every evening | ORAL | Status: DC | PRN

## 2019-09-27 MED ORDER — POLYETHYLENE GLYCOL 3350 17 G PO PACK: 17.0000 g | PACK | Freq: Every day | ORAL | Status: DC

## 2019-09-27 MED ORDER — ORAL CARE MOUTH RINSE
15.0000 mL | OROMUCOSAL | Status: DC
  Administered 2019-09-27 – 2019-09-28 (×16): 15 mL via OROMUCOSAL

## 2019-09-27 MED ORDER — SODIUM CHLORIDE 3 % IV SOLN
INTRAVENOUS | Status: DC
  Administered 2019-09-27 – 2019-09-28 (×3): 75 mL/h via INTRAVENOUS
  Filled 2019-09-27 (×4): qty 500

## 2019-09-27 MED ORDER — INSULIN ASPART 100 UNIT/ML ~~LOC~~ SOLN
0.0000 [IU] | SUBCUTANEOUS | Status: DC
  Administered 2019-09-27: 10:00:00 2 [IU] via SUBCUTANEOUS
  Administered 2019-09-27: 12:00:00 3 [IU] via SUBCUTANEOUS
  Administered 2019-09-27: 1 [IU] via SUBCUTANEOUS
  Administered 2019-09-28 (×3): 2 [IU] via SUBCUTANEOUS

## 2019-09-27 MED ORDER — CLEVIDIPINE BUTYRATE 0.5 MG/ML IV EMUL
0.0000 mg/h | INTRAVENOUS | Status: DC
  Administered 2019-09-27: 23:00:00 3 mg/h via INTRAVENOUS
  Administered 2019-09-28: 05:00:00 2 mg/h via INTRAVENOUS
  Administered 2019-09-28: 08:00:00 8 mg/h via INTRAVENOUS
  Filled 2019-09-27 (×2): qty 50

## 2019-09-27 MED ORDER — DOCUSATE SODIUM 50 MG/5ML PO LIQD: 100.0000 mg | Freq: Two times a day (BID) | ORAL | Status: DC

## 2019-09-27 MED ORDER — CHLORHEXIDINE GLUCONATE CLOTH 2 % EX PADS
6.0000 | MEDICATED_PAD | Freq: Every day | CUTANEOUS | Status: DC
  Administered 2019-09-28 – 2019-09-30 (×4): 6 via TOPICAL

## 2019-09-27 MED ORDER — PERFLUTREN LIPID MICROSPHERE
1.0000 mL | INTRAVENOUS | Status: AC | PRN
  Administered 2019-09-27: 2 mL via INTRAVENOUS
  Filled 2019-09-27: qty 10

## 2019-09-27 MED ORDER — CHLORHEXIDINE GLUCONATE 0.12% ORAL RINSE (MEDLINE KIT)
15.0000 mL | Freq: Two times a day (BID) | OROMUCOSAL | Status: DC
  Administered 2019-09-27 – 2019-09-30 (×8): 15 mL via OROMUCOSAL

## 2019-09-27 NOTE — Progress Notes (Signed)
[facility]  Subjective  - remains intuabted   Objective (!) 119/56 (!) 57 (!) 97.3 F (36.3 C) (Axillary) 16 100%  Intake/Output Summary (Last 24 hours) at 09/27/2019 0751 Last data filed at 09/27/2019 0700 Gross per 24 hour  Intake 4085.85 ml  Output 1000 ml  Net 3085.85 ml    Left groin soft, palpable femoral pulse, no large appreciable hematoma Left DP palpable  Laboratory Lab Results: Recent Labs    09/18/2019 1832 09/15/2019 1832 09/25/2019 1833 09/16/2019 2155  WBC 7.4  --   --   --   HGB 8.4*   < > 8.5* 6.5*  HCT 27.6*   < > 25.0* 19.0*  PLT 146*  --   --   --    < > = values in this interval not displayed.   BMET Recent Labs    09/10/2019 1832 09/21/2019 1832 09/15/2019 1833 09/17/2019 1833 09/02/2019 2155 09/27/19 0501  NA 140   < > 139   < > 138 137  K 4.6   < > 4.5   < > 5.2* 5.4*  CL 109   < > 116*  --   --  111  CO2 20*  --   --   --   --  18*  GLUCOSE 156*   < > 145*  --   --  178*  BUN 41*   < > 36*  --   --  40*  CREATININE 2.20*   < > 2.00*  --   --  1.92*  CALCIUM 8.9  --   --   --   --  8.2*   < > = values in this interval not displayed.    COAG Lab Results  Component Value Date   INR 1.3 (H) 09/28/2019   INR 1.2 (H) 05/03/2019   INR 1.2 (H) 06/08/2018   No results found for: PTT  Assessment/Planning:  79 year old male status post left femoral access in IR for acute stroke intervention last night.  We were called last night to evaluate left groin bleed.  Ultimately TPA was reversed and sheath was removed with manual pressure for prolonged period at bedside.  Groin looks good this morning and has been stable.  No appreciable hematoma.  No RP bleed on CT.  Palpable DP pulse.  No indication for intervention from our standpoint.  I will sign off and please call with further questions or concerns.  I will let Dr. Trula Slade know the patient has been admitted.  Marty Heck 09/27/2019 7:51 AM --

## 2019-09-27 NOTE — Anesthesia Postprocedure Evaluation (Signed)
Anesthesia Post Note  Patient: SIMONE TUCKEY  Procedure(s) Performed: RADIOLOGY WITH ANESTHESIA (N/A )     Patient location during evaluation: ICU Anesthesia Type: General Level of consciousness: sedated and patient remains intubated per anesthesia plan Pain management: pain level controlled Vital Signs Assessment: post-procedure vital signs reviewed and stable Respiratory status: patient on ventilator - see flowsheet for VS and patient remains intubated per anesthesia plan Cardiovascular status: blood pressure returned to baseline and stable Postop Assessment: no apparent nausea or vomiting Anesthetic complications: no Comments: Post op hypotension/bleeding from sheath site is now stabilized    Last Vitals:  Vitals:   09/27/19 0700 09/27/19 0746  BP: 124/63 (!) 119/56  Pulse: (!) 58 (!) 57  Resp: 16 16  Temp:    SpO2: 100% 100%    Last Pain:  Vitals:   09/27/19 0400  TempSrc: Axillary                 Evie Croston,E. Shylo Dillenbeck

## 2019-09-27 NOTE — Progress Notes (Signed)
Updated Dr. Leonel Ramsay of neurology that although patient is having some urine output with an external catheter, a bladder scan showed greater than 600 mL in patient's bladder. New orders received to do an in and out catheter. Will continue to monitor.

## 2019-09-27 NOTE — Progress Notes (Signed)
SLP Cancellation Note  Patient Details Name: Stephen [name] MRN: [id number] DOB: 1940-11-08   Cancelled treatment:       Reason Eval/Treat Not Completed: Medical issues which prohibited therapy (on vent). Will f/u as able.    Osie Bond., M.A. [facility] Pager [phone] Office [phone]  09/27/2019, 8:02 AM

## 2019-09-27 NOTE — Progress Notes (Signed)
*   Echocardiogram 2D Echocardiogram with definity has been performed.  Darlina Sicilian M 09/27/2019, 10:55 AM

## 2019-09-27 NOTE — Progress Notes (Signed)
Initial Nutrition Assessment  DOCUMENTATION CODES:   Non-severe (moderate) malnutrition in context of chronic illness  INTERVENTION:   Recommend initiate  Vital High Protein @ 40 ml/hr (960 ml/day)  30 ml Prostat daily  Provides: 1060 kcal, 99 grams protein, and 802 ml free water.  TF regimen and cleviprex at current rate providing 1444 total kcal/day     NUTRITION DIAGNOSIS:   Moderate Malnutrition related to chronic illness as evidenced by mild fat depletion, moderate muscle depletion.  GOAL:   Patient will meet greater than or equal to 90% of their needs  MONITOR:   TF tolerance, Vent status  REASON FOR ASSESSMENT:   Ventilator    ASSESSMENT:   Pt with PMH of AAA s/p repair in 2019, hepatitis C, HTN, MI, s/p AKA 1965, CAD s/p 4 stents admitted with L MCA infarct s/p tPA and IR for revascularization.   Pt discussed during ICU rounds and with RN.  Per MD overnight pt had drop in BP, tPA reversed/blood transfusion.  Per RN plan to start nutrition tomorrow 4/28 per OG vs Cortrak tube  Patient is currently intubated on ventilator support MV: 8.8 L/min Temp (24hrs), Avg:97.8 F (36.6 C), Min:96.2 F (35.7 C), Max:98.5 F (36.9 C)  Cleviprex @ 8 ml/hr provides: 384 kcal  Medications reviewed and include: colace, miralax Hypertonic saline @ 75 ml/hr Labs reviewed: K+ 5.4 (H) CBG's: 151-114    NUTRITION - FOCUSED PHYSICAL EXAM:    Most Recent Value  Orbital Region  Mild depletion  Upper Arm Region  Unable to assess  Thoracic and Lumbar Region  Unable to assess  Buccal Region  Unable to assess  Temple Region  Mild depletion  Clavicle Bone Region  Moderate depletion  Clavicle and Acromion Bone Region  Moderate depletion  Scapular Bone Region  Unable to assess  Dorsal Hand  Unable to assess  Patellar Region  Moderate depletion  Anterior Thigh Region  Moderate depletion  Posterior Calf Region  Unable to assess  Edema (RD Assessment)  None  Hair  Reviewed   Eyes  Unable to assess  Mouth  Unable to assess  Skin  Reviewed  Nails  Reviewed       Diet Order:   Diet Order            Diet NPO time specified  Diet effective now              EDUCATION NEEDS:   No education needs have been identified at this time  Skin:  Skin Assessment: Reviewed RN Assessment  Last BM:  unknown  Height:   Ht Readings from Last 1 Encounters:  09/17/2019 5\' 8"  (1.727 m)    Weight:   Wt Readings from Last 1 Encounters:  09/06/2019 53.3 kg    Ideal Body Weight:  64.4 kg  BMI:  19.4  Estimated Nutritional Needs:   Kcal:  1402  Protein:  80-90 grams  Fluid:  >1.5 L/day  Lockie Pares., RD, LDN, CNSC See AMiON for contact information

## 2019-09-27 NOTE — Progress Notes (Signed)
Transported patient to and from CT without any incident.  Suctioned patient before and after trip.  Will continue to monitor.

## 2019-09-27 NOTE — Consult Note (Signed)
Dear Doctor:  This patient has been identified as a candidate for CVC for the following reason (s): IV therapy over 48 hours, drug extravasation potential with tissue necrosis (KCL, Dilantin, Dopamine, CaCl, MgSO4, chemo vesicant), poor veins/poor circulatory system (CHF, COPD, emphysema, diabetes, steroid use, IV drug abuse, etc.) and incompatible drugs (aminophyllin, TPN, heparin, given with an antibiotic) If you agree, please write an order for the indicated device.   Thank you for supporting the early vascular access assessment program.

## 2019-09-27 NOTE — Progress Notes (Signed)
OT Cancellation Note  Patient Details Name: Stephen [name] MRN: [id number] DOB: [birth year]   Cancelled Treatment:    Reason Eval/Treat Not Completed: Other (comment)(Awaiting confirmation for mobility with MD post tPA. )Will return as schedule allows.  [place], OTR/L Acute Rehab Pager: [phone] Office: [phone] 09/27/2019, 9:56 AM

## 2019-09-27 NOTE — Progress Notes (Signed)
Pt had not voided since last in and out catheter at 0300. Bladder scan revealed >200cc. Dr Lynetta Mare was notified. Attempted to in and out cath pt with two RN's. Was unable to get catheter all the way in the urethra d/t tension. Pt was also very uncomfortable and was bleeding at the site. Dr Lynetta Mare was notified and ordered for a foley to be placed. A 16 fr foley was placed with use of sterile technique and two other RN's present. Urine returned was noted. Output of 350cc. Louretta Parma, NP was notified.

## 2019-09-27 NOTE — Progress Notes (Addendum)
PT Cancellation Note  Patient Details Name: Stephen [name] MRN: [id number] DOB: 12/13/1940   Cancelled Treatment:    Reason Eval/Treat Not Completed: Per Ivin Booty, NP wait until tomorrow to progress mobility as pt remains in TPA window and went to IR. Acute PT to return as able, as appropriate.  Kittie Plater, PT, DPT Acute Rehabilitation Services Pager #: [phone] Office #: [phone]   Kittie Plater, PT, DPT Acute Rehabilitation Services Pager #: [phone] Office #: [phone]    Berline Lopes 09/27/2019, 8:21 AM

## 2019-09-27 NOTE — Progress Notes (Signed)
RT transported patient from 4N26 to MRI and back with RN. No complications. RT will continue to monitor.

## 2019-09-27 NOTE — Progress Notes (Signed)
Referring Physician(s): Code Stroke- Stephen [name]  Supervising Physician: Stephen [name]  Patient Status:  [facility] - In-pt  Chief Complaint: None- intubated with sedation  Subjective:  History of CVA s/p cerebral arteriogram with emergent mechanical thrombectomy of left MCA M1 occlusion achieving a TICI 3 revascularization 09/14/2019 by Stephen [name]. Patient laying in bed intubated with sedation. He opens eyes to voice but does not follow simple commands. Can spontaneously move left side with no spontaneous movements of right side. Left gaze preference. Left groin incision c/d/i.  CT abdomen/pelvis 09/27/2019: 1. Expected post catheterization changes in the left groin. No retroperitoneal hematoma. 2. Partially covered high-density thickening of the right chest wall. No history of trauma but spontaneous hematomas can rarely occur in this location. This could be the site of blood loss and chest CT could evaluate the extent. 3. Aortic aneurysm treated with stent graft. The aneurysm sac measures 3 mm larger than August 19, 2019 CT, recommend follow-up.   Allergies: Patient has no known allergies.  Medications: Prior to Admission medications   Medication Sig Start Date End Date Taking? Authorizing Provider  aspirin EC 81 MG tablet Take 81 mg by mouth daily after supper.    Yes [provider]  atorvastatin (LIPITOR) 40 MG tablet TAKE 1 TABLET BY MOUTH EVERY DAY Patient taking differently: Take 40 mg by mouth daily after supper.  04/20/19  Yes Stephen Sine, MD  carvedilol (COREG) 12.5 MG tablet Take 1 tablet (12.5 mg total) by mouth 2 (two) times daily. 09/07/19  Yes Stephen Deforest, PA  ezetimibe (ZETIA) 10 MG tablet TAKE 1 TABLET BY MOUTH EVERY DAY Patient taking differently: Take 10 mg by mouth daily after supper.  05/09/19  Yes Stephen Sine, MD  Multiple Vitamin (MULITIVITAMIN WITH MINERALS) TABS Take 1 tablet by mouth daily with breakfast.    Yes [provider]  nitroGLYCERIN (NITROSTAT) 0.4 MG SL tablet Place 1 tablet (0.4 mg total) under the tongue every 5 (five) minutes as needed for chest pain. 09/07/19 04/18/24 Yes Stephen Deforest, PA     Vital Signs: BP 125/69   Pulse 62   Temp (!) 97.4 F (36.3 C) (Axillary)   Resp 10   Ht 5\' 8"  (1.727 m)   Wt 117 lb 8.1 oz (53.3 kg)   SpO2 100%   BMI 17.87 kg/m   Physical Exam Vitals and nursing note reviewed.  Constitutional:      General: He is not in acute distress.    Comments: Intubated with sedation.  Pulmonary:     Effort: Pulmonary effort is normal. No respiratory distress.     Comments: Intubated with sedation. Skin:    General: Skin is warm and dry.     Comments: Left groin incision soft without active bleeding or hematoma.  Neurological:     Comments: Intubated with sedation. He opens eyes to voice but does not follow simple commands. PERRL bilaterally. Left gaze preference. Can spontaneously move left side with no spontaneous movements of right side. Distal pulses 2+ bilaterally (left DP and right femoral).     Imaging: CT ABDOMEN PELVIS WO CONTRAST  Result Date: 09/27/2019 CLINICAL DATA:  Hypotension and shock. Groin hematoma after catheterization. EXAM: CT ABDOMEN AND PELVIS WITHOUT CONTRAST TECHNIQUE: Multidetector CT imaging of the abdomen and pelvis was performed following the standard protocol without IV contrast. COMPARISON:  08/19/2019 FINDINGS: Lower chest: High-density thickening asymmetrically along the right chest wall deep to the serratus anterior and latissimus dorsi. There  is no history of chest wall trauma or swelling. Mild atelectasis at the left base. Coronary atherosclerosis. Hepatobiliary: No focal liver abnormality.Vicarious excretion of contrast. No evidence of biliary obstruction Pancreas: Unremarkable. Spleen: Unremarkable. Adrenals/Urinary Tract: Negative adrenals. Left renal sinus cyst. There is asymmetric renal cortical density but I am uncertain  which side is abnormal. On the right there could be delayed excretion on the left there could be poor renal cortical uptake. Unremarkable bladder. Stomach/Bowel: No obstruction. No evidence of bowel inflammation. Left colonic diverticulosis. Vascular/Lymphatic: Extensive atherosclerosis. There is an infrarenal to iliac abdominal aortic aneurysm status post stent grafting. When remeasured in a similar fashion distal aortic aneurysm sac measures larger (on coronal reformats sac diameter is 7.9 cm as compared to 7.6 cm previously. Mild stranding at the left groin which is expected. No retroperitoneal hematoma. Reproductive:Symmetrically enlarged prostate Other: No ascites or pneumoperitoneum. Musculoskeletal: Asymmetric osteopenia and muscular atrophy after right leg amputation. No acute or aggressive finding. Case discussed with Dr. Leonel [name]. IMPRESSION: 1. Expected post catheterization changes in the left groin. No retroperitoneal hematoma. 2. Partially covered high-density thickening of the right chest wall. No history of trauma but spontaneous hematomas can rarely occur in this location. This could be the site of blood loss and chest CT could evaluate the extent. 3. Aortic aneurysm treated with stent graft. The aneurysm sac measures 3 mm larger than August 19, 2019 CT, recommend follow-up. Electronically Signed   By: Stephen [name] M.D.   On: 09/27/2019 05:25   CT ANGIO HEAD W OR WO CONTRAST  Result Date: 09/02/2019 CLINICAL DATA:  Stroke, follow-up. 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 Multiplanar CT image reconstructions and MIPs were obtained to evaluate the vascular anatomy. Carotid stenosis measurements (when applicable) are obtained utilizing NASCET criteria, using the distal internal  carotid diameter as the denominator. CONTRAST:  75 mL Omnipaque 350 intravenous contrast. COMPARISON:  Concurrently performed noncontrast head CT 09/21/2019. FINDINGS: CTA NECK FINDINGS Aortic arch: Standard aortic branching. Atherosclerotic plaque within the visualized aortic arch and proximal major branch vessels of the neck. No significant innominate or proximal subclavian artery stenosis. Right carotid system: CCA and ICA patent within the neck without significant stenosis (50% or greater). Minimal calcified plaque within the carotid bifurcation. Left carotid system: CCA and ICA patent within the neck without significant stenosis (50% or greater). Minimal calcified plaque within the carotid bifurcation and proximal ICA. Vertebral arteries: The vertebral arteries are patent within the neck bilaterally without significant stenosis (50% or greater). The left vertebral artery is dominant. Mild calcified plaque within both V1 segments and within the left V2 segment. Skeleton: No acute bony abnormality or aggressive osseous lesion. Cervical spondylosis with multilevel disc height loss, posterior disc osteophytes, uncovertebral and facet hypertrophy. Other neck: No neck mass or cervical lymphadenopathy. Upper chest: No consolidation within the imaged lung apices. Review of the MIP images confirms the above findings CTA HEAD FINDINGS Anterior circulation: The intracranial internal carotid arteries are patent bilaterally. Mild calcified plaque within these vessels without significant stenosis. There is abrupt occlusion of the proximal to mid M1 left middle cerebral artery (series 10, image 19). There is poor reconstitution of the more distal left MCA branches. The M1 right middle cerebral artery is patent without significant stenosis. No right M2 proximal branch occlusion or high-grade proximal stenosis is identified. The  anterior cerebral arteries are patent bilaterally. Moderate focal stenosis within a left A3 ACA  branch vessel. No intracranial aneurysm is identified Posterior circulation: The intracranial vertebral arteries are patent without significant stenosis, as is the basilar artery. The posterior cerebral arteries are patent bilaterally without significant proximal stenosis. A right posterior communicating artery is identified. Hypoplastic or absent left posterior communicating artery. Venous sinuses: Within limitations of contrast timing, no convincing thrombus. Anatomic variants: Hypoplastic A1 left ACA. Review of the MIP images confirms the above findings Findings of an M1 left MCA occlusion discussed with Dr. Lorraine Lax by telephone at 6:47 p.m. on 09/13/2019, who verbally acknowledged these results. IMPRESSION: CTA neck: The common carotid, internal carotid and vertebral arteries are patent within the neck without hemodynamically significant stenosis. Atherosclerotic disease as described. CTA head: 1. Abrupt occlusion of the proximal/mid M1 left middle cerebral artery. There is poor opacification of the left MCA branch vessels more distally. 2. Moderate focal atherosclerotic narrowing of an A3 left ACA branch vessel. Electronically Signed   By: Kellie Simmering DO   On: 09/09/2019 19:09   CT ANGIO NECK W OR WO CONTRAST  Result Date: 09/08/2019 CLINICAL DATA:  Stroke, follow-up. 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 Multiplanar CT image reconstructions and MIPs were obtained to evaluate the vascular anatomy. Carotid stenosis measurements (when applicable) are obtained utilizing NASCET criteria, using the distal internal carotid diameter as the denominator. CONTRAST:  75 mL Omnipaque 350 intravenous contrast. COMPARISON:  Concurrently performed noncontrast head CT 09/02/2019. FINDINGS: CTA NECK FINDINGS Aortic  arch: Standard aortic branching. Atherosclerotic plaque within the visualized aortic arch and proximal major branch vessels of the neck. No significant innominate or proximal subclavian artery stenosis. Right carotid system: CCA and ICA patent within the neck without significant stenosis (50% or greater). Minimal calcified plaque within the carotid bifurcation. Left carotid system: CCA and ICA patent within the neck without significant stenosis (50% or greater). Minimal calcified plaque within the carotid bifurcation and proximal ICA. Vertebral arteries: The vertebral arteries are patent within the neck bilaterally without significant stenosis (50% or greater). The left vertebral artery is dominant. Mild calcified plaque within both V1 segments and within the left V2 segment. Skeleton: No acute bony abnormality or aggressive osseous lesion. Cervical spondylosis with multilevel disc height loss, posterior disc osteophytes, uncovertebral and facet hypertrophy. Other neck: No neck mass or cervical lymphadenopathy. Upper chest: No consolidation within the imaged lung apices. Review of the MIP images confirms the above findings CTA HEAD FINDINGS Anterior circulation: The intracranial internal carotid arteries are patent bilaterally. Mild calcified plaque within these vessels without significant stenosis. There is abrupt occlusion of the proximal to mid M1 left middle cerebral artery (series 10, image 19). There is poor reconstitution of the more distal left MCA branches. The M1 right middle cerebral artery is patent without significant stenosis. No right M2 proximal branch occlusion or high-grade proximal stenosis is identified. The anterior cerebral arteries are patent bilaterally. Moderate focal stenosis within a left A3 ACA branch vessel. No intracranial aneurysm is identified Posterior circulation: The intracranial vertebral arteries are patent without significant stenosis, as is the basilar artery. The posterior  cerebral arteries are patent bilaterally without significant proximal stenosis. A right posterior communicating artery is identified. Hypoplastic or absent left posterior communicating artery. Venous sinuses: Within limitations of contrast timing,  no convincing thrombus. Anatomic variants: Hypoplastic A1 left ACA. Review of the MIP images confirms the above findings Findings of an M1 left MCA occlusion discussed with Dr. Lorraine Lax by telephone at 6:47 p.m. on 09/04/2019, who verbally acknowledged these results. IMPRESSION: CTA neck: The common carotid, internal carotid and vertebral arteries are patent within the neck without hemodynamically significant stenosis. Atherosclerotic disease as described. CTA head: 1. Abrupt occlusion of the proximal/mid M1 left middle cerebral artery. There is poor opacification of the left MCA branch vessels more distally. 2. Moderate focal atherosclerotic narrowing of an A3 left ACA branch vessel. Electronically Signed   By: Kellie Simmering DO   On: 09/19/2019 19:09   Portable Chest xray  Result Date: 09/27/2019 CLINICAL DATA:  Stroke, intubation EXAM: PORTABLE CHEST 1 VIEW COMPARISON:  Portable exam 0542 hours compared to 09/11/2017 FINDINGS: Tip of endotracheal tube projects 3.9 cm above carina. Normal heart size, mediastinal contours, and pulmonary vascularity. Atherosclerotic calcification aorta. Chronic mild atelectasis versus scarring at LEFT base. Remaining lungs clear. No pleural effusion or pneumothorax. Bones demineralized. IMPRESSION: Satisfactory endotracheal tube position. Chronic mild atelectasis versus scarring at LEFT base without acute abnormalities. Electronically Signed   By: Lavonia Dana M.D.   On: 09/27/2019 08:35   CT HEAD CODE STROKE WO CONTRAST  Result Date: 09/27/2019 CLINICAL DATA:  Code stroke. EXAM: CT HEAD WITHOUT CONTRAST TECHNIQUE: Contiguous axial images were obtained from the base of the skull through the vertex without intravenous contrast.  COMPARISON:  Noncontrast head CT 04/13/2014, brain MRI 01/04/2014 FINDINGS: Brain: There is abnormal hypodensity involving the left insula, left caudate and lentiform nuclei as well as left temporal lobe consistent with acute ischemic infarction changes. There is no significant mass effect or evidence of hemorrhagic conversion. No acute intracranial hemorrhage is identified. Redemonstrated small chronic cortically based infarct within the left frontal operculum. Additional small chronic appearing cortically based infarcts within the right parietal and left occipital lobes. Redemonstrated chronic infarcts within the left cerebellar hemisphere. Stable background generalized parenchymal atrophy and chronic small vessel ischemic disease. No extra-axial fluid collection. No evidence of intracranial mass. No midline shift. Vascular: Hyperdensity in the region of the M1 left middle cerebral artery suggestive of thrombus. Skull: Normal. Negative for fracture or focal lesion. Sinuses/Orbits: Visualized orbits show no acute finding. No significant sinus disease or mastoid effusion at the imaged levels. ASPECTS ([place] Stroke Program Early CT Score) - Ganglionic level infarction (caudate, lentiform nuclei, internal capsule, insula, M1-M3 cortex): Six (points deducted for abnormal hypodensity involving the left caudate nucleus, lentiform nucleus, insula and M2 territory) - Supraganglionic infarction (M4-M6 cortex): 3 Total score (0-10 with 10 being normal): 6 These results were called by telephone at the time of interpretation on 09/03/2019 at 6:47 pm to provider Dr. Lorraine Lax, who verbally acknowledged these results. IMPRESSION: 1. Hyperdensity of the M1 left middle cerebral artery suspicious for thrombus. 2. Acute ischemic infarction changes within the left MCA vascular territory. ASPECTS 6. 3. No evidence of acute intracranial hemorrhage. 4. Multiple small chronic cortically based infarcts within the bilateral cerebral  hemispheres. 5. Redemonstrated chronic infarcts within the left cerebellum. 6. Stable background generalized parenchymal atrophy and chronic small vessel ischemic disease. Electronically Signed   By: Kellie Simmering DO   On: 09/13/2019 18:52    Labs:  CBC: Recent Labs    08/30/19 1025 08/30/19 1025 09/06/19 1018 09/06/19 1018 09/21/2019 1832 09/08/2019 1833 09/16/2019 2155 09/27/19 0501  WBC 5.3  --  5.5  --  7.4  --   --  10.4  HGB 7.3 Repeated and verified X2.*   < > 8.1 Repeated and verified X2.*   < > 8.4* 8.5* 6.5* 10.2*  HCT 21.7 Repeated and verified X2.*   < > 24.2 Repeated and verified X2.*   < > 27.6* 25.0* 19.0* 31.4*  PLT 222.0  --  316.0  --  146*  --   --  PLATELET CLUMPS NOTED ON SMEAR, UNABLE TO ESTIMATE   < > = values in this interval not displayed.    COAGS: Recent Labs    05/03/19 0848 09/23/2019 1832  INR 1.2* 1.3*  APTT  --  34    BMP: Recent Labs    08/22/19 0745 08/22/19 0745 08/23/19 0310 08/23/19 0310 08/30/19 1025 08/30/19 1025 09/06/19 1018 09/06/19 1018 09/20/2019 1832 09/11/2019 1833 09/29/2019 2155 09/27/19 0501  NA 144   < > 141   < > 136   < > 137   < > 140 139 138 137  K 4.9   < > 4.9   < > 5.4*   < > 4.9   < > 4.6 4.5 5.2* 5.4*  CL 114*   < > 118*   < > 106   < > 108  --  109 116*  --  111  CO2 18*   < > 17*   < > 25  --  22  --  20*  --   --  18*  GLUCOSE 138*   < > 104*   < > 101*   < > 150*  --  156* 145*  --  178*  BUN 47*   < > 41*   < > 42*   < > 44*  --  41* 36*  --  40*  CALCIUM 7.9*   < > 7.8*   < > 8.1*  --  8.2*  --  8.9  --   --  8.2*  CREATININE 2.48*   < > 2.39*   < > 2.28*   < > 2.67*  --  2.20* 2.00*  --  1.92*  GFRNONAA 24*  --  25*  --   --   --   --   --  27*  --   --  32*  GFRAA 28*  --  29*  --   --   --   --   --  32*  --   --  38*   < > = values in this interval not displayed.    LIVER FUNCTION TESTS: Recent Labs    08/16/19 1215 08/16/19 1215 08/19/19 2144 08/21/19 0313 08/23/19 0310 08/30/19 1025  09/06/19 1018 09/17/2019 1832  BILITOT 0.7  --  0.7  --   --  0.5  --  0.6  AST 113*  --  64*  --   --  33  --  22  ALT 117*  --  80*  --   --  39  --  21  ALKPHOS 104  --  98  --   --  67  --  76  PROT 5.6*  --  6.1*  --   --  5.8*  --  6.7  ALBUMIN 2.7*   < > 2.1*   < > 1.7* 2.5* 2.8* 2.9*   < > = values in this interval not displayed.    Assessment and Plan:  History of CVA s/p cerebral arteriogram with emergent mechanical thrombectomy of left MCA M1 occlusion achieving a TICI 3 revascularization  09/20/2019 by Stephen [name]. Patient's condition stable- intubated/sedated, opens eyes to voice, does not follow simple commands, can spontaneously move left side with no spontaneous movements of right side. Left groin incision stable, CT abdomen/pelvis revealed no retroperitoneal hematoma. Further plans per neurology/CCM/vascular surgery- appreciate and agree with management. NIR to follow.   Electronically Signed: Earley Abide, PA-C 09/27/2019, 9:50 AM   I spent a total of 25 Minutes at the the patient's bedside AND on the patient's hospital floor or unit, greater than 50% of which was counseling/coordinating care for left MCA M1 occlusion s/p revascularization.

## 2019-09-27 NOTE — Progress Notes (Signed)
Left groin has remained stable since sheath pull and manual pressure.  TXA has also been administered.  Left groin soft. Palpable femoral pulse.  Marty Heck, MD [facility] Office: [place]
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
## 2019-09-27 NOTE — Progress Notes (Signed)
Sheath removed @ 2200 by Dr Estanislado Pandy. Manual pressure being held at site.
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
## 2019-09-27 NOTE — Social Work (Signed)
CSW was unable to complete sbirt due to pt being on the ventilator. CSW may attempt to complete at more appropriate time.   Emeterio Reeve, Latanya Presser, [place] Social Worker [phone]

## 2019-09-28 ENCOUNTER — Inpatient Hospital Stay (HOSPITAL_COMMUNITY): Payer: Medicare Other

## 2019-09-28 DIAGNOSIS — I639 Cerebral infarction, unspecified: Secondary | ICD-10-CM

## 2019-09-28 DIAGNOSIS — I63512 Cerebral infarction due to unspecified occlusion or stenosis of left middle cerebral artery: Secondary | ICD-10-CM | POA: Diagnosis not present

## 2019-09-28 DIAGNOSIS — E440 Moderate protein-calorie malnutrition: Secondary | ICD-10-CM | POA: Insufficient documentation

## 2019-09-28 LAB — CBC
HCT: 23.7 % — ABNORMAL LOW (ref 39.0–52.0)
Hemoglobin: 7.5 g/dL — ABNORMAL LOW (ref 13.0–17.0)
MCH: 30.4 pg (ref 26.0–34.0)
MCHC: 31.6 g/dL (ref 30.0–36.0)
MCV: 96 fL (ref 80.0–100.0)
Platelets: 89 10*3/uL — ABNORMAL LOW (ref 150–400)
RBC: 2.47 MIL/uL — ABNORMAL LOW (ref 4.22–5.81)
RDW: 18.9 % — ABNORMAL HIGH (ref 11.5–15.5)
WBC: 11.5 10*3/uL — ABNORMAL HIGH (ref 4.0–10.5)
nRBC: 0 % (ref 0.0–0.2)

## 2019-09-28 LAB — BPAM RBC
Blood Product Expiration Date: 202105252359
Blood Product Expiration Date: 202105252359
ISSUE DATE / TIME: 202104270004
ISSUE DATE / TIME: 202104270004
Unit Type and Rh: 6200
Unit Type and Rh: 6200

## 2019-09-28 LAB — TYPE AND SCREEN
ABO/RH(D): A POS
Antibody Screen: NEGATIVE
Unit division: 0
Unit division: 0

## 2019-09-28 LAB — GLUCOSE, CAPILLARY
Glucose-Capillary: 128 mg/dL — ABNORMAL HIGH (ref 70–99)
Glucose-Capillary: 135 mg/dL — ABNORMAL HIGH (ref 70–99)
Glucose-Capillary: 128 mg/dL — ABNORMAL HIGH (ref 70–99)

## 2019-09-28 LAB — BASIC METABOLIC PANEL
Anion gap: 8 (ref 5–15)
BUN: 42 mg/dL — ABNORMAL HIGH (ref 8–23)
CO2: 16 mmol/L — ABNORMAL LOW (ref 22–32)
Calcium: 8.2 mg/dL — ABNORMAL LOW (ref 8.9–10.3)
Chloride: 128 mmol/L — ABNORMAL HIGH (ref 98–111)
Creatinine, Ser: 1.94 mg/dL — ABNORMAL HIGH (ref 0.61–1.24)
GFR calc Af Amer: 37 mL/min — ABNORMAL LOW (ref >60)
GFR calc non Af Amer: 32 mL/min — ABNORMAL LOW (ref >60)
Glucose, Bld: 143 mg/dL — ABNORMAL HIGH (ref 70–99)
Potassium: 4.3 mmol/L (ref 3.5–5.1)
Sodium: 152 mmol/L — ABNORMAL HIGH (ref 135–145)

## 2019-09-28 LAB — PHOSPHORUS: Phosphorus: 3.5 mg/dL (ref 2.5–4.6)

## 2019-09-28 LAB — MAGNESIUM: Magnesium: 2 mg/dL (ref 1.7–2.4)

## 2019-09-28 LAB — SODIUM: Sodium: 159 mmol/L — ABNORMAL HIGH (ref 135–145)

## 2019-09-28 MED ORDER — GLYCOPYRROLATE 0.2 MG/ML IJ SOLN: 0.2000 mg | INTRAMUSCULAR | Status: DC | PRN

## 2019-09-28 MED ORDER — LORAZEPAM 2 MG/ML PO CONC: 1.0000 mg | ORAL | Status: DC | PRN

## 2019-09-28 MED ORDER — DIPHENHYDRAMINE HCL 50 MG/ML IJ SOLN: 12.5000 mg | INTRAMUSCULAR | Status: DC | PRN

## 2019-09-28 MED ORDER — LORAZEPAM 2 MG/ML IJ SOLN: 1.0000 mg | INTRAMUSCULAR | Status: DC | PRN

## 2019-09-28 MED ORDER — GLYCOPYRROLATE 1 MG PO TABS
1.0000 mg | ORAL_TABLET | ORAL | Status: DC | PRN
  Filled 2019-09-28: qty 1

## 2019-09-28 MED ORDER — ONDANSETRON HCL 4 MG/2ML IJ SOLN: 4.0000 mg | Freq: Four times a day (QID) | INTRAMUSCULAR | Status: DC | PRN

## 2019-09-28 MED ORDER — CLEVIDIPINE BUTYRATE 0.5 MG/ML IV EMUL
0.0000 mg/h | INTRAVENOUS | Status: DC
  Administered 2019-09-28: 14:00:00 6 mg/h via INTRAVENOUS
  Filled 2019-09-28: qty 50

## 2019-09-28 MED ORDER — SODIUM CHLORIDE 3 % IV SOLN
INTRAVENOUS | Status: DC
  Filled 2019-09-28 (×2): qty 500

## 2019-09-28 MED ORDER — ORAL CARE MOUTH RINSE
15.0000 mL | Freq: Two times a day (BID) | OROMUCOSAL | Status: DC
  Administered 2019-09-28 – 2019-09-30 (×4): 15 mL via OROMUCOSAL

## 2019-09-28 MED ORDER — ONDANSETRON 4 MG PO TBDP: 4.0000 mg | ORAL_TABLET | Freq: Four times a day (QID) | ORAL | Status: DC | PRN

## 2019-09-28 MED ORDER — SODIUM CHLORIDE 3 % IV BOLUS
250.0000 mL | Freq: Once | INTRAVENOUS | Status: AC
  Administered 2019-09-28: 10:00:00 250 mL via INTRAVENOUS
  Filled 2019-09-28: qty 250

## 2019-09-28 MED ORDER — MORPHINE 100MG IN NS 100ML (1MG/ML) PREMIX INFUSION
0.0000 mg/h | INTRAVENOUS | Status: DC
  Administered 2019-09-28: 15:00:00 1 mg/h via INTRAVENOUS
  Administered 2019-09-29: 14:00:00 4 mg/h via INTRAVENOUS
  Filled 2019-09-28 (×2): qty 100

## 2019-09-28 MED ORDER — GLYCOPYRROLATE 0.2 MG/ML IJ SOLN
0.2000 mg | INTRAMUSCULAR | Status: DC | PRN
  Administered 2019-09-28: 16:00:00 0.2 mg via INTRAVENOUS
  Filled 2019-09-28: qty 1

## 2019-09-28 MED ORDER — LORAZEPAM 1 MG PO TABS: 1.0000 mg | ORAL_TABLET | ORAL | Status: DC | PRN
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
## 2019-09-28 NOTE — Significant Event (Signed)
Patient now transitioned to comfort care. Critical Care to sign off at this time.

## 2019-09-28 NOTE — Progress Notes (Signed)
Transferred patient to and from CT without any complications.  Will continue to monitor.
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
## 2019-09-28 NOTE — Progress Notes (Signed)
Received pt unresponsive. Pt with foley in place. Repositioned pt comfortably.

## 2019-09-28 NOTE — Progress Notes (Signed)
Palliative:  Consult received and chart reviewed. Discussed with RN who shares Dr. Leonie Man is meeting with family to explain prognosis. Family opted for comfort care - no further palliative needs per RN.   Please call if further assistance is needed.  Juel Burrow, DNP, AGNP-C Palliative Medicine Team Team Phone # [phone]  Pager # [phone]  NO CHARGE

## 2019-09-28 NOTE — Patient Care Conference (Signed)
Updated status after discussion with family members   RT extubated patient to comfort per MD order and wishes of family. Rn and NP at bedside.

## 2019-09-28 NOTE — Progress Notes (Signed)
RT extubated patient to comfort per MD order and family wishes. RN and NP at bedside.
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
## 2019-09-28 NOTE — Progress Notes (Signed)
Referring Physician(s): Code Stroke- [name]  Supervising Physician: Luanne Bras  Patient Status:  [facility] - In-pt  Chief Complaint: None- intubated with sedation  Subjective:  History of CVA s/p cerebral arteriogram with emergent mechanical thrombectomy of left MCA M1 occlusion achieving a TICI 3 revascularization 09/06/2019 by Dr. Estanislado Pandy. Patient laying in bed intubated with sedation. He opens eyes to voice, follows some simple commands (moves LUE to command only). Can spontaneously move left side with no spontaneous movements of right side. Left gaze preference. Left groin incision c/d/i.  CT head this AM: 1. A large heterogeneous intraparenchymal hemorrhage within the left cerebral hemisphere in the left MCA vascular territory is stable to slightly increased in size as compared to MRI 09/27/2019. Fairly extensive surrounding edema within the left MCA vascular territory. 2. Increased mass effect with significant effacement of the left lateral and third ventricles. Rightward midline shift has increased, now 11 mm (previously 9 mm). There is early rightward subfalcine herniation. There is also partial effacement of the basal cisterns on the left. 3. Unchanged moderate volume intraventricular hemorrhage within the occipital horns. 4. Mild asymmetric prominence of the right lateral ventricle. Consider short interval CT follow-up to exclude developing entrapment. 5. Redemonstrated scattered small volume subarachnoid hemorrhage overlying the bilateral cerebral hemispheres and along the left cerebellar folia.  MR brain 09/27/2019: 1. Positive for Malignant Hemorrhagic Transformation of Left MCA infarct. Bulky and confluent intra-axial hemorrhage with a moderate volume of intraventricular extension. No ventriculomegaly. 2. Rightward midline shift of 9 mm.  Basilar cisterns remain normal. 3. Small volume superimposed subarachnoid hemorrhage. No acute subdural blood  identified. 4. 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 5. Chronic ischemic disease in the left cerebellum.   Allergies: Patient has no known allergies.  Medications: Prior to Admission medications   Medication Sig Start Date End Date Taking? Authorizing Provider  aspirin EC 81 MG tablet Take 81 mg by mouth daily after supper.    Yes [provider]  atorvastatin (LIPITOR) 40 MG tablet TAKE 1 TABLET BY MOUTH EVERY DAY Patient taking differently: Take 40 mg by mouth daily after supper.  04/20/19  Yes Troy Sine, MD  carvedilol (COREG) 12.5 MG tablet Take 1 tablet (12.5 mg total) by mouth 2 (two) times daily. 09/07/19  Yes Almyra Deforest, PA  ezetimibe (ZETIA) 10 MG tablet TAKE 1 TABLET BY MOUTH EVERY DAY Patient taking differently: Take 10 mg by mouth daily after supper.  05/09/19  Yes Troy Sine, MD  Multiple Vitamin (MULITIVITAMIN WITH MINERALS) TABS Take 1 tablet by mouth daily with breakfast.    Yes [provider]  nitroGLYCERIN (NITROSTAT) 0.4 MG SL tablet Place 1 tablet (0.4 mg total) under the tongue every 5 (five) minutes as needed for chest pain. 09/07/19 04/18/24 Yes Almyra Deforest, PA     Vital Signs: BP (!) 143/69 (BP Location: Right Arm) Comment: titrated cleviprex  Pulse 90   Temp 98.6 F (37 C) (Axillary)   Resp 13   Ht 5\' 8"  (1.727 m)   Wt 117 lb 8.1 oz (53.3 kg)   SpO2 100%   BMI 17.87 kg/m   Physical Exam Vitals and nursing note reviewed.  Constitutional:      General: He is not in acute distress.    Comments: Intubated with sedation.  Pulmonary:     Effort: Pulmonary effort is normal. No respiratory distress.     Comments: Intubated with sedation. Skin:  General: Skin is warm and dry.     Comments: Left groin incision soft without active bleeding or hematoma.  Neurological:     Comments: Intubated with sedation. He opens eyes to voice,  follows some simple commands (moves LUE to  command only). PERRL bilaterally. Left gaze preference. Can spontaneously move left side with no spontaneous movements of right side. Distal pulses 2+ bilaterally (left DP and right femoral).      Imaging: CT ABDOMEN PELVIS WO CONTRAST  Result Date: 09/27/2019 CLINICAL DATA:  Hypotension and shock. Groin hematoma after catheterization. EXAM: CT ABDOMEN AND PELVIS WITHOUT CONTRAST TECHNIQUE: Multidetector CT imaging of the abdomen and pelvis was performed following the standard protocol without IV contrast. COMPARISON:  08/19/2019 FINDINGS: Lower chest: High-density thickening asymmetrically along the right chest wall deep to the serratus anterior and latissimus dorsi. There is no history of chest wall trauma or swelling. Mild atelectasis at the left base. Coronary atherosclerosis. Hepatobiliary: No focal liver abnormality.Vicarious excretion of contrast. No evidence of biliary obstruction Pancreas: Unremarkable. Spleen: Unremarkable. Adrenals/Urinary Tract: Negative adrenals. Left renal sinus cyst. There is asymmetric renal cortical density but I am uncertain which side is abnormal. On the right there could be delayed excretion on the left there could be poor renal cortical uptake. Unremarkable bladder. Stomach/Bowel: No obstruction. No evidence of bowel inflammation. Left colonic diverticulosis. Vascular/Lymphatic: Extensive atherosclerosis. There is an infrarenal to iliac abdominal aortic aneurysm status post stent grafting. When remeasured in a similar fashion distal aortic aneurysm sac measures larger (on coronal reformats sac diameter is 7.9 cm as compared to 7.6 cm previously. Mild stranding at the left groin which is expected. No retroperitoneal hematoma. Reproductive:Symmetrically enlarged prostate Other: No ascites or pneumoperitoneum. Musculoskeletal: Asymmetric osteopenia and muscular atrophy after right leg amputation. No acute or aggressive finding. Case discussed with Dr. Leonel Ramsay.  IMPRESSION: 1. Expected post catheterization changes in the left groin. No retroperitoneal hematoma. 2. Partially covered high-density thickening of the right chest wall. No history of trauma but spontaneous hematomas can rarely occur in this location. This could be the site of blood loss and chest CT could evaluate the extent. 3. Aortic aneurysm treated with stent graft. The aneurysm sac measures 3 mm larger than August 19, 2019 CT, recommend follow-up. Electronically Signed   By: Monte Fantasia M.D.   On: 09/27/2019 05:25   CT ANGIO HEAD W OR WO CONTRAST  Result Date: 09/18/2019 CLINICAL DATA:  Stroke, follow-up. 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 Multiplanar CT image reconstructions and MIPs were obtained to evaluate the vascular anatomy. Carotid stenosis measurements (when applicable) are obtained utilizing NASCET criteria, using the distal internal carotid diameter as the denominator. CONTRAST:  75 mL Omnipaque 350 intravenous contrast. COMPARISON:  Concurrently performed noncontrast head CT 09/29/2019. FINDINGS: CTA NECK FINDINGS Aortic arch: Standard aortic branching. Atherosclerotic plaque within the visualized aortic arch and proximal major branch vessels of the neck. No significant innominate or proximal subclavian artery stenosis. Right carotid system: CCA and ICA patent within the neck without significant stenosis (50% or greater). Minimal calcified plaque within the carotid bifurcation. Left carotid system: CCA and ICA patent within the neck without significant stenosis (50% or greater). Minimal calcified plaque within the carotid bifurcation and proximal ICA. Vertebral arteries: The vertebral arteries are patent within the neck bilaterally without significant stenosis (50% or  greater).  The left vertebral artery is dominant. Mild calcified plaque within both V1 segments and within the left V2 segment. Skeleton: No acute bony abnormality or aggressive osseous lesion. Cervical spondylosis with multilevel disc height loss, posterior disc osteophytes, uncovertebral and facet hypertrophy. Other neck: No neck mass or cervical lymphadenopathy. Upper chest: No consolidation within the imaged lung apices. Review of the MIP images confirms the above findings CTA HEAD FINDINGS Anterior circulation: The intracranial internal carotid arteries are patent bilaterally. Mild calcified plaque within these vessels without significant stenosis. There is abrupt occlusion of the proximal to mid M1 left middle cerebral artery (series 10, image 19). There is poor reconstitution of the more distal left MCA branches. The M1 right middle cerebral artery is patent without significant stenosis. No right M2 proximal branch occlusion or high-grade proximal stenosis is identified. The anterior cerebral arteries are patent bilaterally. Moderate focal stenosis within a left A3 ACA branch vessel. No intracranial aneurysm is identified Posterior circulation: The intracranial vertebral arteries are patent without significant stenosis, as is the basilar artery. The posterior cerebral arteries are patent bilaterally without significant proximal stenosis. A right posterior communicating artery is identified. Hypoplastic or absent left posterior communicating artery. Venous sinuses: Within limitations of contrast timing, no convincing thrombus. Anatomic variants: Hypoplastic A1 left ACA. Review of the MIP images confirms the above findings Findings of an M1 left MCA occlusion discussed with Dr. Lorraine Lax by telephone at 6:47 p.m. on 09/04/2019, who verbally acknowledged these results. IMPRESSION: CTA neck: The common carotid, internal carotid and vertebral arteries are patent within the neck without hemodynamically significant stenosis.  Atherosclerotic disease as described. CTA head: 1. Abrupt occlusion of the proximal/mid M1 left middle cerebral artery. There is poor opacification of the left MCA branch vessels more distally. 2. Moderate focal atherosclerotic narrowing of an A3 left ACA branch vessel. Electronically Signed   By: Kellie Simmering DO   On: 09/07/2019 19:09   CT HEAD WO CONTRAST  Addendum Date: 09/28/2019   ADDENDUM REPORT: 09/28/2019 08:47 ADDENDUM: These results were called by telephone at the time of interpretation on 09/28/2019 at 8:47 am to provider PRAMOD [facility] , who verbally acknowledged these results. Electronically Signed   By: Kellie Simmering DO   On: 09/28/2019 08:47   Result Date: 09/28/2019 CLINICAL DATA:  Stroke, follow-up. EXAM: CT HEAD WITHOUT CONTRAST TECHNIQUE: Contiguous axial images were obtained from the base of the skull through the vertex without intravenous contrast. COMPARISON:  Brain MRI 09/27/2019, CT angiogram head/neck 09/14/2019, noncontrast head CT 09/28/2019. FINDINGS: Brain: Again demonstrated is a large heterogeneous intraparenchymal hemorrhage centered within the left cerebral hemisphere within the left MCA vascular territory. Exact measurements are difficult to provide due to irregularity of the hematoma. However, the hematoma appears similar to slightly increased in size as compared to prior brain MRI 09/27/2019. Background fairly extensive edema within the left MCA vascular territory. Mass effect has increased again with significant effacement of the lateral and third ventricles. Interval increase in midline shift, now 11 mm (previously 9 mm). There is early rightward subfalcine herniation. There is also partial effacement of the basal cisterns on the left. Mild asymmetric prominence of the right lateral ventricle. Unchanged moderate volume intraventricular hemorrhage layering within the occipital horns of the lateral ventricles. Redemonstrated scattered small volume subarachnoid hemorrhage  overlying the bilateral cerebral hemispheres and left cerebellar folia. Redemonstrated chronic infarcts within the left cerebellum. Vascular: No definite hyperdense vessel. Atherosclerotic calcifications Skull: Normal. Negative for fracture or focal lesion. Sinuses/Orbits: Visualized  orbits show no acute finding. No significant paranasal sinus disease or mastoid effusion. IMPRESSION: A large heterogeneous intraparenchymal hemorrhage within the left cerebral hemisphere in the left MCA vascular territory is stable to slightly increased in size as compared to MRI 09/27/2019. Fairly extensive surrounding edema within the left MCA vascular territory. Increased mass effect with significant effacement of the left lateral and third ventricles. Rightward midline shift has increased, now 11 mm (previously 9 mm). There is early rightward subfalcine herniation. There is also partial effacement of the basal cisterns on the left. Unchanged moderate volume intraventricular hemorrhage within the occipital horns. Mild asymmetric prominence of the right lateral ventricle. Consider short interval CT follow-up to exclude developing entrapment. Redemonstrated scattered small volume subarachnoid hemorrhage overlying the bilateral cerebral hemispheres and along the left cerebellar folia. Electronically Signed: By: Kellie Simmering DO On: 09/28/2019 08:40   CT ANGIO NECK W OR WO CONTRAST  Result Date: 09/25/2019 CLINICAL DATA:  Stroke, follow-up. 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 Multiplanar CT image reconstructions and MIPs were obtained to evaluate the vascular anatomy. Carotid stenosis measurements (when applicable) are obtained utilizing NASCET criteria, using the distal internal carotid diameter as the denominator.  CONTRAST:  75 mL Omnipaque 350 intravenous contrast. COMPARISON:  Concurrently performed noncontrast head CT 09/11/2019. FINDINGS: CTA NECK FINDINGS Aortic arch: Standard aortic branching. Atherosclerotic plaque within the visualized aortic arch and proximal major branch vessels of the neck. No significant innominate or proximal subclavian artery stenosis. Right carotid system: CCA and ICA patent within the neck without significant stenosis (50% or greater). Minimal calcified plaque within the carotid bifurcation. Left carotid system: CCA and ICA patent within the neck without significant stenosis (50% or greater). Minimal calcified plaque within the carotid bifurcation and proximal ICA. Vertebral arteries: The vertebral arteries are patent within the neck bilaterally without significant stenosis (50% or greater). The left vertebral artery is dominant. Mild calcified plaque within both V1 segments and within the left V2 segment. Skeleton: No acute bony abnormality or aggressive osseous lesion. Cervical spondylosis with multilevel disc height loss, posterior disc osteophytes, uncovertebral and facet hypertrophy. Other neck: No neck mass or cervical lymphadenopathy. Upper chest: No consolidation within the imaged lung apices. Review of the MIP images confirms the above findings CTA HEAD FINDINGS Anterior circulation: The intracranial internal carotid arteries are patent bilaterally. Mild calcified plaque within these vessels without significant stenosis. There is abrupt occlusion of the proximal to mid M1 left middle cerebral artery (series 10, image 19). There is poor reconstitution of the more distal left MCA branches. The M1 right middle cerebral artery is patent without significant stenosis. No right M2 proximal branch occlusion or high-grade proximal stenosis is identified. The anterior cerebral arteries are patent bilaterally. Moderate focal stenosis within a left A3 ACA branch vessel. No intracranial aneurysm  is identified Posterior circulation: The intracranial vertebral arteries are patent without significant stenosis, as is the basilar artery. The posterior cerebral arteries are patent bilaterally without significant proximal stenosis. A right posterior communicating artery is identified. Hypoplastic or absent left posterior communicating artery. Venous sinuses: Within limitations of contrast timing, no convincing thrombus. Anatomic variants: Hypoplastic A1 left ACA. Review of the MIP images confirms the above findings Findings of an M1 left MCA occlusion discussed with Dr. Lorraine Lax by telephone at 6:47 p.m. on 09/25/2019, who verbally acknowledged  these results. IMPRESSION: CTA neck: The common carotid, internal carotid and vertebral arteries are patent within the neck without hemodynamically significant stenosis. Atherosclerotic disease as described. CTA head: 1. Abrupt occlusion of the proximal/mid M1 left middle cerebral artery. There is poor opacification of the left MCA branch vessels more distally. 2. Moderate focal atherosclerotic narrowing of an A3 left ACA branch vessel. Electronically Signed   By: Kellie Simmering DO   On: 09/11/2019 19:09   MR BRAIN WO CONTRAST  Addendum Date: 09/27/2019   ADDENDUM REPORT: 09/27/2019 15:55 ADDENDUM: Critical Value/emergent results were called by telephone at the time of interpretation on 09/27/2019 at 1540 hours to Dr. Royal Hawthorn, who verbally acknowledged these results. Electronically Signed   By: Genevie Ann M.D.   On: 09/27/2019 15:55   Result Date: 09/27/2019 CLINICAL DATA:  79 year old male with left MCA M1 occlusion status post endovascular revascularization yesterday. EXAM: MRI HEAD WITHOUT CONTRAST TECHNIQUE: Multiplanar, multiecho pulse sequences of the brain and surrounding structures were obtained without intravenous contrast. COMPARISON:  Code stroke head CT yesterday 1835 hours. Brain MRI 01/04/2014. FINDINGS: Brain: Large heterogeneous hemorrhage in the left  hemisphere encompassing 8.5 cm, corresponding to the MCA territory (series 5, image 69) is new from the pretreatment exam is yesterday. Associated intraventricular hemorrhage, moderate volume overall. Mass effect on the left lateral ventricle with no ventriculomegaly. But rightward midline shift of 9 mm. However, basilar cisterns remain patent at this time. Superimposed restricted diffusion and cytotoxic edema in the left MCA territory most apparent at the left caudate, inferior frontal gyrus and temporal lobe. See series 3, image 21. No superimposed subdural hematoma is evident, although there is a small volume of scattered subarachnoid hemorrhage, such as in the left cerebellar folia on axial FLAIR imaging. Additionally, there is probably chronic superficial siderosis over the right hemisphere (series 5, image 71) where right side subdural hematoma was noted on the 2015 MRI. No contralateral right hemisphere or posterior fossa restricted diffusion. Chronic left cerebellar infarcts have not significantly changed since 2015. Cervicomedullary junction and pituitary are within normal limits. Vascular: Major intracranial vascular flow voids are stable compared to 2015. Skull and upper cervical spine: Negative for age visible cervical spine. Visualized bone marrow signal is within normal limits. Sinuses/Orbits: Leftward gaze but otherwise negative orbits soft tissues. Paranasal sinuses are clear. Other: Small volume of fluid layering in the pharynx, the patient is intubated. Mastoids are clear. Visible internal auditory structures appear normal. IMPRESSION: 1. Positive for Malignant Hemorrhagic Transformation of Left MCA infarct. Bulky and confluent intra-axial hemorrhage with a moderate volume of intraventricular extension. No ventriculomegaly. 2. Rightward midline shift of 9 mm.  Basilar cisterns remain normal. 3. Small volume superimposed subarachnoid hemorrhage. No acute subdural blood identified. 4. Probable  chronic superficial siderosis over the right hemisphere related to 2015 subdural hematoma. 5. Chronic ischemic disease in the left cerebellum. Electronically Signed: By: Genevie Ann M.D. On: 09/27/2019 15:37   DG Chest Port 1 View  Result Date: 09/28/2019 CLINICAL DATA:  Stroke and intubation EXAM: PORTABLE CHEST 1 VIEW COMPARISON:  Yesterday FINDINGS: Endotracheal tube with tip just below the clavicular heads. Normal heart size and aortic contours. Extensive coronary stenting. There is no edema, consolidation, effusion, or pneumothorax. IMPRESSION: Endotracheal tube in good position. No evidence of acute cardiopulmonary disease. Electronically Signed   By: Monte Fantasia M.D.   On: 09/28/2019 08:20   Portable Chest xray  Result Date: 09/27/2019 CLINICAL DATA:  Stroke, intubation EXAM: PORTABLE CHEST 1 VIEW COMPARISON:  Portable exam 0542 hours compared to 09/11/2017 FINDINGS: Tip of endotracheal tube projects 3.9 cm above carina. Normal heart size, mediastinal contours, and pulmonary vascularity. Atherosclerotic calcification aorta. Chronic mild atelectasis versus scarring at LEFT base. Remaining lungs clear. No pleural effusion or pneumothorax. Bones demineralized. IMPRESSION: Satisfactory endotracheal tube position. Chronic mild atelectasis versus scarring at LEFT base without acute abnormalities. Electronically Signed   By: Lavonia Dana M.D.   On: 09/27/2019 08:35   ECHOCARDIOGRAM COMPLETE  Result Date: 09/27/2019    ECHOCARDIOGRAM REPORT   Patient Name:   Stephen [name] Date of Exam: 09/27/2019 Medical Rec #:  [id number]        Height:       68.0 in Accession #:    [id number]       Weight:       117.5 lb Date of Birth:  1940/10/25       BSA:          1.630 m Patient Age:    79 years         BP:           147/76 mmHg Patient Gender: M                HR:           63 bpm. Exam Location:  Inpatient Procedure: 2D Echo and Intracardiac Opacification Agent Indications:    Stroke 434.91 / I163.9  History:         Patient has prior history of Echocardiogram examinations, most                 recent 08/21/2019. CAD and Previous Myocardial Infarction; Risk                 Factors:Diabetes, Hypertension and Current Smoker. Hepatitis C.                 AAA.  Sonographer:    Darlina Sicilian RDCS Referring Phys: [id number] [place]  Sonographer Comments: Echo performed with patient supine and on artificial respirator. IMPRESSIONS  1. Anteroseptal and apical akinesis with overall severe LV dysfunction; grade 1 diastolic dsyfunction; no LV apical thrombus noted using definity; trace AI and MR.  2. Left ventricular ejection fraction, by estimation, is 25 to 30%. The left ventricle has severely decreased function. The left ventricle demonstrates regional wall motion abnormalities (see scoring diagram/findings for description). Left ventricular diastolic parameters are consistent with Grade I diastolic dysfunction (impaired relaxation).  3. Right ventricular systolic function is normal. The right ventricular size is normal.  4. The mitral valve is normal in structure. Trivial mitral valve regurgitation. No evidence of mitral stenosis.  5. The aortic valve is tricuspid. Aortic valve regurgitation is trivial. No aortic stenosis is present.  6. The inferior vena cava is normal in size with greater than 50% respiratory variability, suggesting right atrial pressure of 3 mmHg. FINDINGS  Left Ventricle: Left ventricular ejection fraction, by estimation, is 25 to 30%. The left ventricle has severely decreased function. The left ventricle demonstrates regional wall motion abnormalities. Definity contrast agent was given IV to delineate the left ventricular endocardial borders. The left ventricular internal cavity size was normal in size. There is no left ventricular hypertrophy. Left ventricular diastolic parameters are consistent with Grade I diastolic dysfunction (impaired relaxation). Right Ventricle: The right ventricular size is  normal. Right ventricular systolic function is normal. Left Atrium: Left atrial size was normal in size. Right Atrium: Right atrial size was normal in  size. Pericardium: There is no evidence of pericardial effusion. Mitral Valve: The mitral valve is normal in structure. Normal mobility of the mitral valve leaflets. Trivial mitral valve regurgitation. No evidence of mitral valve stenosis. Tricuspid Valve: The tricuspid valve is normal in structure. Tricuspid valve regurgitation is trivial. No evidence of tricuspid stenosis. Aortic Valve: The aortic valve is tricuspid. Aortic valve regurgitation is trivial. No aortic stenosis is present. Pulmonic Valve: The pulmonic valve was normal in structure. Pulmonic valve regurgitation is trivial. No evidence of pulmonic stenosis. Aorta: The aortic root is normal in size and structure. Venous: The inferior vena cava is normal in size with greater than 50% respiratory variability, suggesting right atrial pressure of 3 mmHg.  Additional Comments: Anteroseptal and apical akinesis with overall severe LV dysfunction; grade 1 diastolic dsyfunction; no LV apical thrombus noted using definity; trace AI and MR.  LEFT VENTRICLE PLAX 2D LVIDd:         4.70 cm      Diastology LVIDs:         3.70 cm      LV e' lateral:   2.61 cm/s LV PW:         1.00 cm      LV E/e' lateral: 13.1 LV IVS:        1.10 cm      LV e' medial:    3.05 cm/s LVOT diam:     1.70 cm      LV E/e' medial:  11.2 LV SV:         30 LV SV Index:   18 LVOT Area:     2.27 cm  LV Volumes (MOD) LV vol d, MOD A2C: 124.0 ml LV vol d, MOD A4C: 192.0 ml LV vol s, MOD A2C: 73.1 ml LV vol s, MOD A4C: 141.0 ml LV SV MOD A2C:     50.9 ml LV SV MOD A4C:     192.0 ml LV SV MOD BP:      59.0 ml RIGHT VENTRICLE             IVC RV S prime:     13.80 cm/s  IVC diam: 2.10 cm LEFT ATRIUM           Index LA diam:      2.30 cm 1.41 cm/m LA Vol (A2C): 34.6 ml 21.22 ml/m LA Vol (A4C): 39.6 ml 24.29 ml/m  AORTIC VALVE LVOT Vmax:   79.50 cm/s  LVOT Vmean:  44.900 cm/s LVOT VTI:    0.131 m  AORTA Ao Root diam: 3.20 cm MITRAL VALVE MV Area (PHT): 4.39 cm    SHUNTS MV Decel Time: 173 msec    Systemic VTI:  0.13 m MV E velocity: 34.10 cm/s  Systemic Diam: 1.70 cm MV A velocity: 82.50 cm/s MV E/A ratio:  0.41 Kirk Ruths MD Electronically signed by Kirk Ruths MD Signature Date/Time: 09/27/2019/1:53:43 PM    Final    CT HEAD CODE STROKE WO CONTRAST  Result Date: 09/10/2019 CLINICAL DATA:  Code stroke. EXAM: CT HEAD WITHOUT CONTRAST TECHNIQUE: Contiguous axial images were obtained from the base of the skull through the vertex without intravenous contrast. COMPARISON:  Noncontrast head CT 04/13/2014, brain MRI 01/04/2014 FINDINGS: Brain: There is abnormal hypodensity involving the left insula, left caudate and lentiform nuclei as well as left temporal lobe consistent with acute ischemic infarction changes. There is no significant mass effect or evidence of hemorrhagic conversion. No acute intracranial hemorrhage is identified. Redemonstrated small chronic cortically  based infarct within the left frontal operculum. Additional small chronic appearing cortically based infarcts within the right parietal and left occipital lobes. Redemonstrated chronic infarcts within the left cerebellar hemisphere. Stable background generalized parenchymal atrophy and chronic small vessel ischemic disease. No extra-axial fluid collection. No evidence of intracranial mass. No midline shift. Vascular: Hyperdensity in the region of the M1 left middle cerebral artery suggestive of thrombus. Skull: Normal. Negative for fracture or focal lesion. Sinuses/Orbits: Visualized orbits show no acute finding. No significant sinus disease or mastoid effusion at the imaged levels. ASPECTS ([place] Stroke Program Early CT Score) - Ganglionic level infarction (caudate, lentiform nuclei, internal capsule, insula, M1-M3 cortex): Six (points deducted for abnormal hypodensity involving the left  caudate nucleus, lentiform nucleus, insula and M2 territory) - Supraganglionic infarction (M4-M6 cortex): 3 Total score (0-10 with 10 being normal): 6 These results were called by telephone at the time of interpretation on 09/25/2019 at 6:47 pm to provider Dr. Lorraine Lax, who verbally acknowledged these results. IMPRESSION: 1. Hyperdensity of the M1 left middle cerebral artery suspicious for thrombus. 2. Acute ischemic infarction changes within the left MCA vascular territory. ASPECTS 6. 3. No evidence of acute intracranial hemorrhage. 4. Multiple small chronic cortically based infarcts within the bilateral cerebral hemispheres. 5. Redemonstrated chronic infarcts within the left cerebellum. 6. Stable background generalized parenchymal atrophy and chronic small vessel ischemic disease. Electronically Signed   By: Kellie Simmering DO   On: 09/11/2019 18:52    Labs:  CBC: Recent Labs    09/08/2019 1832 09/07/2019 1833 09/06/2019 2155 09/27/19 0501 09/27/19 1603 09/28/19 0803  WBC 7.4  --   --  10.4 11.4* 11.5*  HGB 8.4*   < > 6.5* 10.2* 8.8* 7.5*  HCT 27.6*   < > 19.0* 31.4* 26.6* 23.7*  PLT 146*  --   --  PLATELET CLUMPS NOTED ON SMEAR, UNABLE TO ESTIMATE PLATELET CLUMPS NOTED ON SMEAR, UNABLE TO ESTIMATE 89*   < > = values in this interval not displayed.    COAGS: Recent Labs    05/03/19 0848 09/07/2019 1832  INR 1.2* 1.3*  APTT  --  34    BMP: Recent Labs    09/29/2019 1832 09/19/2019 1832 09/03/2019 1833 09/01/2019 1833 09/06/2019 2155 09/04/2019 2155 09/27/19 0501 09/27/19 1603 09/27/19 2149 09/28/19 0803  NA 140   < > 139   < > 138   < > 137 141  142 145 152*  K 4.6   < > 4.5   < > 5.2*  --  5.4* 4.9  --  4.3  CL 109   < > 116*  --   --   --  111 116*  --  128*  CO2 20*  --   --   --   --   --  18* 20*  --  16*  GLUCOSE 156*   < > 145*  --   --   --  178* 119*  --  143*  BUN 41*   < > 36*  --   --   --  40* 42*  --  42*  CALCIUM 8.9  --   --   --   --   --  8.2* 8.3*  --  8.2*  CREATININE  2.20*   < > 2.00*  --   --   --  1.92* 2.11*  --  1.94*  GFRNONAA 27*  --   --   --   --   --  32*  29*  --  32*  GFRAA 32*  --   --   --   --   --  38* 33*  --  37*   < > = values in this interval not displayed.    LIVER FUNCTION TESTS: Recent Labs    08/16/19 1215 08/16/19 1215 08/19/19 2144 08/21/19 0313 08/23/19 0310 08/30/19 1025 09/06/19 1018 09/21/2019 1832  BILITOT 0.7  --  0.7  --   --  0.5  --  0.6  AST 113*  --  64*  --   --  33  --  22  ALT 117*  --  80*  --   --  39  --  21  ALKPHOS 104  --  98  --   --  67  --  76  PROT 5.6*  --  6.1*  --   --  5.8*  --  6.7  ALBUMIN 2.7*   < > 2.1*   < > 1.7* 2.5* 2.8* 2.9*   < > = values in this interval not displayed.    Assessment and Plan:  History of CVA s/p cerebral arteriogram with emergent mechanical thrombectomy of left MCA M1 occlusion achieving a TICI 3 revascularization 09/12/2019 by Dr. Estanislado Pandy. Patient's condition stable- intubated/sedated, opens eyes to voice, follows some simple commands (moves LUE to command only), can spontaneously move left side with no spontaneous movements of right side. Left groin incision stable. Further plans per neurology/CCM/vascular surgery- appreciate and agree with management. Please call NIR with questions/concerns.   Electronically Signed: Earley Abide, PA-C 09/28/2019, 9:32 AM   I spent a total of 25 Minutes at the the patient's bedside AND on the patient's hospital floor or unit, greater than 50% of which was counseling/coordinating care for left MCA M1 occlusion s/p revascularization.

## 2019-09-28 NOTE — Patient Care Conference (Deleted)
    Updated status after discussion with family   RT extubated patient to comfort per MD order and family wishes. RN and NP at bedside

## 2019-09-29 DIAGNOSIS — I63512 Cerebral infarction due to unspecified occlusion or stenosis of left middle cerebral artery: Secondary | ICD-10-CM | POA: Diagnosis not present

## 2019-09-29 NOTE — Progress Notes (Signed)
Nutrition Brief Note  Chart reviewed. Pt now transitioning to comfort care.  No further nutrition interventions warranted at this time.  Please re-consult as needed.   Issaih Kaus W, RD, LDN, CDCES Registered Dietitian II Certified Diabetes Care and Education Specialist Please refer to AMION for RD and/or RD on-call/weekend/after hours pager  

## 2019-09-29 NOTE — Social Work (Signed)
CSW acknowledging pt arrival to floor under comfort care. Will follow for disposition should hospice services or residential hospice become appropriate.   Alexander Mt, MSW, [place] [facility]
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
## 2019-09-30 ENCOUNTER — Other Ambulatory Visit (HOSPITAL_COMMUNITY): Payer: Medicare Other

## 2019-09-30 DIAGNOSIS — I63512 Cerebral infarction due to unspecified occlusion or stenosis of left middle cerebral artery: Secondary | ICD-10-CM | POA: Diagnosis not present

## 2019-10-01 NOTE — Progress Notes (Signed)
Wife Santiago Glad notified via telephone of her husbands passing, does not wish to come in. Will continue post mortem care
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
## 2019-10-01 NOTE — Progress Notes (Signed)
All Lines and catheter removed, 20CC of morphine wasted with Nurse Ze

## 2019-10-01 NOTE — Progress Notes (Signed)
[place] called for referral. Body is not suitable for any organ donation per Murlean Caller with referral no. [id number].

## 2019-10-01 DEATH — deceased

## 2019-10-04 ENCOUNTER — Ambulatory Visit (HOSPITAL_COMMUNITY): Admission: RE | Admit: 2019-10-04 | Payer: Medicare Other | Source: Home / Self Care | Admitting: Surgery

## 2019-10-04 ENCOUNTER — Encounter (HOSPITAL_COMMUNITY): Admission: RE | Payer: Self-pay | Source: Home / Self Care

## 2019-10-04 SURGERY — ABDOMINAL AORTOGRAM W/LOWER EXTREMITY
Anesthesia: LOCAL

## 2019-10-05 ENCOUNTER — Telehealth: Payer: Self-pay | Admitting: Family Medicine

## 2019-10-05 NOTE — Telephone Encounter (Signed)
Poke with wife, Santiago Glad,  expressed my condolences.

## 2019-11-11 ENCOUNTER — Other Ambulatory Visit (HOSPITAL_COMMUNITY): Payer: Medicare Other

## 2019-11-30 ENCOUNTER — Ambulatory Visit: Payer: Medicare Other | Admitting: Family Medicine

## 2020-11-27 IMAGING — CT CT HEAD W/O CM
3 of 4 series · 13 of 47 positions shown, 15 images · non-contrast
Comparison: Brain MRI 09/27/2019, CT angiogram head/neck
09/26/2019, noncontrast head CT 09/26/2019.
COMPARISON: Brain MRI 09/27/2019, CT angiogram head/neck
09/26/2019, noncontrast head CT 09/26/2019.

Addendum:
CLINICAL DATA: Stroke, follow-up.

EXAM:
CT HEAD WITHOUT CONTRAST
TECHNIQUE: Contiguous axial images were obtained from the base of the skull
through the vertex without intravenous contrast.

[Series 3: head without · axial · non-contrast · 0.46mm/px · z∈[-127,-7]mm · 7 of 33 slices shown, 9 images]
[im 5/33  brain]
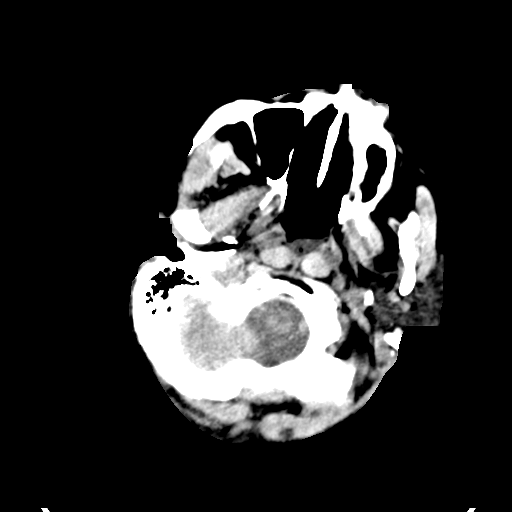
[im 5/33  bone]
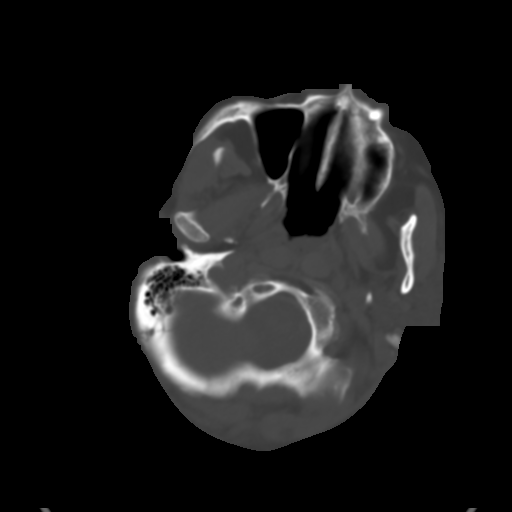
[im 9/33  brain]
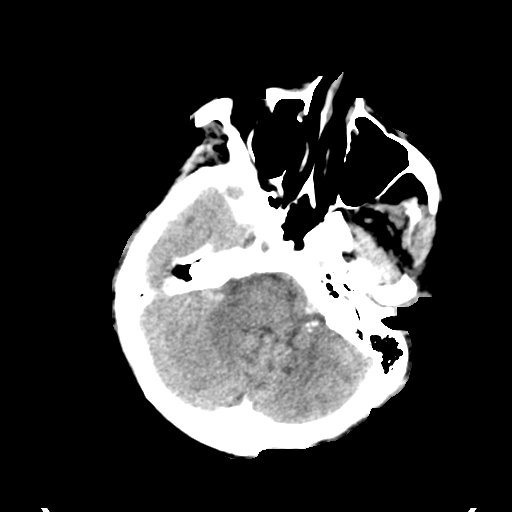
[im 13/33  brain]
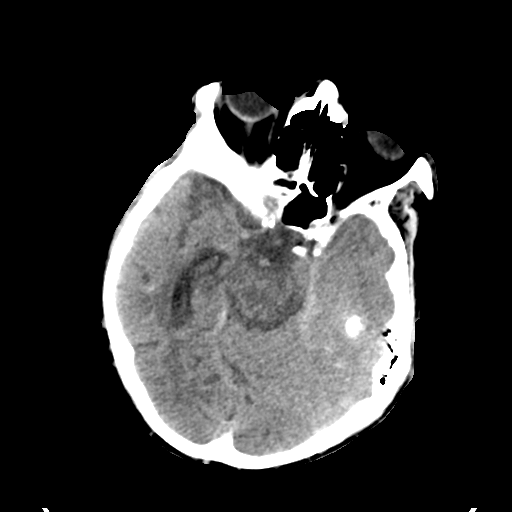
[im 17/33  brain]
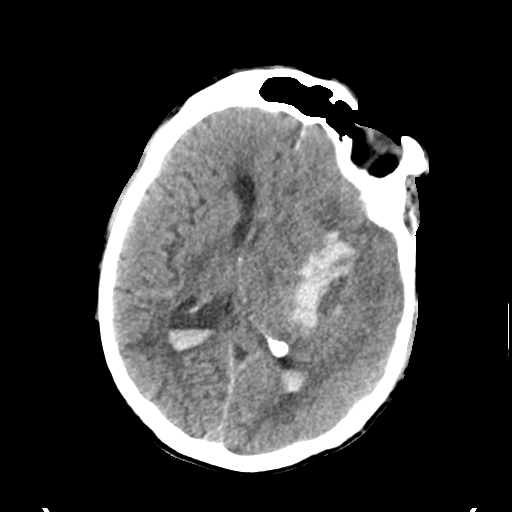
[im 21/33  brain]
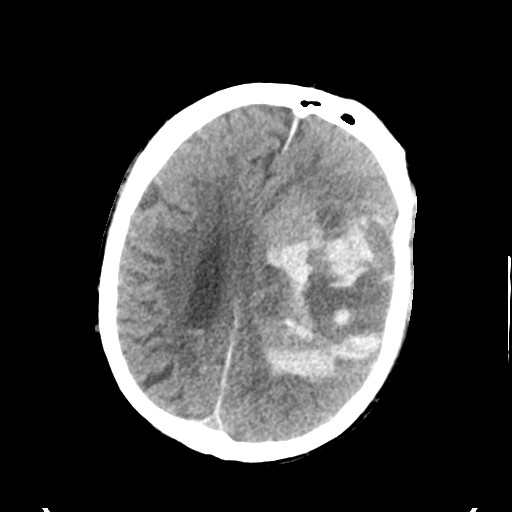
[im 21/33  bone]
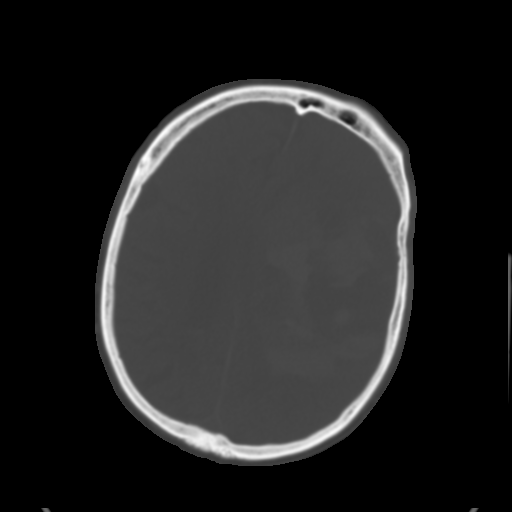
[im 25/33  brain]
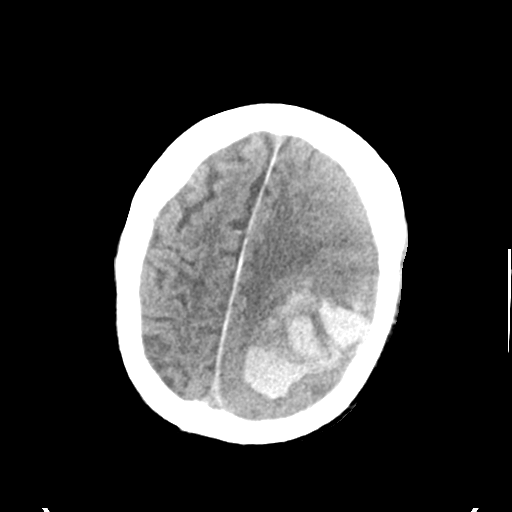
[im 29/33  brain]
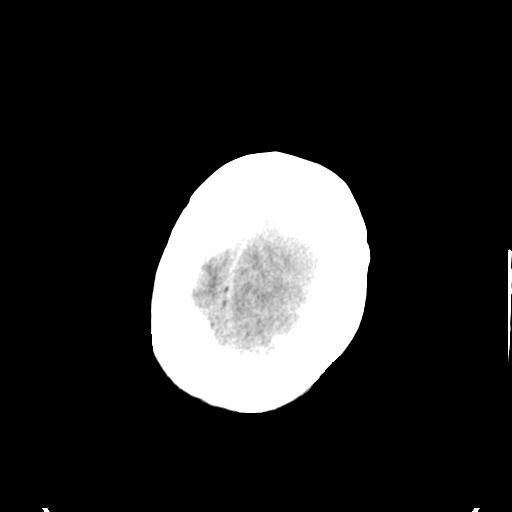

[Series 5: head without cor · coronal · non-contrast · 0.32mm/px · 3 of 68 slices shown]
[im 23/68  brain]
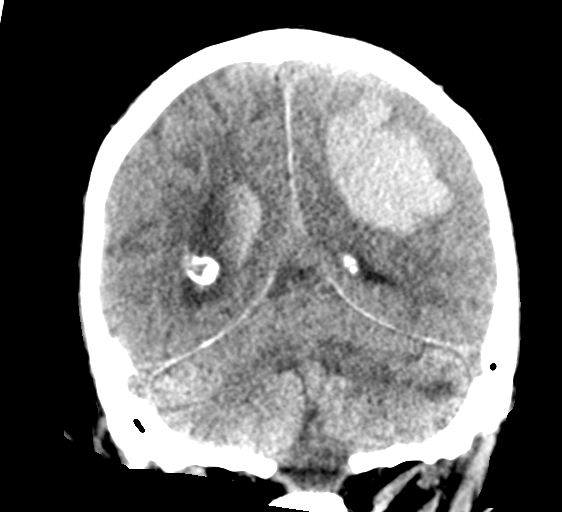
[im 30/68  brain]
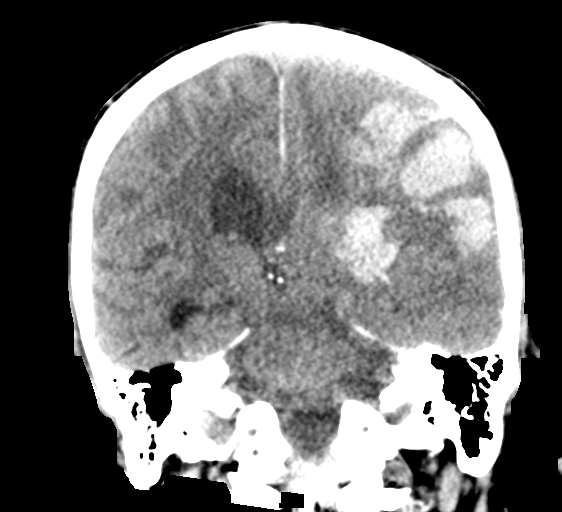
[im 38/68  brain]
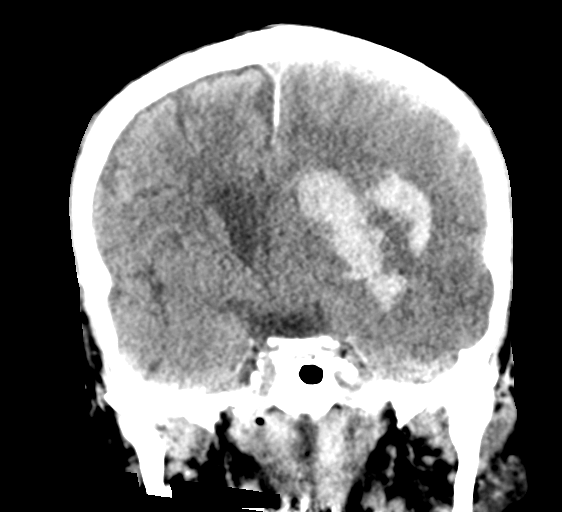

[Series 6: head without sag · sagittal · non-contrast · 0.33mm/px · 3 of 57 slices shown]
[im 22/57  brain]
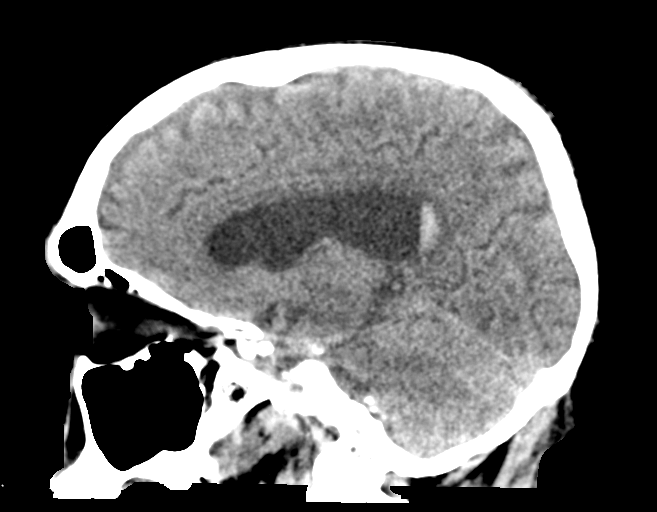
[im 29/57  brain]
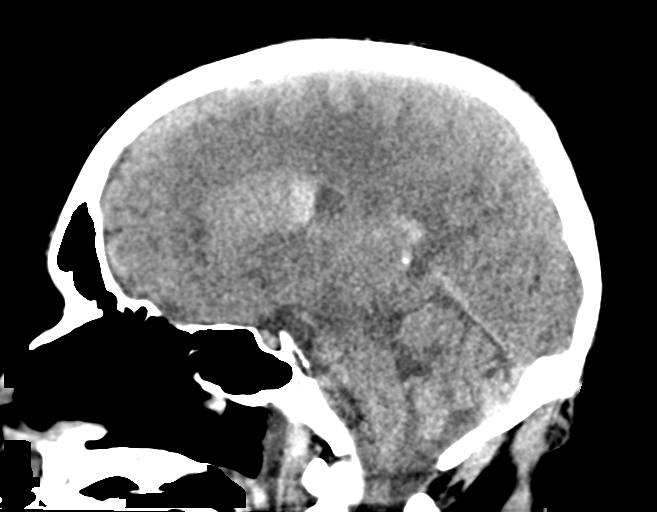
[im 35/57  brain]
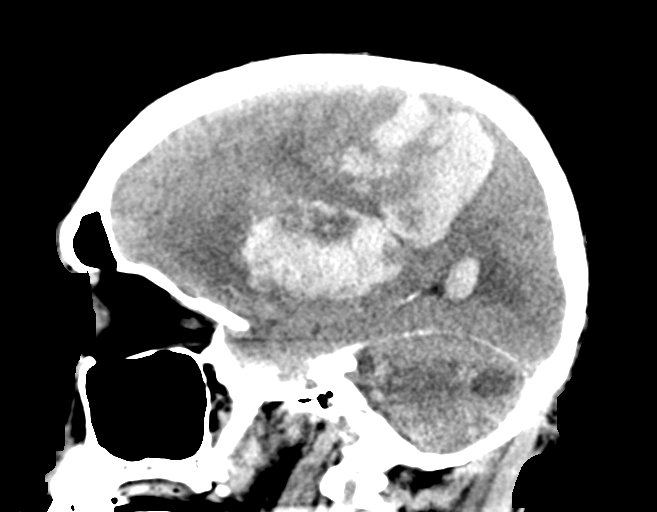

[13 of 47 positions shown; findings below may reference images not displayed]

FINDINGS: Brain:

Again demonstrated is a large heterogeneous intraparenchymal
hemorrhage centered within the left cerebral hemisphere within the
left MCA vascular territory. Exact measurements are difficult to
provide due to irregularity of the hematoma. However, the hematoma
appears similar to slightly increased in size as compared to prior
brain MRI 09/27/2019. Background fairly extensive edema within the
left MCA vascular territory. Mass effect has increased again with
significant effacement of the lateral and third ventricles. Interval
increase in midline shift, now 11 mm (previously 9 mm). There is
early rightward subfalcine herniation. There is also partial
effacement of the basal cisterns on the left.

Mild asymmetric prominence of the right lateral ventricle. Unchanged
moderate volume intraventricular hemorrhage layering within the
occipital horns of the lateral ventricles. Redemonstrated scattered
small volume subarachnoid hemorrhage overlying the bilateral
cerebral hemispheres and left cerebellar folia.

Redemonstrated chronic infarcts within the left cerebellum.

Vascular: No definite hyperdense vessel. Atherosclerotic
calcifications

Skull: Normal. Negative for fracture or focal lesion.

Sinuses/Orbits: Visualized orbits show no acute finding. No
significant paranasal sinus disease or mastoid effusion.
IMPRESSION: A large heterogeneous intraparenchymal hemorrhage within the left
cerebral hemisphere in the left MCA vascular territory is stable to
slightly increased in size as compared to MRI 09/27/2019. Fairly
extensive surrounding edema within the left MCA vascular territory.

Increased mass effect with significant effacement of the left
lateral and third ventricles. Rightward midline shift has increased,
now 11 mm (previously 9 mm). There is early rightward subfalcine
herniation. There is also partial effacement of the basal cisterns
on the left.

Unchanged moderate volume intraventricular hemorrhage within the
occipital horns.

Mild asymmetric prominence of the right lateral ventricle. Consider
short interval CT follow-up to exclude developing entrapment.

Redemonstrated scattered small volume subarachnoid hemorrhage
overlying the bilateral cerebral hemispheres and along the left
cerebellar folia.

ADDENDUM:
These results were called by telephone at the time of interpretation
on 09/28/2019 at [DATE] to provider LUIS ENRIQUE IN , who verbally
acknowledged these results.

*** End of Addendum ***
FINDINGS: Brain:

Again demonstrated is a large heterogeneous intraparenchymal
hemorrhage centered within the left cerebral hemisphere within the
left MCA vascular territory. Exact measurements are difficult to
provide due to irregularity of the hematoma. However, the hematoma
appears similar to slightly increased in size as compared to prior
brain MRI 09/27/2019. Background fairly extensive edema within the
left MCA vascular territory. Mass effect has increased again with
significant effacement of the lateral and third ventricles. Interval
increase in midline shift, now 11 mm (previously 9 mm). There is
early rightward subfalcine herniation. There is also partial
effacement of the basal cisterns on the left.

Mild asymmetric prominence of the right lateral ventricle. Unchanged
moderate volume intraventricular hemorrhage layering within the
occipital horns of the lateral ventricles. Redemonstrated scattered
small volume subarachnoid hemorrhage overlying the bilateral
cerebral hemispheres and left cerebellar folia.

Redemonstrated chronic infarcts within the left cerebellum.

Vascular: No definite hyperdense vessel. Atherosclerotic
calcifications

Skull: Normal. Negative for fracture or focal lesion.

Sinuses/Orbits: Visualized orbits show no acute finding. No
significant paranasal sinus disease or mastoid effusion.
IMPRESSION: A large heterogeneous intraparenchymal hemorrhage within the left
cerebral hemisphere in the left MCA vascular territory is stable to
slightly increased in size as compared to MRI 09/27/2019. Fairly
extensive surrounding edema within the left MCA vascular territory.

Increased mass effect with significant effacement of the left
lateral and third ventricles. Rightward midline shift has increased,
now 11 mm (previously 9 mm). There is early rightward subfalcine
herniation. There is also partial effacement of the basal cisterns
on the left.

Unchanged moderate volume intraventricular hemorrhage within the
occipital horns.

Mild asymmetric prominence of the right lateral ventricle. Consider
short interval CT follow-up to exclude developing entrapment.

Redemonstrated scattered small volume subarachnoid hemorrhage
overlying the bilateral cerebral hemispheres and along the left
cerebellar folia.
